# Patient Record
Sex: Female | Born: 1970 | Race: Black or African American | Hispanic: No | Smoking: Never smoker
Health system: Southern US, Community
[De-identification: ages and names within clinical notes are randomized; demographics above are authoritative.]

## PROBLEM LIST (undated history)

## (undated) DIAGNOSIS — F259 Schizoaffective disorder, unspecified: Principal | ICD-10-CM

## (undated) DIAGNOSIS — I10 Essential (primary) hypertension: Secondary | ICD-10-CM

## (undated) HISTORY — DX: Essential (primary) hypertension: I10

## (undated) HISTORY — DX: Schizoaffective disorder, unspecified: F25.9

---
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
## 2001-03-12 DIAGNOSIS — F259 Schizoaffective disorder, unspecified: Secondary | ICD-10-CM

## 2001-03-12 HISTORY — DX: Schizoaffective disorder, unspecified: F25.9

## 2002-10-06 ENCOUNTER — Emergency Department (HOSPITAL_COMMUNITY): Admission: EM | Admit: 2002-10-06 | Discharge: 2002-10-06 | Payer: Self-pay | Admitting: Emergency Medicine

## 2004-02-14 ENCOUNTER — Ambulatory Visit: Payer: Self-pay | Admitting: Family Medicine

## 2004-02-15 ENCOUNTER — Ambulatory Visit: Payer: Self-pay | Admitting: *Deleted

## 2004-03-20 ENCOUNTER — Ambulatory Visit (HOSPITAL_COMMUNITY): Admission: RE | Admit: 2004-03-20 | Discharge: 2004-03-20 | Payer: Self-pay | Admitting: Family Medicine

## 2004-10-31 ENCOUNTER — Inpatient Hospital Stay (HOSPITAL_COMMUNITY): Admission: AD | Admit: 2004-10-31 | Discharge: 2004-11-06 | Payer: Self-pay | Admitting: Psychiatry

## 2004-10-31 ENCOUNTER — Ambulatory Visit: Payer: Self-pay | Admitting: Psychiatry

## 2005-06-04 ENCOUNTER — Ambulatory Visit: Payer: Self-pay | Admitting: Family Medicine

## 2005-07-09 ENCOUNTER — Encounter (INDEPENDENT_AMBULATORY_CARE_PROVIDER_SITE_OTHER): Payer: Self-pay | Admitting: Family Medicine

## 2005-07-09 ENCOUNTER — Ambulatory Visit: Payer: Self-pay | Admitting: Family Medicine

## 2006-07-18 ENCOUNTER — Ambulatory Visit: Payer: Self-pay | Admitting: Family Medicine

## 2006-07-18 ENCOUNTER — Encounter (INDEPENDENT_AMBULATORY_CARE_PROVIDER_SITE_OTHER): Payer: Self-pay | Admitting: Family Medicine

## 2006-12-26 ENCOUNTER — Ambulatory Visit: Payer: Self-pay | Admitting: Internal Medicine

## 2008-04-20 ENCOUNTER — Ambulatory Visit: Payer: Self-pay | Admitting: Family Medicine

## 2008-04-20 LAB — CONVERTED CEMR LAB
Basophils Absolute: 0 10*3/uL (ref 0.0–0.1)
Basophils Relative: 1 % (ref 0–1)
CO2: 21 meq/L (ref 19–32)
Calcium: 9.6 mg/dL (ref 8.4–10.5)
Cholesterol: 244 mg/dL — ABNORMAL HIGH (ref 0–200)
Eosinophils Relative: 1 % (ref 0–5)
HCT: 39.7 % (ref 36.0–46.0)
HDL: 68 mg/dL (ref >39)
Lymphocytes Relative: 46 % (ref 12–46)
MCHC: 32.7 g/dL (ref 30.0–36.0)
Platelets: 352 10*3/uL (ref 150–400)
RDW: 13.5 % (ref 11.5–15.5)
Sodium: 140 meq/L (ref 135–145)
Total CHOL/HDL Ratio: 3.6
Triglycerides: 79 mg/dL (ref <150)

## 2008-07-29 ENCOUNTER — Encounter (INDEPENDENT_AMBULATORY_CARE_PROVIDER_SITE_OTHER): Payer: Self-pay | Admitting: Family Medicine

## 2008-07-29 ENCOUNTER — Ambulatory Visit: Payer: Self-pay | Admitting: Family Medicine

## 2008-08-04 ENCOUNTER — Ambulatory Visit (HOSPITAL_COMMUNITY): Admission: RE | Admit: 2008-08-04 | Discharge: 2008-08-04 | Payer: Self-pay | Admitting: Family Medicine

## 2008-12-08 ENCOUNTER — Ambulatory Visit: Payer: Self-pay | Admitting: Family Medicine

## 2008-12-08 LAB — CONVERTED CEMR LAB
HDL: 63 mg/dL (ref >39)
LDL Cholesterol: 153 mg/dL — ABNORMAL HIGH (ref 0–99)
Total CHOL/HDL Ratio: 3.7
VLDL: 17 mg/dL (ref 0–40)

## 2009-08-09 ENCOUNTER — Ambulatory Visit (HOSPITAL_COMMUNITY): Admission: RE | Admit: 2009-08-09 | Discharge: 2009-08-09 | Payer: Self-pay | Admitting: Family Medicine

## 2009-08-18 ENCOUNTER — Ambulatory Visit: Payer: Self-pay | Admitting: Psychiatry

## 2009-08-18 ENCOUNTER — Inpatient Hospital Stay (HOSPITAL_COMMUNITY): Admission: EM | Admit: 2009-08-18 | Discharge: 2009-08-26 | Payer: Self-pay | Admitting: Psychiatry

## 2009-08-18 ENCOUNTER — Emergency Department (HOSPITAL_COMMUNITY): Admission: EM | Admit: 2009-08-18 | Discharge: 2009-08-18 | Payer: Self-pay | Admitting: Emergency Medicine

## 2010-05-29 LAB — POCT I-STAT, CHEM 8
Creatinine, Ser: 1 mg/dL (ref 0.4–1.2)
Hemoglobin: 14.3 g/dL (ref 12.0–15.0)
Potassium: 4 mEq/L (ref 3.5–5.1)
Sodium: 138 mEq/L (ref 135–145)
TCO2: 18 mmol/L (ref 0–100)

## 2010-05-29 LAB — URINALYSIS, ROUTINE W REFLEX MICROSCOPIC
Ketones, ur: 15 mg/dL — AB
Nitrite: NEGATIVE
Protein, ur: NEGATIVE mg/dL
Urobilinogen, UA: 0.2 mg/dL (ref 0.0–1.0)

## 2010-05-29 LAB — CBC
HCT: 41.5 % (ref 36.0–46.0)
Platelets: 316 10*3/uL (ref 150–400)
RDW: 12.9 % (ref 11.5–15.5)
WBC: 3.9 10*3/uL — ABNORMAL LOW (ref 4.0–10.5)

## 2010-05-29 LAB — DIFFERENTIAL
Basophils Absolute: 0.1 10*3/uL (ref 0.0–0.1)
Eosinophils Absolute: 0 10*3/uL (ref 0.0–0.7)
Eosinophils Relative: 0 % (ref 0–5)
Lymphocytes Relative: 34 % (ref 12–46)
Lymphs Abs: 1.3 10*3/uL (ref 0.7–4.0)
Neutrophils Relative %: 55 % (ref 43–77)

## 2010-05-29 LAB — RAPID URINE DRUG SCREEN, HOSP PERFORMED
Amphetamines: NOT DETECTED
Benzodiazepines: NOT DETECTED
Cocaine: NOT DETECTED

## 2010-05-29 LAB — POCT PREGNANCY, URINE: Preg Test, Ur: NEGATIVE

## 2010-05-29 LAB — ETHANOL: Alcohol, Ethyl (B): <5 mg/dL (ref 0–10)

## 2010-05-29 LAB — URINE MICROSCOPIC-ADD ON

## 2010-07-12 ENCOUNTER — Other Ambulatory Visit (HOSPITAL_COMMUNITY): Payer: Self-pay | Admitting: Family Medicine

## 2010-07-12 DIAGNOSIS — Z1231 Encounter for screening mammogram for malignant neoplasm of breast: Secondary | ICD-10-CM
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
## 2010-08-14 ENCOUNTER — Ambulatory Visit (HOSPITAL_COMMUNITY)
Admission: RE | Admit: 2010-08-14 | Discharge: 2010-08-14 | Disposition: A | Discharge status: Home / Self Care | Payer: Self-pay | Source: Ambulatory Visit | Attending: Family Medicine | Admitting: Family Medicine

## 2010-08-14 DIAGNOSIS — Z1231 Encounter for screening mammogram for malignant neoplasm of breast: Secondary | ICD-10-CM | POA: Insufficient documentation

## 2010-09-26 ENCOUNTER — Other Ambulatory Visit: Payer: Self-pay | Admitting: Family Medicine

## 2012-01-03 ENCOUNTER — Encounter: Payer: Self-pay | Admitting: Family Medicine

## 2012-01-03 ENCOUNTER — Ambulatory Visit (INDEPENDENT_AMBULATORY_CARE_PROVIDER_SITE_OTHER): Payer: Self-pay | Admitting: Family Medicine

## 2012-01-03 VITALS — BP 164/101 | HR 99 | Temp 98.4°F | Ht 65.0 in | Wt 149.0 lb

## 2012-01-03 DIAGNOSIS — R030 Elevated blood-pressure reading, without diagnosis of hypertension: Secondary | ICD-10-CM

## 2012-01-03 DIAGNOSIS — Z83438 Family history of other disorder of lipoprotein metabolism and other lipidemia: Secondary | ICD-10-CM

## 2012-01-03 DIAGNOSIS — R9431 Abnormal electrocardiogram [ECG] [EKG]: Secondary | ICD-10-CM

## 2012-01-03 DIAGNOSIS — Z8349 Family history of other endocrine, nutritional and metabolic diseases: Secondary | ICD-10-CM

## 2012-01-03 DIAGNOSIS — F259 Schizoaffective disorder, unspecified: Secondary | ICD-10-CM

## 2012-01-03 DIAGNOSIS — E785 Hyperlipidemia, unspecified: Secondary | ICD-10-CM | POA: Insufficient documentation

## 2012-01-03 NOTE — Assessment & Plan Note (Signed)
I have asked patient to monitor at home or when she is at the drug store.  We will re-assess next visit, if it remains high will consider starting BP medications.

## 2012-01-03 NOTE — Assessment & Plan Note (Signed)
Per patient report well controlled on Risperdal, management per [facility].

## 2012-01-03 NOTE — Assessment & Plan Note (Signed)
Likely needs lipid panel due to family history, will plan on lab work next visit after she gets the orange card.

## 2012-01-03 NOTE — Progress Notes (Signed)
  Subjective:    Patient ID: Sandra Bell, female    DOB: 1970-11-29, 41 y.o.   MRN: [id number]  HPI  Sandra Bell comes in to establish care.  She used to see [facility] and needs a new doctor.  She is going to apply for the orange card as she does not have insurance.   She takes Risperdal for Schizoaffective disorder and is treated by [facility].  She says she is doing well and has been stable on this medication for a while.    She says she had an ECG done at some point that had some abnormalities, and they told her it looked like she might have had a hear attack in the past.  She denies ever having symptoms of a heart attack.  The doctor at that time started her on a baby aspirin daily.   She has never had problems with her blood pressure running high- but does admit to being nervous today about meeting a new doctor. Both her parents have high blood pressure.  She denies chest pain, dyspnea, palpitations, lower extremity edema.   Both her parents have high cholesterol, she does not remember the last time her cholesterol was checked.   Past Medical History  Diagnosis Date  . Schizoaffective disorder 2003   Family History  Problem Relation Age of Onset  . Hyperlipidemia Mother   . Hypertension Mother   . Hyperlipidemia Father   . Hypertension Father   . Heart disease Father    History  Substance Use Topics  . Smoking status: Never Smoker   . Smokeless tobacco: Never Used  . Alcohol Use: No     Review of Systems  Constitutional: Negative for unexpected weight change.  HENT: Negative for hearing loss.   Eyes: Negative for visual disturbance.  Respiratory: Negative for shortness of breath.   Cardiovascular: Negative for chest pain.  Gastrointestinal: Negative for abdominal pain.  Genitourinary: Negative for dysuria.  Musculoskeletal: Negative for arthralgias.  Skin: Negative for rash.  Neurological: Negative for headaches.  Hematological: Does not bruise/bleed easily.    Psychiatric/Behavioral: Negative for hallucinations.       Objective:   Physical Exam BP 164/101  Pulse 99  Temp 98.4 F (36.9 C) (Oral)  Ht 5\' 5"  (1.651 m)  Wt 149 lb (67.586 kg)  BMI 24.79 kg/m2  LMP 05/06/2011 General appearance: alert, cooperative and no distress Neck: supple, symmetrical, trachea midline and thyroid not enlarged, symmetric, no tenderness/mass/nodules Lungs: clear to auscultation bilaterally Heart: regular rate and rhythm, S1, S2 normal, no murmur, click, rub or gallop Extremities: extremities normal, atraumatic, no cyanosis or edema Pulses: 2+ and symmetric        Assessment & Plan:

## 2012-01-03 NOTE — Patient Instructions (Signed)
It was nice to meet you.  Your blood pressure today was BP: 164/101 mmHg.  Remember your goal blood pressure is about 130/80.  I am not sure if this is because you are nervous today, but we will need to monitor it to make sure you are not developing high blood pressure.    Please make an appointment to see me in about a month after you get the orange card.  If you can, make a morning appointment and come in fasting so we can check your cholesterol and other blood work.

## 2012-01-03 NOTE — Assessment & Plan Note (Signed)
Suspect this is a normal variant considering no medical history or symptoms.  Will consider repeating an ECG next visit.

## 2012-02-05 ENCOUNTER — Ambulatory Visit (INDEPENDENT_AMBULATORY_CARE_PROVIDER_SITE_OTHER): Payer: Self-pay | Admitting: Family Medicine

## 2012-02-05 VITALS — BP 175/123 | HR 109 | Ht 65.0 in | Wt 147.0 lb

## 2012-02-05 DIAGNOSIS — Z83438 Family history of other disorder of lipoprotein metabolism and other lipidemia: Secondary | ICD-10-CM

## 2012-02-05 DIAGNOSIS — Z8349 Family history of other endocrine, nutritional and metabolic diseases: Secondary | ICD-10-CM

## 2012-02-05 DIAGNOSIS — I10 Essential (primary) hypertension: Secondary | ICD-10-CM | POA: Insufficient documentation

## 2012-02-05 MED ORDER — HYDROCHLOROTHIAZIDE 12.5 MG PO CAPS: 12.5000 mg | ORAL_CAPSULE | Freq: Every day | ORAL | Status: DC

## 2012-02-05 NOTE — Progress Notes (Signed)
  Subjective:    Patient ID: Sandra Bell, female    DOB: [birth year], 41 y.o.   MRN: [id number]  HPI  Sandra Bell comes in for follow up.  She says she is doing well, and she is looking forward to the [place], she and her parents will have family in town for Thanksgiving.  She has not been able to get the orange card yet, but is on the waiting list.   HTN- she has never had high blood pressrue that she knows of, but both her parents have it and take medications.  She denies an chest pain, dyspnea, LE swelling, palpitations.  She has continued to do her regular exercise routine.   Past Medical History  Diagnosis Date  . Schizoaffective disorder 2003   Family History  Problem Relation Age of Onset  . Hyperlipidemia Mother   . Hypertension Mother   . Hyperlipidemia Father   . Hypertension Father   . Heart disease Father    History  Substance Use Topics  . Smoking status: Never Smoker   . Smokeless tobacco: Never Used  . Alcohol Use: No   Review of Systems See HPI    Objective:   Physical Exam BP 175/123  Pulse 109  Ht 5\' 5"  (1.651 m)  Wt 147 lb (66.679 kg)  BMI 24.46 kg/m2 General appearance: alert, cooperative and no distress Eyes: conjunctivae/corneas clear. PERRL, EOM's intact. Fundi benign. Neck: no adenopathy, no JVD and supple, symmetrical, trachea midline Lungs: clear to auscultation bilaterally Extremities: extremities normal, atraumatic, no cyanosis or edema       Assessment & Plan:

## 2012-02-05 NOTE — Assessment & Plan Note (Signed)
Plan on checking lipids when she gets the orange card, future orders in.

## 2012-02-05 NOTE — Assessment & Plan Note (Signed)
Repeat BP was 150/90.  Will start HCTZ, and check bmet when she is able to get the orange card.  F/U in 3 months.

## 2012-02-05 NOTE — Patient Instructions (Signed)
It was good to see you.  Your blood pressure is running high, it was high last visit too.  Today it was 150/85, the first reading was 175/123.  I think you need to start a blood pressure medication, I have sent a prescription to [facility] for HCTZ.  Please take it every morning.    For your lab work, once you get the [facility] card, call the office and ask to make a lab appointment.  Please come in fasting (you can drink water, black coffee, and brush your teeth).  I will send you a letter with your lab results, or call you if anything is abnormal.

## 2012-03-06 ENCOUNTER — Other Ambulatory Visit: Payer: No Typology Code available for payment source

## 2012-03-06 DIAGNOSIS — I10 Essential (primary) hypertension: Secondary | ICD-10-CM

## 2012-03-06 LAB — BASIC METABOLIC PANEL
BUN: 10 mg/dL (ref 6–23)
CO2: 28 mEq/L (ref 19–32)
Chloride: 102 mEq/L (ref 96–112)
Creat: 1.12 mg/dL — ABNORMAL HIGH (ref 0.50–1.10)
Glucose, Bld: 90 mg/dL (ref 70–99)
Potassium: 3.9 mEq/L (ref 3.5–5.3)

## 2012-03-06 LAB — LIPID PANEL
Cholesterol: 289 mg/dL — ABNORMAL HIGH (ref 0–200)
Triglycerides: 115 mg/dL (ref <150)
VLDL: 23 mg/dL (ref 0–40)

## 2012-03-06 NOTE — Progress Notes (Signed)
BMP AND FLP DONE TODAY Berea Majkowski 

## 2012-03-14 ENCOUNTER — Encounter: Payer: Self-pay | Admitting: Family Medicine

## 2012-03-14 ENCOUNTER — Telehealth: Payer: Self-pay | Admitting: Family Medicine

## 2012-03-14 MED ORDER — SIMVASTATIN 40 MG PO TABS: 40.0000 mg | ORAL_TABLET | Freq: Every day | ORAL | Status: DC

## 2012-03-14 NOTE — Telephone Encounter (Signed)
Called patient to notify her- Cholesterol was high, need to start cholesterol pill called simvastatin.  Reviewed importance of diet and exercise as well as medication side effects.  F/u in 2 months for next check up.

## 2012-05-23 ENCOUNTER — Encounter: Payer: Self-pay | Admitting: Family Medicine

## 2012-05-23 ENCOUNTER — Ambulatory Visit (INDEPENDENT_AMBULATORY_CARE_PROVIDER_SITE_OTHER): Payer: No Typology Code available for payment source | Admitting: Family Medicine

## 2012-05-23 VITALS — BP 137/93 | HR 92 | Ht 65.0 in | Wt 148.0 lb

## 2012-05-23 DIAGNOSIS — Z23 Encounter for immunization: Secondary | ICD-10-CM

## 2012-05-23 DIAGNOSIS — I10 Essential (primary) hypertension: Secondary | ICD-10-CM

## 2012-05-23 DIAGNOSIS — F259 Schizoaffective disorder, unspecified: Secondary | ICD-10-CM

## 2012-05-23 DIAGNOSIS — E785 Hyperlipidemia, unspecified: Secondary | ICD-10-CM

## 2012-05-23 LAB — LIPID PANEL
LDL Cholesterol: 98 mg/dL (ref 0–99)
VLDL: 17 mg/dL (ref 0–40)

## 2012-05-23 MED ORDER — TETANUS-DIPHTH-ACELL PERTUSSIS 5-2.5-18.5 LF-MCG/0.5 IM SUSP: 0.5000 mL | Freq: Once | INTRAMUSCULAR | Status: DC

## 2012-05-23 NOTE — Assessment & Plan Note (Signed)
Well controlled on minimal therapy- continue HCTZ.

## 2012-05-23 NOTE — Progress Notes (Signed)
  Subjective:    Patient ID: Sandra Bell, female    DOB: 1971/03/10, 42 y.o.   MRN: [id number]  HPI:  Sandra Bell comes in for follow up.    HLD: Last lipid profile: Lab Results  Component Value Date   CHOL 289* 03/06/2012   HDL 52 03/06/2012   LDLCALC 214* 03/06/2012   TRIG 115 03/06/2012   CHOLHDL 5.6 03/06/2012  Patient says she started taking the simvastatin, has not had any side effects from it.  She does sit ups every day but does not get much CV exercise.   HTN: Taking HCTZ without difficulty.  Denies chest pain, dizziness, palpitations, LE edema.  Patient does not check blood pressures.   Schizoaffective disorder: patient is taking Risperdal per [facility].  She denies any hallucinations, SI/HI, or other issues.  She is living with her mother, reports this is going well.    Past Medical History  Diagnosis Date  . Schizoaffective disorder 2003    History  Substance Use Topics  . Smoking status: Never Smoker   . Smokeless tobacco: Never Used  . Alcohol Use: No    Family History  Problem Relation Age of Onset  . Hyperlipidemia Mother   . Hypertension Mother   . Hyperlipidemia Father   . Hypertension Father   . Heart disease Father      ROS Pertinent items in HPI    Objective:  Physical Exam:  BP 137/93  Pulse 92  Ht 5\' 5"  (1.651 m)  Wt 148 lb (67.132 kg)  BMI 24.63 kg/m2 General appearance: alert, cooperative and no distress Head: Normocephalic, without obvious abnormality, atraumatic Lungs: clear to auscultation bilaterally Heart: regular rate and rhythm, S1, S2 normal, no murmur, click, rub or gallop Pulses: 2+ and symmetric       Assessment & Plan:

## 2012-05-23 NOTE — Assessment & Plan Note (Signed)
Patient well controlled - labs normal last visit.

## 2012-05-23 NOTE — Patient Instructions (Signed)
It was really nice seeing you today.  I will send you a letter with your lab results, or call you if we need to make any medication changes.   Your blood pressure today was BP: 137/93 mmHg.  Remember your goal blood pressure is about 140/90.  Please be sure to take your medication every day.    Please try to increase your activity and exercise, and avoid high cholesterol and fatty foods.

## 2012-05-23 NOTE — Assessment & Plan Note (Signed)
Re check lipids today after initiation of simvastatin.  Encouraged increased exercise and dietary changes.

## 2012-05-26 ENCOUNTER — Encounter: Payer: Self-pay | Admitting: Family Medicine

## 2012-07-07 ENCOUNTER — Other Ambulatory Visit: Payer: Self-pay | Admitting: Family Medicine

## 2012-08-11 ENCOUNTER — Other Ambulatory Visit: Payer: Self-pay | Admitting: Family Medicine

## 2012-11-20 ENCOUNTER — Encounter: Payer: Self-pay | Admitting: Family Medicine

## 2012-11-20 ENCOUNTER — Ambulatory Visit (INDEPENDENT_AMBULATORY_CARE_PROVIDER_SITE_OTHER): Payer: No Typology Code available for payment source | Admitting: Family Medicine

## 2012-11-20 VITALS — BP 132/88 | HR 81 | Temp 98.5°F | Wt 148.0 lb

## 2012-11-20 DIAGNOSIS — I10 Essential (primary) hypertension: Secondary | ICD-10-CM

## 2012-11-20 DIAGNOSIS — E785 Hyperlipidemia, unspecified: Secondary | ICD-10-CM

## 2012-11-20 DIAGNOSIS — F259 Schizoaffective disorder, unspecified: Secondary | ICD-10-CM

## 2012-11-20 NOTE — Progress Notes (Signed)
Patient ID: Sandra Bell, female   DOB: Oct 11, 1970, 42 y.o.   MRN: [id number] Subjective:   CC: Follow-up  HPI:   1. HTN - Taking HCTZ every morning with no dizzines, chest pain, SOB, headache, blurry vision. No major changes to urination.  Changed diet, eats lots more veggies (cheerios every morning), T/Th/Fri exercises (75 situps).   2. HLD - Taking simvastatin and aspirin daily, no medication side effects including leg cramping/muscle pains.  3. Schizoaffective disorder - Patient has felt stable for 2 years, when risperidone was increased to 2mg  daily. She denies any side effects to medication. Is seen at [facility] every 3 months with next visit in November. She hears voices giving her negative thoughts occasionally (saying "you are not doing well today") but has learned to adjust to it and has information about the psychiatric hotline, keeping the phone number in her wallet for emergencies. Denies depression/HI/SI.   Review of Systems - Per HPI.   SH: No tobacco, drugs, or etoh use Lives with parents in [place]. Stable place for her. No relationship, does not work, does chores around house  FH: Each parent with HTN   Objective:  Physical Exam BP 132/88  Pulse 81  Temp(Src) 98.5 F (36.9 C) (Oral)  Wt 148 lb (67.132 kg)  BMI 24.63 kg/m2 GEN: NAD, pleasant HEENT: Atraumatic, normocephalic, neck supple, EOMI, sclera clear  CV: RRR, no murmurs, rubs, or gallops PULM: CTAB, normal effort ABD: not distended SKIN: No rash or cyanosis; warm and well-perfused EXTR: No lower extremity edema or calf tenderness PSYCH: Mood and affect euthymic, normal rate and volume of speech NEURO: Awake, alert, no focal deficits grossly, normal speech    Assessment:     Sandra Bell is a 42 y.o. female with h/o schizoaffective disorder, HTN, and HLD here for follow-up.    Plan:     # See problem list for problem-specific plans. - Declined flu shot - Not sexually active but encouraged to  come in and discuss birth control if becomes sexually active - Not due for Pap smear (neg 09/2010) - Discuss mammogram at f/u

## 2012-11-20 NOTE — Patient Instructions (Addendum)
Great to meet you.  Your blood pressure is a little on the high side.  - I am checking labs today and will call you if anything is NOT normal. Otherwise, I will send you a letter. - Continue taking HCTZ.  - See if you have a bp cuff at home and call me with 2-3 resting bp values.  Come back in March for lab visit for lipids, and we can follow up after that with an office visit.  DASH Diet The DASH diet stands for "Dietary Approaches to Stop Hypertension." It is a healthy eating plan that has been shown to reduce high blood pressure (hypertension) in as little as 14 days, while also possibly providing other significant health benefits. These other health benefits include reducing the risk of breast cancer after menopause and reducing the risk of type 2 diabetes, heart disease, colon cancer, and stroke. Health benefits also include weight loss and slowing kidney failure in patients with chronic kidney disease.  DIET GUIDELINES  Limit salt (sodium). Your diet should contain less than 1500 mg of sodium daily.  Limit refined or processed carbohydrates. Your diet should include mostly whole grains. Desserts and added sugars should be used sparingly.  Include small amounts of heart-healthy fats. These types of fats include nuts, oils, and tub margarine. Limit saturated and trans fats. These fats have been shown to be harmful in the body. CHOOSING FOODS  The following food groups are based on a 2000 calorie diet. See your Registered Dietitian for individual calorie needs. Grains and Grain Products (6 to 8 servings daily)  Eat More Often: Whole-wheat bread, brown rice, whole-grain or wheat pasta, quinoa, popcorn without added fat or salt (air popped).  Eat Less Often: White bread, white pasta, white rice, cornbread. Vegetables (4 to 5 servings daily)  Eat More Often: Fresh, frozen, and canned vegetables. Vegetables may be raw, steamed, roasted, or grilled with a minimal amount of fat.  Eat Less  Often/Avoid: Creamed or fried vegetables. Vegetables in a cheese sauce. Fruit (4 to 5 servings daily)  Eat More Often: All fresh, canned (in natural juice), or frozen fruits. Dried fruits without added sugar. One hundred percent fruit juice ( cup [237 mL] daily).  Eat Less Often: Dried fruits with added sugar. Canned fruit in light or heavy syrup. [employer], Fish, and Poultry (2 servings or less daily. One serving is 3 to 4 oz [85-114 g]).  Eat More Often: Ninety percent or leaner ground beef, tenderloin, sirloin. Round cuts of beef, chicken breast, [place] breast. All fish. Grill, bake, or broil your meat. Nothing should be fried.  Eat Less Often/Avoid: Fatty cuts of meat, [place], or chicken leg, thigh, or wing. Fried cuts of meat or fish. Dairy (2 to 3 servings)  Eat More Often: Low-fat or fat-free milk, low-fat plain or light yogurt, reduced-fat or part-skim cheese.  Eat Less Often/Avoid: Milk (whole, 2%).Whole milk yogurt. Full-fat cheeses. Nuts, Seeds, and Legumes (4 to 5 servings per week)  Eat More Often: All without added salt.  Eat Less Often/Avoid: Salted nuts and seeds, canned beans with added salt. Fats and Sweets (limited)  Eat More Often: Vegetable oils, tub margarines without trans fats, sugar-free gelatin. Mayonnaise and salad dressings.  Eat Less Often/Avoid: Coconut oils, palm oils, butter, stick margarine, cream, half and half, cookies, candy, pie. FOR MORE INFORMATION The Dash Diet Eating Plan: www.dashdiet.org Document Released: 02/15/2011 Document Revised: 05/21/2011 Document Reviewed: 02/15/2011 [facility] Patient Information 2014 [place], [place].

## 2012-11-21 DIAGNOSIS — Z0000 Encounter for general adult medical examination without abnormal findings: Secondary | ICD-10-CM | POA: Insufficient documentation

## 2012-11-21 LAB — BASIC METABOLIC PANEL
CO2: 28 mEq/L (ref 19–32)
Chloride: 102 mEq/L (ref 96–112)
Creat: 1.1 mg/dL (ref 0.50–1.10)
Potassium: 3.5 mEq/L (ref 3.5–5.3)
Sodium: 137 mEq/L (ref 135–145)

## 2012-11-21 NOTE — Assessment & Plan Note (Signed)
Stable on risperidone 2mg  x 2 years. Follows up at [facility]. - Continue current therapy - Continue f/u at [facility] - Return precautions reviewed

## 2012-11-21 NOTE — Assessment & Plan Note (Signed)
Doing well on statin, no side effects. - Continue - Return in March for next lipid panel (placing future order)

## 2012-11-21 NOTE — Assessment & Plan Note (Addendum)
Relatively well-controlled on recheck. Taking HCTZ daily. - Continue HCTZ - BMET today WNL with Cr 1.1 and GFR >60 , normal K - Gave info on DASH diet, encouraged exercising for cardiovascular health - Check if BP cuff at home and call with 2-3 resting BP values - F/u in March

## 2012-11-28 ENCOUNTER — Encounter: Payer: Self-pay | Admitting: Family Medicine

## 2013-01-14 ENCOUNTER — Other Ambulatory Visit: Payer: Self-pay | Admitting: Family Medicine

## 2013-02-11 ENCOUNTER — Ambulatory Visit: Payer: No Typology Code available for payment source

## 2013-03-02 ENCOUNTER — Other Ambulatory Visit: Payer: Self-pay | Admitting: Family Medicine

## 2013-03-06 ENCOUNTER — Other Ambulatory Visit: Payer: Self-pay | Admitting: Family Medicine

## 2013-05-15 ENCOUNTER — Ambulatory Visit: Payer: Self-pay | Admitting: Family Medicine

## 2013-05-25 ENCOUNTER — Ambulatory Visit (INDEPENDENT_AMBULATORY_CARE_PROVIDER_SITE_OTHER): Payer: No Typology Code available for payment source | Admitting: Family Medicine

## 2013-05-25 ENCOUNTER — Encounter: Payer: Self-pay | Admitting: Family Medicine

## 2013-05-25 VITALS — BP 133/94 | HR 90 | Ht 65.0 in | Wt 149.0 lb

## 2013-05-25 DIAGNOSIS — I10 Essential (primary) hypertension: Secondary | ICD-10-CM

## 2013-05-25 DIAGNOSIS — E785 Hyperlipidemia, unspecified: Secondary | ICD-10-CM

## 2013-05-25 DIAGNOSIS — F259 Schizoaffective disorder, unspecified: Secondary | ICD-10-CM

## 2013-05-25 NOTE — Progress Notes (Signed)
Patient ID: Sandra Bell, female   DOB: 06/20/1970, 43 y.o.   MRN: [id number] Subjective:   CC: F/u HTN, cholesterol, and schizoaffective disorder  HPI:   HTN:  Last seen 11/20/12. Relatively well-controlled on daily HCTZ. No side effects. BP at home: does not check.  Symptoms: No CP/SOb/vision changes. No leg swelling. No headaches. No significant dizziness or fainting.   HLD:  Lipids elevated in Dec 2013, much improved 05/2012. Taking simvastatin daily with no side effects.   Schizoaffective disorder:  On risperdal 1mg  qhs (self-decreased from 2mg  due to dizziness in mid-feb 2015 and that is still controlling sx well). No SI/HI. No visual hallucinations. Occasional voices, controlled more than used to be, no change since decr dose. Thinks 1mg  is okay. Sees psychiatrist at [facility]. Next appt 3/31.  Before got on risperdal, voice was more frequent. It is an abstract sound. Does not command her to do things. More like bad thoughts.   Review of Systems - Per HPI. Additionally, would like to wait until 450s for mammogram.  FH:  -No breast ca in family -No FH of colon cancer.  PMH: -Med - decr risperdal to 1mg  qhs due to dizziness with 2mg .  Objective:  Physical Exam BP 133/94  Pulse 90  Ht 5\' 5"  (1.651 m)  Wt 149 lb (67.586 kg)  BMI 24.79 kg/m2 GEN: NAD HEENT: Atraumatic, normocephalic, neck supple, EOMI, sclera clear  CV: RRR, no murmurs, rubs, or gallops PULM: CTAB, normal effort SKIN: No rash or cyanosis; warm and well-perfused EXTR: No lower extremity edema or calf tenderness PSYCH: Mood and affect euthymic, normal rate and volume of speech, no SI/HI; no visual hallucinations, mild auditory hallucinations NEURO: Awake, alert, no focal deficits grossly, normal speech    Assessment:     Sandra Bell is a [age] y.o. female here for f/u of schizoaffective disorder, HTN, and HLD.    Plan:     # Health Maintenance:  - Due for pap 09/2013, would like to do in Sept. -  chol in sept and bmet. - Would like to wait until 250s for mammogram  Follow-up: Follow up in 6 mo for f/u of BP and HLD.   Leona SingletonMaria T Hartlyn Reigel, MD [facility]

## 2013-05-25 NOTE — Patient Instructions (Signed)
Good to see you.  Keep up the good work. If you are not exercising, add walking into your regular routine, 2-3 day weekly for 10-15 minutes each time. It is great stress-relief. Come back in Sept for follow up of your cholesterol and BP. If you come in the morning, you can get a fasting cholesterol lab drawn. Write down some BP values at home and call me with these.  Best.  Hilton Sinclair, MD   Exercise to Stay Healthy Exercise helps you become and stay healthy. EXERCISE IDEAS AND TIPS Choose exercises that:  You enjoy.  Fit into your day. You do not need to exercise really hard to be healthy. You can do exercises at a slow or medium level and stay healthy. You can:  Stretch before and after working out.  Try yoga, Pilates, or tai chi.  Lift weights.  Walk fast, swim, jog, run, climb stairs, bicycle, dance, or rollerskate.  Take aerobic classes. Exercises that burn about 150 calories:  Running 1  miles in 15 minutes.  Playing volleyball for 45 to 60 minutes.  Washing and waxing a car for 45 to 60 minutes.  Playing touch football for 45 minutes.  Walking 1  miles in 35 minutes.  Pushing a stroller 1  miles in 30 minutes.  Playing basketball for 30 minutes.  Raking leaves for 30 minutes.  Bicycling 5 miles in 30 minutes.  Walking 2 miles in 30 minutes.  Dancing for 30 minutes.  Shoveling snow for 15 minutes.  Swimming laps for 20 minutes.  Walking up stairs for 15 minutes.  Bicycling 4 miles in 15 minutes.  Gardening for 30 to 45 minutes.  Jumping rope for 15 minutes.  Washing windows or floors for 45 to 60 minutes. Document Released: 03/31/2010 Document Revised: 05/21/2011 Document Reviewed: 03/31/2010 [facility] Patient Information 2014 [place], [place].

## 2013-06-02 NOTE — Assessment & Plan Note (Addendum)
Controlled today. - will check if have cuff at home. Call with home BP values. - Continue HCTZ. Doesn't need refill. - Add walking into routine (a few days weekly). - Recheck BP and BMET in 6 mo.

## 2013-06-02 NOTE — Assessment & Plan Note (Addendum)
Previously uncontrolled with chol and LDL in 200s in Dec 2013, much improved 05/2012. Due for check. - Fasting lab visit for lipid panel - ordered as future. - Continue simvastatin.

## 2013-06-02 NOTE — Assessment & Plan Note (Signed)
Stable. - Continue f/u with psych planned end of this month. - Continue risperdal 1mg  qhs.

## 2013-08-11 ENCOUNTER — Other Ambulatory Visit: Payer: Self-pay | Admitting: Family Medicine

## 2013-09-28 ENCOUNTER — Ambulatory Visit: Payer: No Typology Code available for payment source

## 2013-11-10 ENCOUNTER — Ambulatory Visit: Payer: No Typology Code available for payment source | Admitting: Family Medicine

## 2013-11-20 ENCOUNTER — Ambulatory Visit (HOSPITAL_COMMUNITY)
Admission: RE | Admit: 2013-11-20 | Discharge: 2013-11-20 | Disposition: A | Discharge status: Home / Self Care | Payer: No Typology Code available for payment source | Source: Ambulatory Visit | Attending: Family Medicine | Admitting: Family Medicine

## 2013-11-20 ENCOUNTER — Ambulatory Visit (INDEPENDENT_AMBULATORY_CARE_PROVIDER_SITE_OTHER): Payer: No Typology Code available for payment source | Admitting: Family Medicine

## 2013-11-20 ENCOUNTER — Encounter: Payer: Self-pay | Admitting: Family Medicine

## 2013-11-20 ENCOUNTER — Other Ambulatory Visit (HOSPITAL_COMMUNITY)
Admission: RE | Admit: 2013-11-20 | Discharge: 2013-11-20 | Disposition: A | Discharge status: Home / Self Care | Payer: No Typology Code available for payment source | Source: Ambulatory Visit | Attending: Family Medicine | Admitting: Family Medicine

## 2013-11-20 VITALS — BP 137/90 | HR 130 | Temp 98.7°F | Ht 65.0 in | Wt 134.0 lb

## 2013-11-20 DIAGNOSIS — E785 Hyperlipidemia, unspecified: Secondary | ICD-10-CM | POA: Insufficient documentation

## 2013-11-20 DIAGNOSIS — Z124 Encounter for screening for malignant neoplasm of cervix: Secondary | ICD-10-CM | POA: Insufficient documentation

## 2013-11-20 DIAGNOSIS — F259 Schizoaffective disorder, unspecified: Secondary | ICD-10-CM | POA: Insufficient documentation

## 2013-11-20 DIAGNOSIS — I10 Essential (primary) hypertension: Secondary | ICD-10-CM | POA: Insufficient documentation

## 2013-11-20 DIAGNOSIS — Z1151 Encounter for screening for human papillomavirus (HPV): Secondary | ICD-10-CM | POA: Insufficient documentation

## 2013-11-20 DIAGNOSIS — R000 Tachycardia, unspecified: Secondary | ICD-10-CM

## 2013-11-20 DIAGNOSIS — Z01419 Encounter for gynecological examination (general) (routine) without abnormal findings: Secondary | ICD-10-CM | POA: Insufficient documentation

## 2013-11-20 NOTE — Progress Notes (Signed)
Patient ID: Mirha Brucato, female   DOB: [birth year], 43 y.o.   MRN: [id number] Subjective:   CC: Pap smear, F/u HTN and HLD  HPI:   Pap smear Patient would like this done today as we disussed it at last visit and she was due. She declines STD testing. She does not report weight loss or other concerns.  F/u HTN Takes HCTZ 12.5mg  daily. Checks BP at home at 110/70-90s. Denies chest pain or dyspnea. HOwever, reports episode of palpitations 1 month ago that caused her to stop her risperdal and simvastatin.  HLD Takes simvastatin  daily. Stopped 1 month ago due to palpitations and concern for medication cause.  Schizoaffective disorder Was taking risperdal due to hearing voices. After medication, voices decreased but did not disappear. One month ago, she stopped risperdal and simvastatin due to concern that it was causing her palpitations with no chest pain or dizziness. She states palpitations stopped, and voices have not increased. They are negative but do not command her to do things. She would like to stay off risperdal. She has appt with [facility] end of this month on the 30th.  Review of Systems - Per HPI.    SH: Has increased fruit and veg in diet Nonsmoker    Objective:  Physical Exam BP 137/90  Pulse 130  Temp(Src) 98.7 F (37.1 C) (Oral)  Ht  (1.651 m)  Wt 134 lb (60.782 kg)  BMI 22.30 kg/m2  LMP 11/17/2013 GEN: NAD CV: Tachycardic to 110s, regular rhythm, no m/r/g PULM: CTAB, normal effort Abd: S/NT/ND EXTR: No LE edema or calf tenderness GU: Normal appearing external vagina and vaginal vault, minimal to no discharge, cervix visualized and appears normal. Bimanual exam with small nabothian cyst at 1 o'clock position, normal-size and mobility of uterus and adnexae with no cervical motion tenderness. PSYCH: Mood and affect mildly anxious-appearing, mild negative AH that are not commanding, does not endorse SI/HI.     Assessment:     Mychal Durio is a 43  y.o. female with h/o schizoaffective disorder here for pap smear and with tachycardia and self-discontinuation of risperdal.    Plan:     HLD Pt stopped simvastatin 1 month ago. Lipids had appeared to improve while she was on it. Also has been on risperdal which increases risk for HLD, though off this for 1 month. - Lipid panel today.  F/u HTN Well-controlled at home, mildly increased in clinic. - Continue current regimen. - BMET today.  Tachycardia No chest pain, dyspnea, dizziness or syncope. Had some palpitation symptoms 1 month ago with no CP or SOB. Possible arrhythmia vs metabolic derangement vs dehydration vs anxiety. Anxiety most likely as HR improves in clinic to 108 from 130 with no other symptoms and pt stating she is nervous. No LE edema with WELLS score for PE 1.5 (only due to HR). Prior "abnormal" EKG though unable to find this. Note states possible evidence of previous ischemia. EKG in clinic with T wave inversions in V4 and V5 and many leads with nonspecific T wave abnormalities that are likely chronic with no current symptoms.  No obvious medication cause. Ventricular tachy or brady can occur with risperdal, though very rare. Withdrawal symptoms are rare - Continue aspirin - BMET - F/u in 1 week. EKG repeat and consider beta blocker if still tachycardic. - Return precautions reviewed. - Precepted with Dr Randolm Idol.  Schizoaffective disorder Pt stopped risperdal 1 month ago with no worsening of symptoms.  - Urged her to restart risperdal  to avoid worsening of symptoms of AH. She declines at this time. - Prolactin per [facility] request. Will fax result to them at [phone]. - F/u with [facility] 9/30 as schedule.d  # Health Maintenance:  - Pap smear today  Follow-up: Follow up in 1 week for f/u of HR.   Leona Singleton, MD [facility]

## 2013-11-20 NOTE — Patient Instructions (Addendum)
Good to see you.  For your BP, continue taking HCTZ daily and checking BP intermittently.  For your heart rate, we are checking an EKG. It shows mild abnormalities that I suspect were present on a prior EKG. Follow up about this in 1 week. If you have chest pain or trouble breathing in the meantime, seek immediate care.  For your cholesterol, we are checking your lab values.  For your schizoaffective disorder, I would urge you to discuss discontinuation of medication with your provider at [facility] and let me know what you decide. If the voices start worsening again, I would strongly urge restarting your medication at  at bedtime.   We are checking labs today and I will call you if any are NOT normal.  Leona Singleton, MD

## 2013-11-21 LAB — BASIC METABOLIC PANEL
BUN: 7 mg/dL (ref 6–23)
CALCIUM: 9.8 mg/dL (ref 8.4–10.5)
CO2: 24 mEq/L (ref 19–32)
Chloride: 101 mEq/L (ref 96–112)
Creat: 0.96 mg/dL (ref 0.50–1.10)
GLUCOSE: 81 mg/dL (ref 70–99)
Potassium: 4.1 mEq/L (ref 3.5–5.3)
SODIUM: 138 meq/L (ref 135–145)

## 2013-11-21 LAB — LIPID PANEL
CHOL/HDL RATIO: 6 ratio
CHOLESTEROL: 241 mg/dL — AB (ref 0–200)
HDL: 40 mg/dL (ref >39)
LDL Cholesterol: 177 mg/dL — ABNORMAL HIGH (ref 0–99)
TRIGLYCERIDES: 120 mg/dL (ref <150)
VLDL: 24 mg/dL (ref 0–40)

## 2013-11-21 LAB — PROLACTIN: Prolactin: 7.4 ng/mL

## 2013-11-22 DIAGNOSIS — R000 Tachycardia, unspecified: Secondary | ICD-10-CM | POA: Insufficient documentation

## 2013-11-22 NOTE — Assessment & Plan Note (Signed)
Well-controlled at home, mildly increased in clinic. - Continue current regimen. - BMET today.

## 2013-11-22 NOTE — Assessment & Plan Note (Signed)
Pt stopped risperdal 1 month ago with no worsening of symptoms.  - Urged her to restart risperdal to avoid worsening of symptoms of AH. She declines at this time. - Prolactin per [facility] request. Will fax result to them at [phone]. - F/u with [facility] 9/30 as schedule.d

## 2013-11-22 NOTE — Assessment & Plan Note (Signed)
Pt stopped simvastatin 1 month ago. Lipids had appeared to improve while she was on it. Also has been on risperdal which increases risk for HLD, though off this for 1 month. - Lipid panel today.

## 2013-11-22 NOTE — Assessment & Plan Note (Signed)
No chest pain, dyspnea, dizziness or syncope. Had some palpitation symptoms 1 month ago with no CP or SOB. Possible arrhythmia vs metabolic derangement vs dehydration vs anxiety. Anxiety most likely as HR improves in clinic to 108 from 130 with no other symptoms and pt stating she is nervous. No LE edema with WELLS score for PE 1.5 (only due to HR). Prior "abnormal" EKG though unable to find this. Note states possible evidence of previous ischemia. EKG in clinic with T wave inversions in V4 and V5 and many leads with nonspecific T wave abnormalities that are likely chronic with no current symptoms.  No obvious medication cause. Ventricular tachy or brady can occur with risperdal, though very rare. Withdrawal symptoms are rare - Continue aspirin - BMET - F/u in 1 week. EKG repeat and consider beta blocker if still tachycardic. - Return precautions reviewed. - Precepted with Dr Randolm Idol.

## 2013-11-24 ENCOUNTER — Telehealth: Payer: Self-pay | Admitting: Family Medicine

## 2013-11-24 LAB — CYTOLOGY - PAP

## 2013-11-24 NOTE — Telephone Encounter (Signed)
BLUE TEAM: Sandra Bell had requested we fax her recent prolactin result to [facility] at [phone]. Can you do this, and before doing it does she need to sign a release?  Thanks!  Leona Singleton, MD

## 2013-11-24 NOTE — Telephone Encounter (Signed)
LM for patient to call back.  She will need to come in and sign a release for [place] to fax results to [facility].  Please inform her of this when she comes in and we will fax results when it is signed. Rebekha Diveley,CMA

## 2013-11-26 ENCOUNTER — Telehealth: Payer: Self-pay | Admitting: Family Medicine

## 2013-11-26 NOTE — Telephone Encounter (Signed)
Calling patient re: her labs.   Prolactin, BMET, and pap smear are normal.  Lipid panel with 6.8% 10 year ASCVD risk; she should be on moderate-intensity statin. I would even consider high-intensity statin, especially if she goes back on risperdal which she self-discontinued recently. She prefers to stay off of the statin (and the risperdal) and recheck cholesterol in the future. She has been working hard on diet and exercise and we discussed specifics of limiting sweet foods and beverages and eating plenty of fresh fruits and vegetables. We also discussed importance of exercising at least 20 minutes 5 days weekly. She voices understanding and agreement with this plan. - Work on diet and exercise. - Recheck cholesterol in 6 months. - She would like to refrain from restarting statin at this time.  Schizoaffective d/o: Off risperdal. I discouraged her self-discontinuing it and encouraged restarting. She still prefers to remain off. - She also plans to refrain from restarting risperdal since she feels better off it, and will discuss with [facility] at her upcoming appt. - Monitor for worsening symptoms like increasing negative auditory hallucinations.  Leona Singleton, MD

## 2013-12-04 ENCOUNTER — Ambulatory Visit (HOSPITAL_COMMUNITY)
Admission: RE | Admit: 2013-12-04 | Discharge: 2013-12-04 | Disposition: A | Discharge status: Home / Self Care | Payer: No Typology Code available for payment source | Source: Ambulatory Visit | Attending: Family Medicine | Admitting: Family Medicine

## 2013-12-04 ENCOUNTER — Ambulatory Visit (INDEPENDENT_AMBULATORY_CARE_PROVIDER_SITE_OTHER): Payer: No Typology Code available for payment source | Admitting: Family Medicine

## 2013-12-04 ENCOUNTER — Encounter: Payer: Self-pay | Admitting: Family Medicine

## 2013-12-04 VITALS — BP 138/97 | HR 127 | Temp 98.6°F | Ht 65.0 in | Wt 131.0 lb

## 2013-12-04 DIAGNOSIS — R000 Tachycardia, unspecified: Secondary | ICD-10-CM

## 2013-12-04 DIAGNOSIS — F259 Schizoaffective disorder, unspecified: Secondary | ICD-10-CM

## 2013-12-04 NOTE — Assessment & Plan Note (Signed)
Stable. HR improved again today from 127 to 103 after sitting in exam room. EKG today with flattened T wave in III, normal in V5 and V6 which were previously flipped, likely normal variants and stable for pt. Most likely related to anxiety. Asymtomatic. - Wishes to not be on medication. Discussed we could consider SSRI if symptoms of anxiety severely worsen. - Continue with therapist at [facility] to help treat anxiety. - Continue exercise. - Seek immediate care with CP, dyspnea, or other concerns.

## 2013-12-04 NOTE — Patient Instructions (Signed)
Your heart rate again improved as the visit went on. This is likely related to anxiety. Your EKG in clinic was stable from previous. Seek immediate care if you start having palpitations, chest pain, dizziness, fainting, shortness of breath, or other concerns. Also seek care if you are having worsened symptoms of your schizoaffective disorder as I would like [place] to keep a close eye on this.  Follow up in 1-2 months to keep an eye on your schizoaffective and anxiety symptoms.  Best,  Leona Singleton, MD

## 2013-12-04 NOTE — Assessment & Plan Note (Signed)
Remains stable off risperdal. - Monitor closely. - Asked pt to discuss at [facility] appt in 1 week. - F/u 1-2 mo. - ROI signed by pt to send prolactin level to [employer].

## 2013-12-04 NOTE — Progress Notes (Signed)
Patient ID: Sandra Bell, female   DOB: December 02, 1970, 43 y.o.   MRN: [id number] Subjective:   CC: Follow up tachycardia  HPI:   43 y.o. female with tachycardia noted at last visit with EKG showing nonspecific T wave changes and inversion in V4 and V5, with only symptom being palpitations 1 month prior. Thought most likely anxiety and planned EKG today. Since that visit, she has not had new chest symptoms including chest pain, dyspnea, dizziness, palpitations, or other concerns. She is trying to live healthy lifestyle (increased exercise, lots of water, incorporating more fruit, eating fish). Wants to stay off any medication as stopping her risperdal has made her feel a higher energy level and just overall "better."   Review of Systems - Per HPI.   SH:  Denies tobacco, drugs, or alcohol use PMH: Stopped risperdal prior to last visit    Objective:  Physical Exam BP 138/97  Pulse 127  Temp(Src) 98.6 F (37 C) (Oral)  Ht  (1.651 m)  Wt 131 lb (59.421 kg)  BMI 21.80 kg/m2  LMP 11/17/2013 GEN: NAD CV: Mild tachycardia 100s, regular rhythm, no m/r/g, 2+ bilateral radial pulses PULM: CTAB, normal effort EXTR: No LE edema or calf tenderness PSYCH: Mood and affect mildly anxious appearing    Assessment:     Sandra Bell is a 43 y.o. female with h/o schizoaffective disorder here for f/u of tachycardia noted at last visit.    Plan:     F/u tachycardia Stable. HR improved again today from 127 to 103 after sitting in exam room. EKG today with flattened T wave in III, normal in V5 and V6 which were previously flipped, likely normal variants and stable for pt. Most likely related to anxiety. Asymtomatic. - Wishes to not be on medication. Discussed we could consider SSRI if symptoms of anxiety severely worsen. - Continue with therapist at [facility] to help treat anxiety. - Continue exercise. - Seek immediate care with CP, dyspnea, or other concerns.  F/u schizoaffective d/o Remains  stable off risperdal. - Monitor closely. - Asked pt to discuss at [facility] appt in 1 week. - F/u 1-2 mo. - ROI signed by pt to send prolactin level to [employer].  Precepted with Dr McDiarmid.  Follow-up: Follow up in 1-2 mo for f/u schizoaffective d/o.   Leona Singleton, MD [facility]

## 2014-01-18 ENCOUNTER — Encounter: Payer: Self-pay | Admitting: Family Medicine

## 2014-01-18 ENCOUNTER — Ambulatory Visit (INDEPENDENT_AMBULATORY_CARE_PROVIDER_SITE_OTHER): Payer: No Typology Code available for payment source | Admitting: Family Medicine

## 2014-01-18 VITALS — BP 126/70 | HR 139 | Temp 98.5°F | Wt 118.0 lb

## 2014-01-18 DIAGNOSIS — F259 Schizoaffective disorder, unspecified: Secondary | ICD-10-CM

## 2014-01-18 DIAGNOSIS — R000 Tachycardia, unspecified: Secondary | ICD-10-CM

## 2014-01-18 DIAGNOSIS — I10 Essential (primary) hypertension: Secondary | ICD-10-CM

## 2014-01-18 NOTE — Patient Instructions (Signed)
Good to see you!  Your schizoaffective disorder seems stable. If the voices are not becoming directive or bothering you and you would like to stay off risperdal, we can keep you off that. I do want you to follow up very regularly with me and your psychiatric provider at [facility]. Use the crisis line number if needed. Follow up with me in 2-3 months.  For your heart rate, it was better on recheck. Check this 3-4 times for me at home. Seek immediate care if you have chest pain or trouble breathing or dizziness.  Pamala Hurry can give you paperwork to apply for [employer]  up front.  Best,  Hilton Sinclair, MD  Stress and Stress Management Stress is a normal reaction to life events. It is what you feel when life demands more than you are used to or more than you can handle. Some stress can be useful. For example, the stress reaction can help you catch the last bus of the day, study for a test, or meet a deadline at work. But stress that occurs too often or for too long can cause problems. It can affect your emotional health and interfere with relationships and normal daily activities. Too much stress can weaken your immune system and increase your risk for physical illness. If you already have a medical problem, stress can make it worse. CAUSES  All sorts of life events may cause stress. An event that causes stress for one person may not be stressful for another person. Major life events commonly cause stress. These may be positive or negative. Examples include losing your job, moving into a new home, getting married, having a baby, or losing a loved one. Less obvious life events may also cause stress, especially if they occur day after day or in combination. Examples include working long hours, driving in traffic, caring for children, being in debt, or being in a difficult relationship. SIGNS AND SYMPTOMS Stress may cause emotional symptoms including, the following:  Anxiety. This  is feeling worried, afraid, on edge, overwhelmed, or out of control.  Anger. This is feeling irritated or impatient.  Depression. This is feeling sad, down, helpless, or guilty.  Difficulty focusing, remembering, or making decisions. Stress may cause physical symptoms, including the following:   Aches and pains. These may affect your head, neck, back, stomach, or other areas of your body.  Tight muscles or clenched jaw.  Low energy or trouble sleeping. Stress may cause unhealthy behaviors, including the following:   Eating to feel better (overeating) or skipping meals.  Sleeping too little, too much, or both.  Working too much or putting off tasks (procrastination).  Smoking, drinking alcohol, or using drugs to feel better. DIAGNOSIS  Stress is diagnosed through an assessment by your health care provider. Your health care provider will ask questions about your symptoms and any stressful life events.Your health care provider will also ask about your medical history and may order blood tests or other tests. Certain medical conditions and medicine can cause physical symptoms similar to stress. Mental illness can cause emotional symptoms and unhealthy behaviors similar to stress. Your health care provider may refer you to a mental health professional for further evaluation.  TREATMENT  Stress management is the recommended treatment for stress.The goals of stress management are reducing stressful life events and coping with stress in healthy ways.  Techniques for reducing stressful life events include the following:  Stress identification. Self-monitor for stress and identify what causes stress for you. These  skills may help you to avoid some stressful events.  Time management. Set your priorities, keep a calendar of events, and learn to say "no." These tools can help you avoid making too many commitments. Techniques for coping with stress include the following:  Rethinking the  problem. Try to think realistically about stressful events rather than ignoring them or overreacting. Try to find the positives in a stressful situation rather than focusing on the negatives.  Exercise. Physical exercise can release both physical and emotional tension. The key is to find a form of exercise you enjoy and do it regularly.  Relaxation techniques. These relax the body and mind. Examples include yoga, meditation, tai chi, biofeedback, deep breathing, progressive muscle relaxation, listening to music, being out in nature, journaling, and other hobbies. Again, the key is to find one or more that you enjoy and can do regularly.  Healthy lifestyle. Eat a balanced diet, get plenty of sleep, and do not smoke. Avoid using alcohol or drugs to relax.  Strong support network. Spend time with family, friends, or other people you enjoy being around.Express your feelings and talk things over with someone you trust. Counseling or talktherapy with a mental health professional may be helpful if you are having difficulty managing stress on your own. Medicine is typically not recommended for the treatment of stress.Talk to your health care provider if you think you need medicine for symptoms of stress. HOME CARE INSTRUCTIONS  Keep all follow-up visits as directed by your health care provider.  Take all medicines as directed by your health care provider. SEEK MEDICAL CARE IF:  Your symptoms get worse or you start having new symptoms.  You feel overwhelmed by your problems and can no longer manage them on your own. SEEK IMMEDIATE MEDICAL CARE IF:  You feel like hurting yourself or someone else. Document Released: 08/22/2000 Document Revised: 07/13/2013 Document Reviewed: 10/21/2012 [facility] Patient Information 2015 [place], [place]. This information is not intended to replace advice given to you by your health care provider. Make sure you discuss any questions you have with your health care  provider.

## 2014-01-18 NOTE — Assessment & Plan Note (Addendum)
Still asymptomatic and likely elevated due to anxiety of coming into clinic given recheck decreased from 139 to 98 bpm. Prior EKG x2, 2nd stable compared to 1st. - Declines beta blocker. - check at home and call with 3 values. - Again discussed managing stress. - Return precautions reviewed

## 2014-01-18 NOTE — Assessment & Plan Note (Signed)
Stable, remains off risperdal and does not want to restart, stating she feels relaxed off of it with better energy. Sandra Bell has seen her and per patient they are also okay with this with very close f/u. - f/u with [facility] regularly. - has crisis line numbers. Reviewed reasons to call. - F/u with me q2-3 months to check in.

## 2014-01-18 NOTE — Progress Notes (Signed)
Patient ID: Lin Hackmann, female   DOB: [birth year], 43 y.o.   MRN: [id number] Subjective:   CC: Follow up schizoaffective disorder  HPI:   Tachycardia No palpitations. No dyspnea, chest pain, dizziness or fainting. EKG last visit with flattened T waves in III, likely normal variant stable for pt.   Schizoaffective disorder Last visit, was off risperdal. Feels like she has been doing very well off risperdal. Feels better rested and energy level is back to how had normally felt off of it.  AH and VH have not worsened from how had been on risperdal - hears voices twice a week, randomly throughout the day those days. Mostly negative but occasionally positive thoughts, more than once voice, though she does not recognize the voice. No longer upsets her, because she keeps herself busy and ignores. Never becomes directive. No feelings of depression. Had met with [facility] 1 week later and we planned to f/u today. Provider there aware and wants to f/u with her - has appt tomorrow. Gave her emergency number if in crisis and nurse's contact number. They are okay with her being off risperdal.  HTN Taking HCTZ regularly. Usually values slightly elevated in clinic, then improve.  Review of Systems - Per HPI.   Meds: reviewed; only taking aspirin and HCTZ Smoking status: Never smoker    Objective:  Physical Exam BP 142/96 mmHg  Pulse 139  Temp(Src) 98.5 F (36.9 C) (Oral)  Wt 118 lb (53.524 kg)  Recheck 126/70 GEN: NAD CV: RRR, no m/r/g, 1+ B radial pulses, recheck 98 bpm PULM: CTAB, normal effort ABD: S/NT/ND EXTR: No LE edema or calf tenderness PSYCH: Mood and affect euthymic to mildly anxious, no SI/HI, occasional AH 2x/week     Assessment:     Amylynn Fano is a 43 y.o. female with h/o schizoaffective disorder here for follow up of this and tachycardia.    Plan:     # See problem list and after visit summary for problem-specific plans.   # Health Maintenance: Not discussed  today - Gave information on applying for [facility].  Follow-up: Follow up in 2 months for f/u of schizoaffective d/o.   Hilton Sinclair, MD [place]

## 2014-01-18 NOTE — Assessment & Plan Note (Addendum)
Better on recheck to 126/70 from 142/96. Likely related to anxiety of coming in to office. Normal kidney function in Sept. - Continue HCTZ. - Resistant to starting other medication. - Follow.

## 2014-01-22 ENCOUNTER — Telehealth: Payer: Self-pay | Admitting: Family Medicine

## 2014-01-22 NOTE — Telephone Encounter (Signed)
Pt called because Dr. Karie Schwalbe was wanting 4 days of her Pulse readings. 11/9 = 89, 11/10 = 86, 11/11 = 86, 11/12 = 82. Caren MacadamJacqueline L Warner

## 2014-01-23 NOTE — Telephone Encounter (Signed)
These look good. Further evidence that tachycardia may be related to anxiety of coming into clinic. Will send pt letter stating this.  Thx,  Leona SingletonMaria T Imogine Carvell, MD PGY-3, [facility]

## 2014-02-09 ENCOUNTER — Other Ambulatory Visit: Payer: Self-pay | Admitting: Family Medicine

## 2014-03-24 ENCOUNTER — Ambulatory Visit: Payer: No Typology Code available for payment source

## 2014-03-25 ENCOUNTER — Ambulatory Visit: Payer: No Typology Code available for payment source

## 2014-03-29 ENCOUNTER — Encounter: Payer: Self-pay | Admitting: Family Medicine

## 2014-03-29 ENCOUNTER — Ambulatory Visit (INDEPENDENT_AMBULATORY_CARE_PROVIDER_SITE_OTHER): Payer: No Typology Code available for payment source | Admitting: Family Medicine

## 2014-03-29 VITALS — BP 138/90 | HR 108 | Temp 98.3°F | Ht 65.0 in | Wt 121.0 lb

## 2014-03-29 DIAGNOSIS — I10 Essential (primary) hypertension: Secondary | ICD-10-CM

## 2014-03-29 DIAGNOSIS — F258 Other schizoaffective disorders: Secondary | ICD-10-CM

## 2014-03-29 NOTE — Assessment & Plan Note (Signed)
Well-controlled on HCTZ. Not overweight. Kidney function normal in Sept 2015. - Continue with healthy diet and keeping salt to low. Printed DASH diet recommendations. - Add on 1-2 days of cardio workout. - F/u 3 mo.

## 2014-03-29 NOTE — Patient Instructions (Signed)
Good to see you today.  I am glad things are going so well. Continue exercising regularly/staying active and eating healthy. I will print general healthy eating tips below. Follow up with me in April, or sooner if needed for any reason. Best! Leona SingletonMaria T Almina Schul, MD    DASH Eating Plan DASH stands for "Dietary Approaches to Stop Hypertension." The DASH eating plan is a healthy eating plan that has been shown to reduce high blood pressure (hypertension). Additional health benefits may include reducing the risk of type 2 diabetes mellitus, heart disease, and stroke. The DASH eating plan may also help with weight loss. WHAT DO I NEED TO KNOW ABOUT THE DASH EATING PLAN? For the DASH eating plan, you will follow these general guidelines:  Choose foods with a percent daily value for sodium of less than 5% (as listed on the food label).  Use salt-free seasonings or herbs instead of table salt or sea salt.  Check with your health care provider or pharmacist before using salt substitutes.  Eat lower-sodium products, often labeled as "lower sodium" or "no salt added."  Eat fresh foods.  Eat more vegetables, fruits, and low-fat dairy products.  Choose whole grains. Look for the word "whole" as the first word in the ingredient list.  Choose fish and skinless chicken or Mal[place]key more often than red meat. Limit fish, poultry, and meat to 6 oz (170 g) each day.  Limit sweets, desserts, sugars, and sugary drinks.  Choose heart-healthy fats.  Limit cheese to 1 oz (28 g) per day.  Eat more home-cooked food and less restaurant, buffet, and fast food.  Limit fried foods.  Cook foods using methods other than frying.  Limit canned vegetables. If you do use them, rinse them well to decrease the sodium.  When eating at a restaurant, ask that your food be prepared with less salt, or no salt if possible. WHAT FOODS CAN I EAT? Seek help from a dietitian for individual calorie  needs. Grains Whole grain or whole wheat bread. Brown rice. Whole grain or whole wheat pasta. Quinoa, bulgur, and whole grain cereals. Low-sodium cereals. Corn or whole wheat flour tortillas. Whole grain cornbread. Whole grain crackers. Low-sodium crackers. Vegetables Fresh or frozen vegetables (raw, steamed, roasted, or grilled). Low-sodium or reduced-sodium tomato and vegetable juices. Low-sodium or reduced-sodium tomato sauce and paste. Low-sodium or reduced-sodium canned vegetables.  Fruits All fresh, canned (in natural juice), or frozen fruits. Meat and Other Protein Products Ground beef (85% or leaner), grass-fed beef, or beef trimmed of fat. Skinless chicken or [place]urkey. Ground chicken or [place]urkey. Pork trimmed of fat. All fish and seafood. Eggs. Dried beans, peas, or lentils. Unsalted nuts and seeds. Unsalted canned beans. Dairy Low-fat dairy products, such as skim or 1% milk, 2% or reduced-fat cheeses, low-fat ricotta or cottage cheese, or plain low-fat yogurt. Low-sodium or reduced-sodium cheeses. Fats and Oils Tub margarines without trans fats. Light or reduced-fat mayonnaise and salad dressings (reduced sodium). Avocado. Safflower, olive, or canola oils. Natural peanut or almond butter. Other Unsalted popcorn and pretzels. The items listed above may not be a complete list of recommended foods or beverages. Contact your dietitian for more options. WHAT FOODS ARE NOT RECOMMENDED? Grains White bread. White pasta. White rice. Refined cornbread. Bagels and croissants. Crackers that contain trans fat. Vegetables Creamed or fried vegetables. Vegetables in a cheese sauce. Regular canned vegetables. Regular canned tomato sauce and paste. Regular tomato and vegetable juices. Fruits Dried fruits. Canned fruit in light or heavy syrup.  Fruit juice. Meat and Other Protein Products Fatty cuts of meat. Ribs, chicken wings, bacon, sausage, bologna, salami, chitterlings, fatback, hot dogs, bratwurst,  and packaged luncheon meats. Salted nuts and seeds. Canned beans with salt. Dairy Whole or 2% milk, cream, half-and-half, and cream cheese. Whole-fat or sweetened yogurt. Full-fat cheeses or blue cheese. Nondairy creamers and whipped toppings. Processed cheese, cheese spreads, or cheese curds. Condiments Onion and garlic salt, seasoned salt, table salt, and sea salt. Canned and packaged gravies. Worcestershire sauce. Tartar sauce. Barbecue sauce. Teriyaki sauce. Soy sauce, including reduced sodium. Steak sauce. Fish sauce. Oyster sauce. Cocktail sauce. Horseradish. Ketchup and mustard. Meat flavorings and tenderizers. Bouillon cubes. Hot sauce. Tabasco sauce. Marinades. Taco seasonings. Relishes. Fats and Oils Butter, stick margarine, lard, shortening, ghee, and bacon fat. Coconut, palm kernel, or palm oils. Regular salad dressings. Other Pickles and olives. Salted popcorn and pretzels. The items listed above may not be a complete list of foods and beverages to avoid. Contact your dietitian for more information. WHERE CAN I FIND MORE INFORMATION? National Heart, Lung, and Blood Institute: travelstabloid.com Document Released: 02/15/2011 Document Revised: 07/13/2013 Document Reviewed: 12/31/2012 [facility] Patient Information 2015 [place], [place]. This information is not intended to replace advice given to you by your health care provider. Make sure you discuss any questions you have with your health care provider.

## 2014-03-29 NOTE — Assessment & Plan Note (Signed)
Stable, well-adjusted. Prefers to remain off medication.  - [facility] - F/u April or sooner PRN; no longer seeing [facility] so will need regular follow ups here q3-4 mo and pt amenable.

## 2014-03-29 NOTE — Progress Notes (Signed)
Patient ID: Sandra Bell, female   DOB: 07/28/1970, 43 y.o.   MRN: [id number] Subjective:   CC: F/u schizoaffective d/o and HTN  HPI:   F/u schizoaffective disorder Has been off risperdal for months by patient choice. Doing well.  Seen by [facility] for the last visit in November and nurse gave her emergency line and her contact numbers and told her she does not need to be seen regularly anymore. She was seeing 'medicine management' there.  Denies paranoia. Has same voices she has chronically had. They are tolerable - mixture of positive and negative voices.   Hypertension Taking HCTZ every monring. No chest pain, dyspnea, headaches, dizziness, fainting, or leg swelling. Changed to brown rice and has increased vegetables. Has certain errands she runs and attends bible study and church Sunday - stays active. Exercises in the house. Calisthenics for about 20 minutes per week.  Insurance coverage Met with Sandra Bell, renewed medical insurance, good for 6 mo.   Review of Systems - Per HPI.   PMH - tachycardia, schizoaffective disorder, HLD, HTN    Objective:  Physical Exam BP 138/90 mmHg  Pulse 108  Temp(Src) 98.3 F (36.8 C) (Oral)  Ht 5' 5" (1.651 m)  Wt 121 lb (54.885 kg)  BMI 20.14 kg/m2  LMP 02/27/2014 GEN: NAD CV: RRR, no m/r/g, 2+ Bilateral radial pulses PULM: CTAB, normal effort ABD: S/NT/ND EXTR: No LE edema or calf tenderness PSYCH: Mood and affect euthymic, stable auditory hallucinations that say positive and negative things about her, no paranoia; thought linear and goal-directed    Assessment:     Sandra Bell is a 43 y.o. female here for f/u of schizoaffective disorder and HTN.    Plan:     # See problem list and after visit summary for problem-specific plans. - Has insurance coverage now through [facility] [employer]. Discuss with Sandra Bell before insurance expires.  # Health Maintenance: Not due.  Follow-up: Follow up in April for f/u  schizoaffective, BP, and cholesterol.     T , MD [facility]   

## 2014-05-24 ENCOUNTER — Telehealth: Payer: Self-pay | Admitting: Family Medicine

## 2014-05-24 NOTE — Telephone Encounter (Signed)
Spoke with pt and she stated that was fine to do. Rayonna Heldman, CMA.

## 2014-05-24 NOTE — Telephone Encounter (Signed)
I can definitely work on this and it will likely be ready tomorrow late morning. Does that work for her?  Sandra SingletonMaria T Denyce Harr, MD

## 2014-05-24 NOTE — Telephone Encounter (Signed)
Ms. Katrinka BlazingSmith need a letter excusing her from jury duty.  Please call when ready for pickup.

## 2014-05-25 ENCOUNTER — Encounter: Payer: Self-pay | Admitting: Family Medicine

## 2014-05-25 NOTE — Telephone Encounter (Signed)
Please call to let her know I have left letter up front.  Leona SingletonMaria T Beaux Wedemeyer, MD

## 2014-05-25 NOTE — Telephone Encounter (Signed)
Pt is aware.  Frans Valente,CMA  

## 2014-05-25 NOTE — Progress Notes (Unsigned)
Patient came in to pick up letter to excuse her from jury duty, however, the letter didn't provide an explanation. The letter must provide the explanation as to why she must be excused. Pt explained to me that she used to take medication for Schitzo-effective disorder and she came off the medication about 6 months ago in September. She is concerned about jury duty because she doesn't want to feel overwhelmed during the weaning process from the medication. Please contact @ [phone] when the correct letter is ready for pu, she needs this before the end of month / thanks sr

## 2014-05-26 ENCOUNTER — Encounter: Payer: Self-pay | Admitting: Family Medicine

## 2014-05-26 NOTE — Progress Notes (Signed)
I have rewritten it to state this and called patient to review with her. I am printing and leaving up front and she is aware she can come by and pick it up when convenient.  Leona SingletonMaria T Rana Adorno, MD

## 2014-07-05 ENCOUNTER — Ambulatory Visit (INDEPENDENT_AMBULATORY_CARE_PROVIDER_SITE_OTHER): Payer: Self-pay | Admitting: Family Medicine

## 2014-07-05 ENCOUNTER — Encounter: Payer: Self-pay | Admitting: Family Medicine

## 2014-07-05 VITALS — BP 132/89 | HR 104 | Temp 98.6°F | Ht 65.0 in | Wt 112.2 lb

## 2014-07-05 DIAGNOSIS — Z0000 Encounter for general adult medical examination without abnormal findings: Secondary | ICD-10-CM

## 2014-07-05 DIAGNOSIS — E785 Hyperlipidemia, unspecified: Secondary | ICD-10-CM

## 2014-07-05 DIAGNOSIS — R000 Tachycardia, unspecified: Secondary | ICD-10-CM

## 2014-07-05 DIAGNOSIS — F258 Other schizoaffective disorders: Secondary | ICD-10-CM

## 2014-07-05 NOTE — Assessment & Plan Note (Signed)
Pt prefers to remain off medication but is working on diet/exercise. - Provided information about carbohydrate intake. - Encouraged increased exercise.

## 2014-07-05 NOTE — Assessment & Plan Note (Signed)
BP checked at home always 110-120s systolic. Likely white coat HTN. - Continue to monitor.

## 2014-07-05 NOTE — Assessment & Plan Note (Signed)
Pulse checked at home always 80-90s. Likely anxiety in clinic. - Continue to monitor.

## 2014-07-05 NOTE — Assessment & Plan Note (Signed)
Feels good off risperdal. Functioning well and side effects that had bothered her have resolved. Discussed benefit of antipsychotic to help prevent progression even in well-functioning individual. Still prefers no medication. - Discussed option of omega-3s that have some evidence of benefit in schizoaffective disorder/schizophrenia. - F/u q2-3 months to check in.

## 2014-07-05 NOTE — Patient Instructions (Signed)
Great to see you.  You will be due for colonoscopy and mammogram at 48. If for any reason you would l ike to do the mammogram sooner that is fine. You are actually only due for recheck of your cholesterol in September. Insurance may not cover it sooner than that. At that time, we can also recheck basic labs and HIV/syphillis screening. If possible, increase cardio exercise during the week to keep your heart healthy. You are NOT diabetic, but below is the diabetic diet that discusses carbohydrates. Go to the dentist and get your eyes checked annually. Try intake of fish or omega 3 fatty acid pills as there is some evidence this may help progression to worsened schizoaffective symptoms. Follow up here every 3 months for a check-in but otherwise as needed.  Best,  Sandra SingletonMaria T Jrue Yambao, MD   Diet Recommendations for Diabetes   Starchy (carb) foods include: Bread, rice, pasta, potatoes, corn, crackers, bagels, muffins, all baked goods.  (Fruits, milk, and yogurt also have carbohydrate, but most of these foods will not spike your blood sugar as the starchy foods will.)  A few fruits do cause high blood sugars; use small portions of bananas (limit to 1/2 at a time), grapes, and most tropical fruits.    Protein foods include: Meat, fish, poultry, eggs, dairy foods, and beans such as pinto and kidney beans (beans also provide carbohydrate).   1. Eat at least 3 meals and 1-2 snacks per day. Never go more than 4-5 hours while awake without eating.  2. Limit starchy foods to TWO per meal and ONE per snack. ONE portion of a starchy  food is equal to the following:   - ONE slice of bread (or its equivalent, such as half of a hamburger bun).   - 1/2 cup of a "scoopable" starchy food such as potatoes or rice.   - 15 grams of carbohydrate as shown on food label.  3. Both lunch and dinner should include a protein food, a carb food, and vegetables.   - Obtain twice as many veg's as protein or carbohydrate  foods for both lunch and dinner.   - Fresh or frozen veg's are best.   - Try to keep frozen veg's on hand for a quick vegetable serving.    4. Breakfast should always include protein.

## 2014-07-05 NOTE — Assessment & Plan Note (Addendum)
Doing well, no concerns today. - Colonoscopy and mammogram recommended at 48 - HIV/RPR in Sept with lipid panel. Declines GC/Chlamydia. - Next pap smear no later than 11/2016. - Dental visit annually recommended. - Increase walking / cardio exercise. - Ev Oct for flu shot.

## 2014-07-05 NOTE — Progress Notes (Signed)
Patient ID: Sandra LawsStephanie Bell, female   DOB: 1970/09/05, 44 y.o.   MRN: [id number] Subjective:   CC: Health maintenance  HPI:   As of 07/05/2014  Concerns today: None Colonoscopy: No FH colon cancer. Mammogram: mammograms have been normal, most recently ~39. No FH of breast or endometrial cancer. Sexually active: Not dating at this time and not sexually active. LMP: Ended 4/23, typically normal; had stopped with risperdal; returned once she was off risperdal Contraception: None STD check: Declines - does not feel at risk. Pap smears: Last was normal 11/2013 Pelvic symptoms: No pelvic pain or abnormal vaginal discharge Lipids: Was taking cholesterol medicine and risperdal, stopped due to pt preference. Diet - has been focusing on low or no cholesterol. Depression screen: None Falls risk assessment: None TDAP, Zostavax, pneumococcal, influenza: Had TDAP here ~2-3 years ago. Declined flu shot this year Regular eye checkups: No Dental: Not having regular visits. Review of weight: Stable Exercise: Calisthenics 2x/week; ADLs. Diet: Trying to control cholesterol intake. BP: and pulse: 70s-90s, 100s-120s/80 at home Review of PMH: tachycardia, HTN, HLD, schizoaffective d/o   Review of Systems - Per HPI.     Smoking status: H/o tobacco use in 30s x 3 years ~1 cigar/week Alcohol: None Drugs: None  Objective:  Physical Exam BP 132/89 mmHg  Pulse 104  Temp(Src) 98.6 F (37 C) (Oral)  Ht 5\' 5"  (1.651 m)  Wt 112 lb 3 oz (50.888 kg)  BMI 18.67 kg/m2  LMP 06/29/2014 GEN: NAD CV: RRR, no m/r/g, 2+ B radial pulses PULM: CTAB, normal effort ABD: S/NT/ND, NABS EXTR: No LE edema or calf tenderness HEENT: AT/[place], sclera clear, EOMI, no thyromegaly, o/p clear with MMM    Assessment:     Sandra Bell is a 44 y.o. female here for well woman visit.    Plan:     # See problem list and after visit summary for problem-specific plans.   # Health Maintenance:   Follow-up: Follow up every  2-3 months for regular f/u of schizoaffective disorder.   Leona SingletonMaria T Lajoy Vanamburg, MD [facility]

## 2014-07-07 ENCOUNTER — Telehealth: Payer: Self-pay | Admitting: Clinical

## 2014-07-07 NOTE — Telephone Encounter (Signed)
CSW received a call from pts mother and father who expressed concern with pt being off of her risperdal. Mother reports pt has been laughing to her self, increasingly irritated however denied her showing suicidal or homicidal ideation. CSW informed mother that because pt is not showing SI/HI there is not anything that can be done to force treatment/medication. CSW encouraged pts parents to encourage the pt to return to see her psychiatrist. Parents informed to call 911 in the event that pt expresses SI/HI, and family given the number for mobile crisis if concerns arise. Parents receptive and appreciative of support.  Sandra Bell, MSW, LCSW [phone]

## 2014-07-08 NOTE — Telephone Encounter (Signed)
Thank you Sandra BushNorma. Her seeing the psychiatrist sounds appropriate. We have been discussing this the past few visits and she is resistant to go back on risperdal. Leona SingletonMaria T Cypress Fanfan, MD

## 2014-09-04 ENCOUNTER — Other Ambulatory Visit: Payer: Self-pay | Admitting: Family Medicine

## 2014-10-07 ENCOUNTER — Ambulatory Visit: Payer: Self-pay

## 2014-10-18 ENCOUNTER — Ambulatory Visit: Payer: Self-pay

## 2014-10-26 ENCOUNTER — Encounter: Payer: Self-pay | Admitting: Family Medicine

## 2014-10-26 ENCOUNTER — Ambulatory Visit (INDEPENDENT_AMBULATORY_CARE_PROVIDER_SITE_OTHER): Payer: Self-pay | Admitting: Family Medicine

## 2014-10-26 VITALS — BP 136/78 | HR 98 | Temp 98.4°F | Ht 65.0 in | Wt 111.0 lb

## 2014-10-26 DIAGNOSIS — F258 Other schizoaffective disorders: Secondary | ICD-10-CM

## 2014-10-26 DIAGNOSIS — E785 Hyperlipidemia, unspecified: Secondary | ICD-10-CM

## 2014-10-26 DIAGNOSIS — I10 Essential (primary) hypertension: Secondary | ICD-10-CM

## 2014-10-26 LAB — LIPID PANEL
CHOL/HDL RATIO: 3.4 ratio (ref <=5.0)
Cholesterol: 230 mg/dL — ABNORMAL HIGH (ref 125–200)
HDL: 67 mg/dL (ref >=46)
LDL CALC: 146 mg/dL — AB (ref <130)
Triglycerides: 86 mg/dL (ref <150)
VLDL: 17 mg/dL (ref <30)

## 2014-10-26 LAB — COMPREHENSIVE METABOLIC PANEL
ALT: 15 U/L (ref 6–29)
AST: 40 U/L — AB (ref 10–35)
Albumin: 4.5 g/dL (ref 3.6–5.1)
Alkaline Phosphatase: 21 U/L — ABNORMAL LOW (ref 33–115)
BILIRUBIN TOTAL: 1 mg/dL (ref 0.2–1.2)
BUN: 9 mg/dL (ref 7–25)
CHLORIDE: 101 mmol/L (ref 98–110)
CO2: 26 mmol/L (ref 20–31)
Calcium: 9.7 mg/dL (ref 8.6–10.2)
Creat: 0.86 mg/dL (ref 0.50–1.10)
GLUCOSE: 78 mg/dL (ref 65–99)
Potassium: 5.1 mmol/L (ref 3.5–5.3)
SODIUM: 134 mmol/L — AB (ref 135–146)
Total Protein: 7.7 g/dL (ref 6.1–8.1)

## 2014-10-26 LAB — TSH: TSH: 1.312 u[IU]/mL (ref 0.350–4.500)

## 2014-10-26 NOTE — Progress Notes (Signed)
  Patient name: Sandra Bell MRN [id number]  Date of birth: 27-Nov-1970  CC & HPI:  Sandra Bell is a 44 y.o. female presenting today for HTN, HLD, and schizoaffective disorder.   Schizoaffective disorder - She reports her symptoms remained well-controlled off Risperdal for the past year.  She continues to hear voices on a almost daily basis, but reports this does not cause her any distress or interfere with her daily functioning.  However, she admits to living with her parents, and has no intention of living on her own again.  She reports she would stay with her siblings and her parents were unable to care for her any longer.  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  Feels like her cholesterol is better now that she is off Risperdal  Women's health - LMP 10/25/14; regular cycle for the past year since off Risperdal - Not currently sexually active or taking birth control - Previous mammogram negative.  However, she does not plan to get another mammogram until age 58 - No family history of breast cancer  ROS: See HPI   Medical & Surgical Hx:  Reviewed  Medications & Allergies: Reviewed  Social History:  Reviewed:   Objective Findings:  Vitals: BP 136/78 mmHg  Pulse 98  Temp(Src) 98.4 F (36.9 C) (Oral)  Ht  (1.651 m)  Wt 111 lb (50.349 kg)  BMI 18.47 kg/m2  LMP 10/25/2014  Gen: NAD CV: RRR w/o m/r/g, pulses +2 b/l Resp: CTAB w/ normal respiratory effort Psych: Normal thought content with decreased rate and intermittent pauses; denies active hallucinations or delusions; flat affect; mood Normal  Assessment & Plan:   Please See Problem Focused Assessment & Plan

## 2014-10-26 NOTE — Assessment & Plan Note (Signed)
Continues to hear voices. Off Risperdal now for one year.  Lives with parents and does not work.  Denied by Medicaid 3.  - Discussed referring to her [employer] for second opinion of schizoaffective disorder and is current on her functioning, as she is unable to complete IADLS independently  - She declines at this time but will consider - Follow-up every 6 months or sooner for new or concerning symptoms

## 2014-10-26 NOTE — Assessment & Plan Note (Signed)
BP well-controlled. Borderline low - No current symptoms of orthostatic hypotension; advised patient she could consider discontinuing hydrochlorothiazide now that she is off Risperdal.  She will consider this and call if she decides to discontinue this medication - Check CBC, CMP

## 2014-10-26 NOTE — Patient Instructions (Signed)
It was great seeing you today.   1. Your blood pressure is much better now that you are off Risperdal.  Let me know if you would like to stop this medication and see if your blood pressure stays controlled without it.  2. If you decided to stop your hydrochlorothiazide call and let me know.  Let me know if he would like to see a psychologist for a second opinion about your schizoaffective disorder.  This may help with receiving additional services or being approved for Medicaid.  I have order some labs today to check thyroid, cholesterol and basic blood cells. I will send you a letter with the results, or call you if we need to make any changes to your current therapies.    Please bring all your medications to every doctors visit  Sign up for My Chart to have easy access to your labs results, and communication with your Primary care physician.  Next Appointment  Please call to make an appointment with Dr Gayla Doss in 6 months   I look forward to talking with you again at our next visit. If you have any questions or concerns before then, please call the clinic at [phone].  Take Care,   Dr Wenda Low

## 2014-10-26 NOTE — Assessment & Plan Note (Signed)
Recheck lipid panel.  Off risperdal for one year

## 2014-10-26 NOTE — Progress Notes (Signed)
Dr. Gayla Doss requested a Behavioral Health Consult  Presenting Issue: Follow-up to check on patient's current functioning. Patient began weaning off of risperdal in September, 2015.    Report of symptoms:   1. Patient reported that she still experiences auditory hallucinations (i.e., voices) but denied that they are currently problematic or concerning. When asked to provide further detail about the voices, patient explained that she typically "hears" conversations between two individuals, and that the content of these conversations typically involves "discouraging" comments about the patient (e.g., "you're not doing so well today").  2. Patient decided to cease taking risperdal due to undesirable side effects including drowsiness, eye twitching, and weight gain.  3. Patient denied current mood disturbance, delusions, disorganized behavior, or negative schizophrenia-spectrum symptoms.   Impact on function: Patient denied negative impact on current functioning. She is not currently working but reported that this due to a desire to stay at home and help her parents (whom she currently lives with and is financially supported by), rather than an inability to do so. 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   Assessment / Plan / Recommendations:    - Patient does not currently meet criteria for schizoaffective disorder and does not seem to be significantly impaired at present. Following conversation with patient, [facility] consulted with Dr. Gayla Doss, who wanted a better diagnostic impression of patient's symptomatology. [facility] recommended that patient begin seeing a therapist, as well as reconsider psychopharmacotherapy if symptoms worsen or become impairing.

## 2014-10-27 LAB — CBC WITH DIFFERENTIAL/PLATELET
BASOS ABS: 0 10*3/uL (ref 0.0–0.1)
Basophils Relative: 1 % (ref 0–1)
EOS ABS: 0 10*3/uL (ref 0.0–0.7)
Eosinophils Relative: 1 % (ref 0–5)
HEMATOCRIT: 36.9 % (ref 36.0–46.0)
HEMOGLOBIN: 12.7 g/dL (ref 12.0–15.0)
LYMPHS PCT: 57 % — AB (ref 12–46)
Lymphs Abs: 1.9 10*3/uL (ref 0.7–4.0)
MCH: 32.8 pg (ref 26.0–34.0)
MCHC: 34.4 g/dL (ref 30.0–36.0)
MCV: 95.3 fL (ref 78.0–100.0)
MONOS PCT: 13 % — AB (ref 3–12)
MPV: 9.1 fL (ref 8.6–12.4)
Monocytes Absolute: 0.4 10*3/uL (ref 0.1–1.0)
NEUTROS PCT: 28 % — AB (ref 43–77)
Neutro Abs: 0.9 10*3/uL — ABNORMAL LOW (ref 1.7–7.7)
PLATELETS: 425 10*3/uL — AB (ref 150–400)
RBC: 3.87 MIL/uL (ref 3.87–5.11)
RDW: 13.3 % (ref 11.5–15.5)
WBC: 3.3 10*3/uL — ABNORMAL LOW (ref 4.0–10.5)

## 2014-11-02 ENCOUNTER — Encounter: Payer: Self-pay | Admitting: Family Medicine

## 2014-11-02 ENCOUNTER — Telehealth: Payer: Self-pay | Admitting: Family Medicine

## 2014-11-02 DIAGNOSIS — D72819 Decreased white blood cell count, unspecified: Secondary | ICD-10-CM

## 2014-11-02 NOTE — Telephone Encounter (Signed)
Called patient discussed recent blood work.  Her LDL is slightly high, but coming down since she has been off anti-psychotics.  We'll monitor at next PCP visit.  Also advised that her white blood cells were low and we should recheck this.  Placed future lab visit for 2-3 weeks.  Will call patient once I have those results.

## 2014-11-02 NOTE — Telephone Encounter (Signed)
Ordered future CBC

## 2014-11-22 ENCOUNTER — Other Ambulatory Visit: Payer: Self-pay

## 2014-11-22 DIAGNOSIS — D72819 Decreased white blood cell count, unspecified: Secondary | ICD-10-CM

## 2014-11-22 LAB — CBC WITH DIFFERENTIAL/PLATELET
BASOS ABS: 0 10*3/uL (ref 0.0–0.1)
BASOS PCT: 0 % (ref 0–1)
EOS ABS: 0 10*3/uL (ref 0.0–0.7)
EOS PCT: 0 % (ref 0–5)
HEMATOCRIT: 39.3 % (ref 36.0–46.0)
Hemoglobin: 13.3 g/dL (ref 12.0–15.0)
LYMPHS PCT: 40 % (ref 12–46)
Lymphs Abs: 2.2 10*3/uL (ref 0.7–4.0)
MCH: 32.4 pg (ref 26.0–34.0)
MCHC: 33.8 g/dL (ref 30.0–36.0)
MCV: 95.9 fL (ref 78.0–100.0)
MONO ABS: 0.6 10*3/uL (ref 0.1–1.0)
MPV: 9.6 fL (ref 8.6–12.4)
Monocytes Relative: 11 % (ref 3–12)
Neutro Abs: 2.7 10*3/uL (ref 1.7–7.7)
Neutrophils Relative %: 49 % (ref 43–77)
PLATELETS: 443 10*3/uL — AB (ref 150–400)
RBC: 4.1 MIL/uL (ref 3.87–5.11)
RDW: 12.5 % (ref 11.5–15.5)
WBC: 5.5 10*3/uL (ref 4.0–10.5)

## 2014-11-22 NOTE — Progress Notes (Signed)
CBC WITH DIFF DONE TODAY Sandra Bell 

## 2015-03-18 ENCOUNTER — Other Ambulatory Visit: Payer: Self-pay | Admitting: Family Medicine

## 2015-03-22 ENCOUNTER — Telehealth: Payer: Self-pay | Admitting: Family Medicine

## 2015-03-22 ENCOUNTER — Other Ambulatory Visit: Payer: Self-pay | Admitting: Family Medicine

## 2015-03-22 DIAGNOSIS — I10 Essential (primary) hypertension: Secondary | ICD-10-CM

## 2015-03-22 NOTE — Telephone Encounter (Signed)
Pt called and would like a refill on her Hydrochlorothiazide. jw

## 2015-03-23 MED ORDER — HYDROCHLOROTHIAZIDE 12.5 MG PO CAPS: 12.5000 mg | ORAL_CAPSULE | Freq: Every day | ORAL | Status: DC

## 2015-03-23 NOTE — Telephone Encounter (Signed)
Scheduled patient for 04/14/15. Sandra Bell,CMA

## 2015-04-14 ENCOUNTER — Encounter: Payer: Self-pay | Admitting: Family Medicine

## 2015-04-14 ENCOUNTER — Ambulatory Visit (INDEPENDENT_AMBULATORY_CARE_PROVIDER_SITE_OTHER): Payer: Self-pay | Admitting: Family Medicine

## 2015-04-14 VITALS — BP 131/83 | HR 97 | Temp 98.0°F | Wt 113.3 lb

## 2015-04-14 DIAGNOSIS — Z0000 Encounter for general adult medical examination without abnormal findings: Secondary | ICD-10-CM

## 2015-04-14 DIAGNOSIS — E785 Hyperlipidemia, unspecified: Secondary | ICD-10-CM

## 2015-04-14 DIAGNOSIS — I10 Essential (primary) hypertension: Secondary | ICD-10-CM

## 2015-04-14 NOTE — Assessment & Plan Note (Signed)
ASCVD < 5%. Cholesterol continues to improve off antipsychotics  - No statin for now

## 2015-04-14 NOTE — Assessment & Plan Note (Signed)
BP well controlled. ASCVD risk < 5% - Continue HCTZ 12.5 mg qd  - f/u in 6 months

## 2015-04-14 NOTE — Progress Notes (Signed)
  Patient name: Sandra Bell MRN [id number]  Date of birth: October 09, 1970  CC & HPI:  Lourine Alberico is a 45 y.o. female presenting today for HTN.   CHRONIC HYPERTENSION  BP Readings from Last 3 Encounters:  04/14/15 131/83  10/26/14 136/78  07/05/14 132/89    Control: good Disease Monitoring  Blood pressure range outside clinc: not checking   Chest pain: no   Dyspnea: no   Claudication: no  Medication compliance: yes  Medication Side Effects: NO Dizziness/lightheadedness;   Thiazide: (Joint pain, sexual dysfunction, hyperglycemia)    Preventitive Healthcare:   History  Smoking status  . Never Smoker   Smokeless tobacco  . Never Used    Exercise: 3 x week   ROS: See HPI   Medical & Surgical Hx:  Reviewed  Medications & Allergies: Reviewed  Social History: Reviewed:   Objective Findings:  Vitals: BP 131/83 mmHg  Pulse 97  Temp(Src) 98 F (36.7 C) (Oral)  Wt 113 lb 4.8 oz (51.393 kg)  LMP 04/12/2015 (Exact Date)  Gen: NAD CV: RRR w/o m/r/g, pulses +2 b/l Resp: CTAB w/ normal respiratory effort  Assessment & Plan:   HTN (hypertension) BP well controlled. ASCVD risk < 5% - Continue HCTZ 12.5 mg qd  - f/u in 6 months  HLD (hyperlipidemia) ASCVD < 5%. Cholesterol continues to improve off antipsychotics  - No statin for now   Health care maintenance She declines flu today.  - She will wait to age 39 before resuming Mammograms. No FHx of breast CA

## 2015-04-14 NOTE — Assessment & Plan Note (Signed)
She declines flu today.  - She will wait to age 45 before resuming Mammograms. No FHx of breast CA

## 2015-05-02 ENCOUNTER — Ambulatory Visit: Payer: Self-pay

## 2015-09-20 ENCOUNTER — Other Ambulatory Visit: Payer: Self-pay | Admitting: *Deleted

## 2015-09-20 DIAGNOSIS — I10 Essential (primary) hypertension: Secondary | ICD-10-CM

## 2015-09-20 MED ORDER — HYDROCHLOROTHIAZIDE 12.5 MG PO CAPS: 12.5000 mg | ORAL_CAPSULE | Freq: Every day | ORAL | Status: DC

## 2015-10-17 ENCOUNTER — Encounter: Payer: Self-pay | Admitting: Family Medicine

## 2015-10-17 ENCOUNTER — Ambulatory Visit (INDEPENDENT_AMBULATORY_CARE_PROVIDER_SITE_OTHER): Payer: Self-pay | Admitting: Family Medicine

## 2015-10-17 VITALS — BP 129/85 | HR 79 | Temp 98.3°F | Wt 119.0 lb

## 2015-10-17 DIAGNOSIS — E785 Hyperlipidemia, unspecified: Secondary | ICD-10-CM

## 2015-10-17 DIAGNOSIS — I10 Essential (primary) hypertension: Secondary | ICD-10-CM

## 2015-10-17 LAB — LIPID PANEL
CHOL/HDL RATIO: 2.7 ratio (ref <=5.0)
CHOLESTEROL: 194 mg/dL (ref 125–200)
HDL: 73 mg/dL (ref >=46)
LDL Cholesterol: 106 mg/dL (ref <130)
TRIGLYCERIDES: 73 mg/dL (ref <150)
VLDL: 15 mg/dL (ref <30)

## 2015-10-17 LAB — BASIC METABOLIC PANEL WITH GFR
BUN: 8 mg/dL (ref 7–25)
CALCIUM: 9.8 mg/dL (ref 8.6–10.2)
CO2: 28 mmol/L (ref 20–31)
Chloride: 100 mmol/L (ref 98–110)
Creat: 1.05 mg/dL (ref 0.50–1.10)
GFR, EST AFRICAN AMERICAN: 73 mL/min (ref >=60)
GFR, EST NON AFRICAN AMERICAN: 63 mL/min (ref >=60)
GLUCOSE: 75 mg/dL (ref 65–99)
POTASSIUM: 3.8 mmol/L (ref 3.5–5.3)
SODIUM: 137 mmol/L (ref 135–146)

## 2015-10-17 NOTE — Progress Notes (Signed)
.  AD  Subjective:    Patient ID: Sandra Bell, female    DOB: 1970/07/19, 45 y.o.   MRN: [id number]   CC: Follow up for Hypertension and hyperlipidemia  HPI: Patient is a 45 yo female with a past medical history significant for hypertension, hyperlipidemia and schizoaffective affective disorder who presented here for follow or hypertension and hyperlipidemia. Patient reports that se has been doing well and has been following the dash diet. Patient stop her risperidone two years and reports that her mood has been appropriate with no complaints. Patient has been exercising 3 times a week for abut 20 min. Patient currently lives with parents. Patient denies any chest pain, shortness of breath, abdominal pain, headaches, vision troubles, nausea and vomiting.  Smoking status reviewed   ROS: all other systems were reviewed and are negative other than in the HPI   Past medical history, surgical, family, and social history reviewed and updated in the EMR as appropriate.  Objective:  BP 129/85   Pulse 79   Temp 98.3 F (36.8 C) (Oral)   Wt 119 lb (54 kg)   LMP 09/28/2015   SpO2 100%   BMI 19.80 kg/m  Vitals and nursing note reviewed General: NAD, pleasant, able to participate in exam Cardiac: RRR, normal heart sounds, no murmurs. 2+ radial and PT pulses bilaterally Respiratory: CTAB, normal effort, No wheezes, rales or rhonchi Abdomen: soft, nontender, nondistended, no hepatic or splenomegaly, +BS Extremities: no edema or cyanosis. WWP. Skin: warm and dry, no rashes noted Neuro: alert and oriented x4, no focal deficits Psych: Normal affect and mood   Assessment & Plan:   Patient is a 45 yo female with a past medical history significant for hypertension, hyperlipidemia and schizoaffective affective disorder who presented here for follow or hypertension and hyperlipidemia.  HTN (hypertension) Hypertension is well controlled on current medications. BP 129/85 --Continue HCTZ 12.5mg   once daily --Will order BMP --Continue DASH Diet, and exercise regimen --Will follow up in 6 months  HLD (hyperlipidemia) Patient ASCVD was 5% (04/2015). Patient was not started statin. Last cholesterol 230, HLD 67, LDL 146. At this time no need to start on cholesterol medication. --Will order Lipid panel, and recalculate ASCVD --Follow up in 6 months   Lovena NeighboursAbdoulaye Akhil Piscopo, MD Family Medicine Resident PGY-1

## 2015-10-17 NOTE — Assessment & Plan Note (Addendum)
Patient ASCVD was 5% (04/2015). Patient was not started statin. Last cholesterol 230, HLD 67, LDL 146. At this time no need to start on cholesterol medication. --Will order Lipid panel, and recalculate ASCVD --Follow up in 6 months

## 2015-10-17 NOTE — Assessment & Plan Note (Signed)
Hypertension is well controlled on current medications. BP 129/85 --Continue HCTZ 12.5mg  once daily --Will order BMP --Continue DASH Diet, and exercise regimen --Will follow up in 6 months

## 2015-10-17 NOTE — Patient Instructions (Signed)
It was great seeing you today! We have addressed the following issues today  1. Hypertension-Your blood pressure is well controlled, continue current regimen HCTZ 12.5mg . Continue with Dash diet as we discussed. 2. Hyperlipidemia-We will check your cholesterol, At the moment there is no need to start on any cholesterol medication.     If we did any lab work today, and the results require attention, either me or my nurse will get in touch with you. If everything is normal, you will get a letter in mail. If you don't hear from us in two weeks, please give us a call. Otherwise, I look forward to talking with you again at our next visit. If you have any questions or concerns before then, please call the clinic at [phone].  Please bring all your medications to every doctors visit   Sign up for My Chart to have easy access to your labs results, and communication with your Primary care physician.    Please check-out at the front desk before leaving the clinic.   Take Care,

## 2015-11-03 ENCOUNTER — Ambulatory Visit: Payer: Self-pay

## 2016-02-24 ENCOUNTER — Other Ambulatory Visit: Payer: Self-pay | Admitting: Family Medicine

## 2016-02-24 DIAGNOSIS — I10 Essential (primary) hypertension: Secondary | ICD-10-CM

## 2016-04-23 ENCOUNTER — Ambulatory Visit (INDEPENDENT_AMBULATORY_CARE_PROVIDER_SITE_OTHER): Payer: Self-pay | Admitting: Family Medicine

## 2016-04-23 ENCOUNTER — Encounter: Payer: Self-pay | Admitting: Family Medicine

## 2016-04-23 DIAGNOSIS — I10 Essential (primary) hypertension: Secondary | ICD-10-CM

## 2016-04-23 MED ORDER — HYDROCHLOROTHIAZIDE 12.5 MG PO CAPS: 25.0000 mg | ORAL_CAPSULE | Freq: Every day | ORAL | 3 refills | Status: DC

## 2016-04-23 NOTE — Progress Notes (Signed)
   Subjective:    Patient ID: Sandra LawsStephanie Bell, female    DOB: 1971/01/17, 46 y.o.   MRN: [id number]   CC: BP follow up   HPI: Patient is a 46 yo female with a past medical history significant for hypertension, hyperlipidemia and schizoaffective affective disorder who presented here today for follow up on her hypertension.Patient reports that she has been doing well, and is still taking her medications and has been following the DASH diet. Patient reports that she was started on Invega (paliperidone) a few months ago by her ACT team to help regulate her sleep cycle. Otherwise patient has been in her normal state of health with no complaints.  Smoking status reviewed   ROS: all other systems were reviewed and are negative other than in the HPI   Past medical history, surgical, family, and social history reviewed and updated in the EMR as appropriate.  Objective:  BP 136/80   Pulse 74   Temp 98.3 F (36.8 C) (Oral)   Ht 5\' 5"  (1.651 m)   Wt 58.9 kg (129 lb 12.8 oz)   LMP 04/21/2016 (Exact Date)   SpO2 99%   BMI 21.60 kg/m   Vitals and nursing note reviewed  General: NAD, pleasant, able to participate in exam Cardiac: RRR, normal heart sounds, no murmurs. 2+ radial and PT pulses bilaterally Respiratory: CTAB, normal effort, No wheezes, rales or rhonchi Abdomen: soft, nontender, nondistended, no hepatic or splenomegaly, +BS Extremities: no edema or cyanosis. WWP. Skin: warm and dry, no rashes noted Neuro: alert and oriented x4, no focal deficits Psych: Normal affect and mood   Assessment & Plan:   #BP follow up Patient with elevated BP on initial measurement at 154/100. Repeat was 136/80. Patient BP slightly more elevated than 6 months ago, with the onl change being the addition of monthly invega injection to her regimen. invega was found to cause hypertension/ increase BP in 2% of patient population taking it. Given increase BP though mild in the setting of new medications will  adjust current regimen. Patient will  transition from a monthly Invega to every three months, hoping this change will help in keeping BP within normal range with minimal dosage change to current medications. --Will increase HCTZ from 12.5 mg daily to 25 mg daily --Will see patient in a month for BP recheck.    Lovena NeighboursAbdoulaye Kypton Eltringham, MD Family Medicine Resident PGY-1

## 2016-04-23 NOTE — Patient Instructions (Signed)
It was great seeing you today! We have addressed the following issues today  1. I will increase your BP med to 25 mg daily. I will see you in clinic in about a month and we will see if we need to readjust your dosage or not. 2. In the meantime, continue your dash diet.  If we did any lab work today, and the results require attention, either me or my nurse will get in touch with you. If everything is normal, you will get a letter in mail and a message via . If you don't hear from us in two weeks, please give us a call. Otherwise, we look forward to seeing you again at your next visit. If you have any questions or concerns before then, please call the clinic at [phone].  Please bring all your medications to every doctors visit  Sign up for My Chart to have easy access to your labs results, and communication with your Primary care physician.    Please check-out at the front desk before leaving the clinic.    Take Care,   Dr. Sydnee Cabaliallo

## 2016-05-03 ENCOUNTER — Telehealth: Payer: Self-pay | Admitting: Family Medicine

## 2016-05-03 NOTE — Telephone Encounter (Signed)
Is taking HCTZ 12.5 mg and she feels it is too strong for her.  Is there something that is not as strong?  Please advise

## 2016-05-03 NOTE — Telephone Encounter (Signed)
Pt informed of PCP recommendation to go back down to 12.5mg . Pt voiced understanding.

## 2016-06-28 ENCOUNTER — Ambulatory Visit: Payer: Self-pay

## 2016-07-13 ENCOUNTER — Ambulatory Visit (INDEPENDENT_AMBULATORY_CARE_PROVIDER_SITE_OTHER): Payer: Self-pay | Admitting: Family Medicine

## 2016-07-13 ENCOUNTER — Encounter: Payer: Self-pay | Admitting: Family Medicine

## 2016-07-13 VITALS — BP 118/80 | HR 90 | Temp 98.2°F | Ht 65.0 in | Wt 133.4 lb

## 2016-07-13 DIAGNOSIS — I10 Essential (primary) hypertension: Secondary | ICD-10-CM

## 2016-07-13 NOTE — Patient Instructions (Signed)
It was great seeing you today! We have addressed the following issues today  1. Your Blood pressure is well controlled, continue on your current regimen. Dash diet is also excellent for your blood pressure and will recommend continuing with it. Continue with exercise regimen as well.  2. I will see you in about six months.  If we did any lab work today, and the results require attention, either me or my nurse will get in touch with you. If everything is normal, you will get a letter in mail and a message via . If you don't hear from us in two weeks, please give us a call. Otherwise, we look forward to seeing you again at your next visit. If you have any questions or concerns before then, please call the clinic at [phone].  Please bring all your medications to every doctors visit  Sign up for My Chart to have easy access to your labs results, and communication with your Primary care physician.    Please check-out at the front desk before leaving the clinic.    Take Care,   Dr. Sydnee Cabaliallo

## 2016-07-13 NOTE — Progress Notes (Signed)
   Subjective:    Patient ID: Sandra Bell, female    DOB: 1970/12/10, 46 y.o.   MRN: [id number]   CC: Follow up hypertension  HPI: Patient present today to follow up on her Blood pressure. Patient has ben taking his HCTZ 12.5 daily as prescribed, she has been recording her BP at home and has been averaging around 120 for systolic and 80 for diastolic. Patient is continuing with the DASH diet and seem to be doing fine on that. Patient is also exercising regularly. She is still follow by her ACT team and continue to receive Invega shot on a monthly basis and has 3 more dose before changing her schedule to quarterly invega shot.  Smoking status reviewed   ROS: all other systems were reviewed and are negative other than in the HPI   Past Medical History:  Diagnosis Date  . Schizoaffective disorder (HCC) 2003   Past Surgical History:  Procedure Laterality Date  . FINGER SURGERY  1983    Objective:  BP 118/80 (BP Location: Right Arm, Patient Position: Sitting, Cuff Size: Normal)   Pulse 90   Temp 98.2 F (36.8 C) (Oral)   Ht 5\' 5"  (1.651 m)   Wt 133 lb 6.4 oz (60.5 kg)   LMP  (LMP Unknown)   SpO2 99%   BMI 22.20 kg/m   Vitals and nursing note reviewed  General: NAD, pleasant, able to participate in exam Cardiac: RRR, normal heart sounds, no murmurs. 2+ radial and PT pulses bilaterally Respiratory: CTAB, normal effort, No wheezes, rales or rhonchi Abdomen: soft, nontender, nondistended, no hepatic or splenomegaly, +BS Extremities: no edema or cyanosis. WWP. Skin: warm and dry, no rashes noted Neuro: alert and oriented x4, no focal deficits Psych: Normal affect and mood   Assessment & Plan:   #Follow up on BP  Patient initial BP was 130/90, it was remeasured and was found to be 118/80 which is essential ideal BP for her. Patient does not multiple comorbidities and is at target for BP. Will not make any changes to current treatment plan. --Contine HCTZ 12.5 daily    --Continue Dash diet  --Continue to exercise regimen --I will see you in 6 months or sooner if needed   Lovena NeighboursAbdoulaye Desean Heemstra, MD Family Medicine Resident PGY-1

## 2016-09-14 ENCOUNTER — Other Ambulatory Visit: Payer: Self-pay | Admitting: Family Medicine

## 2016-09-14 DIAGNOSIS — I10 Essential (primary) hypertension: Secondary | ICD-10-CM

## 2017-01-21 ENCOUNTER — Other Ambulatory Visit: Payer: Self-pay | Admitting: Family Medicine

## 2017-01-21 DIAGNOSIS — I10 Essential (primary) hypertension: Secondary | ICD-10-CM

## 2017-02-06 ENCOUNTER — Ambulatory Visit: Payer: Self-pay

## 2017-03-15 ENCOUNTER — Other Ambulatory Visit: Payer: Self-pay

## 2017-03-15 ENCOUNTER — Ambulatory Visit (INDEPENDENT_AMBULATORY_CARE_PROVIDER_SITE_OTHER): Payer: Self-pay | Admitting: Family Medicine

## 2017-03-15 ENCOUNTER — Encounter: Payer: Self-pay | Admitting: Family Medicine

## 2017-03-15 VITALS — BP 118/78 | HR 91 | Temp 98.4°F | Ht 65.0 in | Wt 147.0 lb

## 2017-03-15 DIAGNOSIS — Z0000 Encounter for general adult medical examination without abnormal findings: Secondary | ICD-10-CM

## 2017-03-15 DIAGNOSIS — I10 Essential (primary) hypertension: Secondary | ICD-10-CM

## 2017-03-15 NOTE — Patient Instructions (Addendum)
It was great seeing you today! We have addressed the following issues today  1. Your blood pressure is good, continue with your HCTZ 12.5 mg.  2. I will check your cholesterol and will let you know about the results. 3. We will do a pap smear at next visit.  If we did any lab work today, and the results require attention, either me or my nurse will get in touch with you. If everything is normal, you will get a letter in mail and a message via . If you don't hear from us in two weeks, please give us a call. Otherwise, we look forward to seeing you again at your next visit. If you have any questions or concerns before then, please call the clinic at [phone].  Please bring all your medications to every doctors visit  Sign up for My Chart to have easy access to your labs results, and communication with your Primary care physician. Please ask Front Desk for some assistance.   Please check-out at the front desk before leaving the clinic.    Take Care,   Dr. Sydnee Cabaliallo \

## 2017-03-15 NOTE — Progress Notes (Signed)
   Subjective:    Patient ID: Sandra LawsStephanie Bell, female    DOB: Nov 26, 1970, 47 y.o.   MRN: [id number]   CC: Follow up for blood pressure  HPI: Patient is a 47 yo female with a past medical history significant for HTN, HLD and schizophrenia who presents today to follow up on HTN. Patient continue to take her HCTZ 12.5 mg daily and denies any episodes of hypotension or dizziness. Patient reports doing well overall without any acute complaints. She denies chest pain, SOB, abdominal pain, headaches, tinnitus or vision changes.  Smoking status reviewed   ROS: all other systems were reviewed and are negative other than in the HPI   Past Medical History:  Diagnosis Date  . Schizoaffective disorder (HCC) 2003    Past Surgical History:  Procedure Laterality Date  . FINGER SURGERY  1983    Past medical history, surgical, family, and social history reviewed and updated in the EMR as appropriate.  Objective:  BP 118/78   Pulse 91   Temp 98.4 F (36.9 C) (Oral)   Ht 5\' 5"  (1.651 m)   Wt 147 lb (66.7 kg)   SpO2 99%   BMI 24.46 kg/m   Vitals and nursing note reviewed  General: NAD, pleasant, able to participate in exam Cardiac: RRR, normal heart sounds, no murmurs. 2+ radial and PT pulses bilaterally Respiratory: CTAB, normal effort, No wheezes, rales or rhonchi Abdomen: soft, nontender, nondistended, no hepatic or splenomegaly, +BS Extremities: no edema or cyanosis. WWP. Skin: warm and dry, no rashes noted Neuro: alert and oriented x4, no focal deficits Psych: Normal affect and mood   Assessment & Plan:   #Follow up HTN, chronic, well controlled BP 118/78. Well controlled will continue current regimen. --Continue HCTZ 12.5 mg daily.  Healthcare maintenance HIV testing   Lovena NeighboursAbdoulaye Tylyn Derwin, MD [facility] PGY-2

## 2017-03-16 LAB — LIPID PANEL
CHOLESTEROL TOTAL: 251 mg/dL — AB (ref 100–199)
Chol/HDL Ratio: 4.6 ratio — ABNORMAL HIGH (ref 0.0–4.4)
HDL: 55 mg/dL (ref >39)
LDL CALC: 172 mg/dL — AB (ref 0–99)
Triglycerides: 122 mg/dL (ref 0–149)
VLDL Cholesterol Cal: 24 mg/dL (ref 5–40)

## 2017-03-16 LAB — HIV ANTIBODY (ROUTINE TESTING W REFLEX): HIV Screen 4th Generation wRfx: NONREACTIVE

## 2017-05-24 ENCOUNTER — Other Ambulatory Visit: Payer: Self-pay

## 2017-05-24 DIAGNOSIS — I10 Essential (primary) hypertension: Secondary | ICD-10-CM

## 2017-05-27 ENCOUNTER — Other Ambulatory Visit: Payer: Self-pay

## 2017-05-27 ENCOUNTER — Ambulatory Visit (INDEPENDENT_AMBULATORY_CARE_PROVIDER_SITE_OTHER): Payer: Self-pay | Admitting: Family Medicine

## 2017-05-27 ENCOUNTER — Encounter: Payer: Self-pay | Admitting: Family Medicine

## 2017-05-27 DIAGNOSIS — I10 Essential (primary) hypertension: Secondary | ICD-10-CM

## 2017-05-27 MED ORDER — HYDROCHLOROTHIAZIDE 12.5 MG PO CAPS: 12.5000 mg | ORAL_CAPSULE | Freq: Every day | ORAL | 3 refills | Status: DC

## 2017-05-27 NOTE — Progress Notes (Signed)
   Subjective:    Patient ID: Sandra Bell, female    DOB: [birth year], 47 y.o.   MRN: [id number]   CC: Medication assistance form to be filled  HPI: Patient is a 47 yo female with a past medical history significant for  HTN, HLD, and schizoaffective disorder who is here today to have a [employer] medication assistance program form filledby PCP. [facility] three months. She has no acute complaints today.  Smoking status reviewed   ROS: all other systems were reviewed and are negative other than in the HPI   Past Medical History:  Diagnosis Date  . Schizoaffective disorder (HCC) 2003    Past Surgical History:  Procedure Laterality Date  . FINGER SURGERY  1983    Past medical history, surgical, family, and social history reviewed and updated in the EMR as appropriate.  Objective:  BP 122/82   Pulse 78   Temp 98.3 F (36.8 C) (Oral)   Wt 147 lb (66.7 kg)   SpO2 99%   BMI 24.46 kg/m   Vitals and nursing note reviewed  General: NAD, pleasant, able to participate in exam Cardiac: RRR, normal heart sounds, no murmurs. 2+ radial and PT pulses bilaterally Respiratory: CTAB, normal effort, No wheezes, rales or rhonchi Abdomen: soft, nontender, nondistended, no hepatic or splenomegaly, +BS Extremities: no edema or cyanosis. WWP. Skin: warm and dry, no rashes noted Neuro: alert and oriented x4, no focal deficits Psych: Normal affect and mood   Assessment & Plan:   #Form filling for meds assistance Form for medication assistance program filled today for patient she will be able to have a one year free Invega injectable for her mood disorder.    Lovena NeighboursAbdoulaye Blessen Kimbrough, MD [facility] PGY-2

## 2017-08-07 ENCOUNTER — Ambulatory Visit: Payer: Self-pay

## 2017-09-19 ENCOUNTER — Ambulatory Visit (INDEPENDENT_AMBULATORY_CARE_PROVIDER_SITE_OTHER): Payer: Self-pay | Admitting: Family Medicine

## 2017-09-19 ENCOUNTER — Other Ambulatory Visit: Payer: Self-pay

## 2017-09-19 ENCOUNTER — Encounter: Payer: Self-pay | Admitting: Family Medicine

## 2017-09-19 DIAGNOSIS — I10 Essential (primary) hypertension: Secondary | ICD-10-CM

## 2017-09-19 MED ORDER — HYDROCHLOROTHIAZIDE 12.5 MG PO CAPS: 12.5000 mg | ORAL_CAPSULE | Freq: Every day | ORAL | 3 refills | Status: DC

## 2017-09-19 NOTE — Patient Instructions (Signed)
It was great seeing you today! We have addressed the following issues today  1. Your blood pressure is good , continue with the medications. 2. Will check your cholesterol at the next visit. 3. We will look into assistance to help you pay for the pap smear. Your application is still being process.  If we did any lab work today, and the results require attention, either me or my nurse will get in touch with you. If everything is normal, you will get a letter in mail and a message via . If you don't hear from us in two weeks, please give us a call. Otherwise, we look forward to seeing you again at your next visit. If you have any questions or concerns before then, please call the clinic at [phone].  Please bring all your medications to every doctors visit  Sign up for My Chart to have easy access to your labs results, and communication with your Primary care physician. Please ask Front Desk for some assistance.   Please check-out at the front desk before leaving the clinic.    Take Care,   Dr. Sydnee Cabaliallo

## 2017-09-19 NOTE — Progress Notes (Signed)
   Subjective:    Patient ID: Sandra Bell, female    DOB: 1970-12-04, 47 y.o.   MRN: [id number]   CC: Blood pressure follow up   HPI: Patient is a 47 yo female with a past medical history significant for HTN and and schizoaffective disorder who presents today to follow up on HTN. Patient has been taking her medication as prescribed. No Headaches, dizziness, vision change, tinnitus.  Patient continued to receive her [place] invega-tinza shot for her schizophrenia every 3 months.  [facility].  Smoking status reviewed   ROS: all other systems were reviewed and are negative other than in the HPI   Past Medical History:  Diagnosis Date  . Schizoaffective disorder (HCC) 2003    Past Surgical History:  Procedure Laterality Date  . FINGER SURGERY  1983    Past medical history, surgical, family, and social history reviewed and updated in the EMR as appropriate.  Objective:  BP 118/60   Pulse 73   Temp 98.3 F (36.8 C) (Oral)   Ht 5\' 5"  (1.651 m)   Wt 143 lb (64.9 kg)   SpO2 91%   BMI 23.80 kg/m   Vitals and nursing note reviewed  General: NAD, pleasant, able to participate in exam Cardiac: RRR, normal heart sounds, no murmurs. 2+ radial and PT pulses bilaterally Respiratory: CTAB, normal effort, No wheezes, rales or rhonchi Abdomen: soft, nontender, nondistended, no hepatic or splenomegaly, +BS Extremities: no edema or cyanosis. WWP. Skin: warm and dry, no rashes noted Neuro: alert and oriented x4, no focal deficits Psych: Normal affect and mood   Assessment & Plan:   HTN (hypertension) BP today 118/60 at goal. Patient continue to be adherent to medication. Will continue current plan.  --Continue HCTZ 12.5 mg once daily  Awaiting financial assistance for Pap smear. Currently patient unable to afford since she has the [employer] which does not cover the procedure.   [name], MD [facility] PGY-3

## 2017-09-19 NOTE — Assessment & Plan Note (Signed)
BP today 118/60 at goal. Patient continue to be adherent to medication. Will continue current plan.  --Continue HCTZ 12.5 mg once daily

## 2017-09-29 ENCOUNTER — Other Ambulatory Visit: Payer: Self-pay | Admitting: Family Medicine

## 2017-09-29 DIAGNOSIS — I10 Essential (primary) hypertension: Secondary | ICD-10-CM

## 2018-04-01 ENCOUNTER — Encounter: Payer: Self-pay | Admitting: Family Medicine

## 2018-04-01 ENCOUNTER — Ambulatory Visit (INDEPENDENT_AMBULATORY_CARE_PROVIDER_SITE_OTHER): Payer: Self-pay | Admitting: Family Medicine

## 2018-04-01 ENCOUNTER — Other Ambulatory Visit: Payer: Self-pay

## 2018-04-01 VITALS — BP 124/70 | HR 78 | Temp 98.1°F | Ht 65.0 in | Wt 141.0 lb

## 2018-04-01 DIAGNOSIS — I10 Essential (primary) hypertension: Secondary | ICD-10-CM

## 2018-04-01 DIAGNOSIS — E782 Mixed hyperlipidemia: Secondary | ICD-10-CM

## 2018-04-01 LAB — POCT GLYCOSYLATED HEMOGLOBIN (HGB A1C): Hemoglobin A1C: 4.5 % (ref 4.0–5.6)

## 2018-04-01 NOTE — Patient Instructions (Signed)
It was great seeing you today! We have addressed the following issues today  1. We will do blood work today and I will follow up on the results. 2. Continue with your current Blood Pressure medication.  If we did any lab work today, and the results require attention, either me or my nurse will get in touch with you. If everything is normal, you will get a letter in mail and a message via . If you don't hear from [place] in two weeks, please give [place] a call. Otherwise, we look forward to seeing you again at your next visit. If you have any questions or concerns before then, please call the clinic at [phone].  Please bring all your medications to every doctors visit  Sign up for My Chart to have easy access to your labs results, and communication with your Primary care physician. Please ask Front Desk for some assistance.   Please check-out at the front desk before leaving the clinic.    Take Care,   Dr. Sydnee Cabal

## 2018-04-01 NOTE — Assessment & Plan Note (Signed)
Will recheck lipid panel today. Expect cholesterol and HDL to be elevated. Given co-morbidities and age, will start patient on low dose statin. Discuss with patient, she is in agreement.  --Order lipid panel, A1c. Will follow up on the results. --Will start on atorvastatin 20 mg

## 2018-04-01 NOTE — Assessment & Plan Note (Signed)
124/70. At goal, will continue current regimen. --Order CMP, CBC, TSH

## 2018-04-01 NOTE — Progress Notes (Signed)
   Subjective:    Patient ID: Sandra Bell, female    DOB: 1970-07-08, 48 y.o.   MRN: [id number]   CC: Follow up for blood pressure   HPI: Patient is a 48 yo female with a past medical history significant for HTN and schizoaffective disorder who presents today for BP check. Patient reports she has been doing well, she continue to take her medications as prescribed. 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 She continue to go to [facility] for therapy and is getting her Invega injection next week. She is otherwise feeling well denies any chest pain, abdominal pain, SOB, headaches dizziness, headaches or vision changes.  Smoking status reviewed   ROS: all other systems were reviewed and are negative other than in the HPI   Past Medical History:  Diagnosis Date  . Schizoaffective disorder (HCC) 2003    Past Surgical History:  Procedure Laterality Date  . FINGER SURGERY  1983    Past medical history, surgical, family, and social history reviewed and updated in the EMR as appropriate.  Objective:  BP 124/70 (BP Location: Left Arm, Patient Position: Sitting, Cuff Size: Normal)   Pulse 78   Temp 98.1 F (36.7 C) (Oral)   Ht 5\' 5"  (1.651 m)   Wt 141 lb (64 kg)   SpO2 98%   BMI 23.46 kg/m   Vitals and nursing note reviewed  General: NAD, pleasant, able to participate in exam Cardiac: RRR, normal heart sounds, no murmurs. 2+ radial and PT pulses bilaterally Respiratory: CTAB, normal effort, No wheezes, rales or rhonchi Abdomen: soft, nontender, nondistended, no hepatic or splenomegaly, +BS Extremities: no edema or cyanosis. WWP. Skin: warm and dry, no rashes noted Neuro: alert and oriented x4, no focal deficits Psych: Normal affect and mood   Assessment & Plan:   HTN (hypertension) 124/70. At goal, will continue current regimen. --Order CMP, CBC, TSH  HLD (hyperlipidemia) Will recheck lipid panel today. Expect  cholesterol and HDL to be elevated. Given co-morbidities and age, will start patient on low dose statin. Discuss with patient, she is in agreement.  --Order lipid panel, A1c. Will follow up on the results. --Will start on atorvastatin 20 mg    Lovena Neighbours, MD [facility] PGY-3

## 2018-04-02 LAB — CBC WITH DIFFERENTIAL/PLATELET
BASOS ABS: 0 10*3/uL (ref 0.0–0.2)
BASOS: 1 %
EOS (ABSOLUTE): 0.1 10*3/uL (ref 0.0–0.4)
Eos: 2 %
Hematocrit: 38.4 % (ref 34.0–46.6)
Hemoglobin: 13.1 g/dL (ref 11.1–15.9)
Immature Grans (Abs): 0 10*3/uL (ref 0.0–0.1)
Immature Granulocytes: 0 %
LYMPHS ABS: 2.1 10*3/uL (ref 0.7–3.1)
Lymphs: 54 %
MCH: 31.2 pg (ref 26.6–33.0)
MCHC: 34.1 g/dL (ref 31.5–35.7)
MCV: 91 fL (ref 79–97)
MONOS ABS: 0.4 10*3/uL (ref 0.1–0.9)
Monocytes: 10 %
NEUTROS ABS: 1.3 10*3/uL — AB (ref 1.4–7.0)
Neutrophils: 33 %
PLATELETS: 396 10*3/uL (ref 150–450)
RBC: 4.2 x10E6/uL (ref 3.77–5.28)
RDW: 12.2 % (ref 11.7–15.4)
WBC: 3.9 10*3/uL (ref 3.4–10.8)

## 2018-04-02 LAB — COMPREHENSIVE METABOLIC PANEL
ALBUMIN: 4.8 g/dL (ref 3.8–4.8)
ALK PHOS: 52 IU/L (ref 39–117)
ALT: 10 IU/L (ref 0–32)
AST: 16 IU/L (ref 0–40)
Albumin/Globulin Ratio: 1.7 (ref 1.2–2.2)
BUN/Creatinine Ratio: 6 — ABNORMAL LOW (ref 9–23)
BUN: 8 mg/dL (ref 6–24)
Bilirubin Total: 0.7 mg/dL (ref 0.0–1.2)
CHLORIDE: 99 mmol/L (ref 96–106)
CO2: 25 mmol/L (ref 20–29)
Calcium: 10.5 mg/dL — ABNORMAL HIGH (ref 8.7–10.2)
Creatinine, Ser: 1.25 mg/dL — ABNORMAL HIGH (ref 0.57–1.00)
GFR, EST AFRICAN AMERICAN: 58 mL/min/{1.73_m2} — AB (ref >59)
GFR, EST NON AFRICAN AMERICAN: 51 mL/min/{1.73_m2} — AB (ref >59)
GLUCOSE: 88 mg/dL (ref 65–99)
Globulin, Total: 2.8 g/dL (ref 1.5–4.5)
Potassium: 3.8 mmol/L (ref 3.5–5.2)
SODIUM: 140 mmol/L (ref 134–144)
Total Protein: 7.6 g/dL (ref 6.0–8.5)

## 2018-04-02 LAB — LIPID PANEL
Chol/HDL Ratio: 4.9 ratio — ABNORMAL HIGH (ref 0.0–4.4)
Cholesterol, Total: 268 mg/dL — ABNORMAL HIGH (ref 100–199)
HDL: 55 mg/dL (ref >39)
LDL Calculated: 194 mg/dL — ABNORMAL HIGH (ref 0–99)
Triglycerides: 95 mg/dL (ref 0–149)
VLDL CHOLESTEROL CAL: 19 mg/dL (ref 5–40)

## 2018-04-02 LAB — TSH: TSH: 1.75 u[IU]/mL (ref 0.450–4.500)

## 2018-04-07 ENCOUNTER — Other Ambulatory Visit: Payer: Self-pay | Admitting: Family Medicine

## 2018-04-07 DIAGNOSIS — E785 Hyperlipidemia, unspecified: Secondary | ICD-10-CM

## 2018-04-07 MED ORDER — ROSUVASTATIN CALCIUM 20 MG PO TABS: 20.0000 mg | ORAL_TABLET | Freq: Every day | ORAL | 3 refills | Status: DC

## 2018-04-09 ENCOUNTER — Other Ambulatory Visit: Payer: Self-pay | Admitting: Family Medicine

## 2018-04-09 DIAGNOSIS — I10 Essential (primary) hypertension: Secondary | ICD-10-CM

## 2018-05-29 ENCOUNTER — Telehealth: Payer: Self-pay | Admitting: Family Medicine

## 2018-05-29 NOTE — Telephone Encounter (Signed)
Pt is calling an would like to know if Dr. 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   Pt said the note needs to state she should be excused due to her schizophrenia.   Please call pt with any further questions. The number in her chart is the best call back number.

## 2018-05-30 ENCOUNTER — Encounter: Payer: Self-pay | Admitting: Family Medicine

## 2018-05-30 NOTE — Progress Notes (Signed)
Spoke with pt informed her that her letter is at the front for pick up. Pt understood. Sandra Bell, CMA

## 2018-07-04 ENCOUNTER — Ambulatory Visit (HOSPITAL_COMMUNITY)
Admission: EM | Admit: 2018-07-04 | Discharge: 2018-07-04 | Disposition: A | Discharge status: Home / Self Care | Payer: Self-pay | Attending: Emergency Medicine | Admitting: Emergency Medicine

## 2018-07-04 ENCOUNTER — Encounter (HOSPITAL_COMMUNITY): Payer: Self-pay | Admitting: Emergency Medicine

## 2018-07-04 ENCOUNTER — Other Ambulatory Visit: Payer: Self-pay

## 2018-07-04 DIAGNOSIS — M7918 Myalgia, other site: Secondary | ICD-10-CM

## 2018-07-04 MED ORDER — NAPROXEN 375 MG PO TABS: 375.0000 mg | ORAL_TABLET | Freq: Two times a day (BID) | ORAL | 0 refills | Status: DC

## 2018-07-04 NOTE — Discharge Instructions (Signed)
Light and regular activity as tolerated. See exercises provided.  Heat application may be helpful.  May take naproxen twice a day as needed for pain, take with food.  If develop increased pain, redness, swelling, numbness tingling cough, shortness of breath , or otherwise worsening please go to the ER.  If pain persists please follow up with your primary care provider.

## 2018-07-04 NOTE — ED Triage Notes (Signed)
Per pt she said she felt as if she slept on her right shoulder wrong bc when she woke up Wednesday it was hurting., no injury or fall

## 2018-07-04 NOTE — ED Notes (Signed)
Patient able to ambulate independently  

## 2018-07-04 NOTE — ED Provider Notes (Signed)
[facility]    CSN: [id number] Arrival date & time: 07/04/18  1513     History   Chief Complaint Chief Complaint  Patient presents with  . Shoulder Pain    HPI Sandra Bell is a 48 y.o. female.   Sandra Bell presents with complaints of right upper arm pain which started 3 days ago after getting an injection of INVEGA TRINZA. She states that she tends to have soreness to injection site with this monthly injection but this feels different and more severe. Pain with lifting the arm or any movement. No numbness tingling or weakness. No other injury. She is right handed. No redness or swelling. No fevers. No pain to elbow or wrist. No hand or finger pain. Hasn't taken any medications for her symptoms. Hx of htn, schizoaffective.     ROS per HPI, negative if not otherwise mentioned.      Past Medical History:  Diagnosis Date  . Schizoaffective disorder [facility]) 2003    Patient Active Problem List   Diagnosis Date Noted  . Health care maintenance 11/21/2012  . HTN (hypertension) 02/05/2012  . Schizoaffective disorder (HCC) 01/03/2012  . HLD (hyperlipidemia) 01/03/2012    Past Surgical History:  Procedure Laterality Date  . FINGER SURGERY  1983    OB History   No obstetric history on file.      Home Medications    Prior to Admission medications   Medication Sig Start Date End Date Taking? Authorizing Provider  aspirin 81 MG tablet Take 81 mg by mouth daily.    [provider]  hydrochlorothiazide (MICROZIDE) 12.5 MG capsule Take 1 capsule (12.5 mg total) by mouth daily. 09/19/17   Diallo, Lilia Argue, MD  hydrochlorothiazide (MICROZIDE) 12.5 MG capsule TAKE 1 CAPSULE(12.5 MG) BY MOUTH DAILY 09/30/17 06/27/18  Diallo, Lilia Argue, MD  naproxen (NAPROSYN) 375 MG tablet Take 1 tablet (375 mg total) by mouth 2 (two) times daily. 07/04/18   Georgetta Haber, NP  rosuvastatin (CRESTOR) 20 MG tablet Take 1 tablet (20 mg total) by mouth daily. 04/07/18    Lovena Neighbours, MD    Family History Family History  Problem Relation Age of Onset  . Hyperlipidemia Mother   . Hypertension Mother   . Hyperlipidemia Father   . Hypertension Father   . Heart disease Father     Social History Social History   Tobacco Use  . Smoking status: Never Smoker  . Smokeless tobacco: Never Used  Substance Use Topics  . Alcohol use: No  . Drug use: No     Allergies   Patient has no known allergies.   Review of Systems Review of Systems   Physical Exam Triage Vital Signs ED Triage Vitals  Enc Vitals Group     BP 07/04/18 1525 (!) 141/92     Pulse Rate 07/04/18 1525 (!) 117     Resp 07/04/18 1525 16     Temp 07/04/18 1525 98.6 F (37 C)     Temp Source 07/04/18 1525 Oral     SpO2 07/04/18 1525 100 %     Weight --      Height --      Head Circumference --      Peak Flow --      Pain Score 07/04/18 1527 10     Pain Loc --      Pain Edu? --      Excl. in GC? --    No data found.  Updated Vital Signs BP Marland Kitchen)  141/92 (BP Location: Right Arm)   Pulse (!) 117   Temp 98.6 F (37 C) (Oral)   Resp 16   SpO2 100%   Visual Acuity Right Eye Distance:   Left Eye Distance:   Bilateral Distance:    Right Eye Near:   Left Eye Near:    Bilateral Near:     Physical Exam Constitutional:      General: She is not in acute distress.    Appearance: She is well-developed.  Cardiovascular:     Rate and Rhythm: Normal rate and regular rhythm.     Heart sounds: Normal heart sounds.  Pulmonary:     Effort: Pulmonary effort is normal.     Breath sounds: Normal breath sounds.  Musculoskeletal:     Right shoulder: She exhibits decreased range of motion, tenderness and pain. She exhibits no bony tenderness, no swelling, no effusion, no crepitus, no deformity, no laceration, no spasm, normal pulse and normal strength.     Comments: Right deltoid with tenderness on palpation; pain with any engagement of deltoid with abduction or rotation; elbow  and wrist WNL; cap refill < 2 seconds ; gross sensation intact ; no redness, swelling, firmness; no bony tenderness   Skin:    General: Skin is warm and dry.  Neurological:     Mental Status: She is alert and oriented to person, place, and time.      UC Treatments / Results  Labs (all labs ordered are listed, but only abnormal results are displayed) Labs Reviewed - No data to display  EKG None  Radiology No results found.  Procedures Procedures (including critical care time)  Medications Ordered in UC Medications - No data to display  Initial Impression / Assessment and Plan / UC Course  I have reviewed the triage vital signs and the nursing notes.  Pertinent labs & imaging results that were available during my care of the patient were reviewed by me and considered in my medical decision making (see chart for details).     Pain is isolated to deltoid, suspect local injection reaction/soreness. Warm compresses, activity, naproxen prn recommended. Follow up with PCP as needed. Patient verbalized understanding and agreeable to plan.   Final Clinical Impressions(s) / UC Diagnoses   Final diagnoses:  Pain of right deltoid     Discharge Instructions     Light and regular activity as tolerated. See exercises provided.  Heat application may be helpful.  May take naproxen twice a day as needed for pain, take with food.  If develop increased pain, redness, swelling, numbness tingling cough, shortness of breath , or otherwise worsening please go to the ER.  If pain persists please follow up with your primary care provider.    ED Prescriptions    Medication Sig Dispense Auth. Provider   naproxen (NAPROSYN) 375 MG tablet Take 1 tablet (375 mg total) by mouth 2 (two) times daily. 20 tablet Georgetta HaberBurky, Jacole Capley B, NP     Controlled Substance Prescriptions [place] Controlled Substance Registry consulted? Not Applicable   Georgetta HaberBurky, Guadalupe Kerekes B, NP 07/04/18 [phone]

## 2018-07-06 NOTE — Progress Notes (Deleted)
  Subjective:   Patient ID: Sandra Bell    DOB: 1970/04/08, 48 y.o. female   MRN: [id number]  Sandra Bell is a 48 y.o. female with a history of HTN, schizoaffective d/o, HLD here for   Shoulder pain - seen on 4/24 at [facility] for R deltoid pain after getting IM injection of invega trinza with [facility], more severe than usual for monthly injection. Normal exam at that time except for TTP. Recommended warm compresses, exercises, naproxen prn. - R handed.  EXTREMITY PAIN  Pain is: *** Location: *** Pain started: *** Pain prevents: *** Medications tried: *** Recent trauma: *** Similar pain previously: ***  Symptoms Redness:*** Swelling:*** Fever: *** Weakness: *** Weight loss: *** Rash: ***  Review of Systems:  Per HPI.  PMFSH, medications and smoking status reviewed.  Objective:   There were no vitals taken for this visit. Vitals and nursing note reviewed.  General: well nourished, well developed, in no acute distress with non-toxic appearance HEENT: normocephalic, atraumatic, moist mucous membranes Neck: supple, non-tender without lymphadenopathy CV: regular rate and rhythm without murmurs, rubs, or gallops, no lower extremity edema Lungs: clear to auscultation bilaterally with normal work of breathing Abdomen: soft, non-tender, non-distended, no masses or organomegaly palpable, normoactive bowel sounds Skin: warm, dry, no rashes or lesions Extremities: warm and well perfused, normal tone MSK: ROM grossly intact, strength intact, gait normal Neuro: Alert and oriented, speech normal  Assessment & Plan:   No problem-specific Assessment & Plan notes found for this encounter.  No orders of the defined types were placed in this encounter.  No orders of the defined types were placed in this encounter.   Ellwood Dense, DO PGY-2, [facility] 07/06/2018 4:59 PM

## 2018-07-07 ENCOUNTER — Ambulatory Visit: Payer: Self-pay

## 2018-08-07 ENCOUNTER — Other Ambulatory Visit: Payer: Self-pay

## 2018-08-07 ENCOUNTER — Encounter: Payer: Self-pay | Admitting: Family Medicine

## 2018-08-07 ENCOUNTER — Ambulatory Visit (INDEPENDENT_AMBULATORY_CARE_PROVIDER_SITE_OTHER): Payer: Self-pay | Admitting: Family Medicine

## 2018-08-07 VITALS — BP 110/80 | HR 93 | Ht 65.0 in | Wt 142.2 lb

## 2018-08-07 DIAGNOSIS — I10 Essential (primary) hypertension: Secondary | ICD-10-CM

## 2018-08-07 DIAGNOSIS — E782 Mixed hyperlipidemia: Secondary | ICD-10-CM

## 2018-08-07 MED ORDER — SIMVASTATIN 40 MG PO TABS: 40.0000 mg | ORAL_TABLET | Freq: Every day | ORAL | 0 refills | Status: DC

## 2018-08-07 NOTE — Addendum Note (Signed)
Addended by: Lovena Neighbours F on: 08/07/2018 11:35 AM   Modules accepted: Orders

## 2018-08-07 NOTE — Assessment & Plan Note (Signed)
Cholesterol panel from January 2020 with a total cholesterol 268, HDL 55, LDL 194.  Patient was unable to afford Crestor will start patient on Zocor 40mg  daily since it is available on the [facility] list for $10.  Patient will need transfer assistance going forward as she is able to afford it.

## 2018-08-07 NOTE — Assessment & Plan Note (Signed)
Blood pressure today 110/80.  Patient is at goal we will continue HCTZ 12.5 mg daily.  Patient will need repeat BMP at next office visit.  Try to minimize lab work given patient, patient is being out-of-pocket.

## 2018-08-07 NOTE — Progress Notes (Signed)
   Subjective:    Patient ID: Sandra Bell, female    DOB: Jul 02, 1970, 48 y.o.   MRN: [id number]   CC: Follow-up for hypertension hyperlipidemia  HPI: Patient is a 48 year old female with past medical history significant for hypertension, schizophrenia, hyperlipidemia who presents today to follow-up on hypertension and hyperlipidemia management.  Patient reports that she has been taking her HCTZ 12.5 mg daily with no side effects.  She denies any dizziness, headache, vision change, chest pain or shortness of breath.  Patient has been unable to afford her cholesterol medication as she does not have insurance and lives with her mom who is on a fixed income.  She continue to receive her Invega depot every 3 months.  Smoking status reviewed   ROS: all other systems were reviewed and are negative other than in the HPI   Past Medical History:  Diagnosis Date  . Schizoaffective disorder (HCC) 2003    Past Surgical History:  Procedure Laterality Date  . FINGER SURGERY  1983    Past medical history, surgical, family, and social history reviewed and updated in the EMR as appropriate.  Objective:  BP 110/80   Pulse 93   Ht 5\' 5"  (1.651 m)   Wt 142 lb 4 oz (64.5 kg)   SpO2 99%   BMI 23.67 kg/m   Vitals and nursing note reviewed  General: NAD, pleasant, able to participate in exam Cardiac: RRR, normal heart sounds, no murmurs. 2+ radial and PT pulses bilaterally Respiratory: CTAB, normal effort, No wheezes, rales or rhonchi Abdomen: soft, nontender, nondistended, no hepatic or splenomegaly, +BS Extremities: no edema or cyanosis. WWP. Skin: warm and dry, no rashes noted Neuro: alert and oriented x4, no focal deficits Psych: Normal affect and mood   Assessment & Plan:    HTN (hypertension) Blood pressure today 110/80.  Patient is at goal we will continue HCTZ 12.5 mg daily.  Patient will need repeat BMP at next office visit.  Try to minimize lab work given patient, patient is  being out-of-pocket.  HLD (hyperlipidemia) Cholesterol panel from January 2020 with a total cholesterol 268, HDL 55, LDL 194.  Patient was unable to afford Crestor will start patient on Zocor 40mg  daily since it is available on the [facility] list for $10.  Patient will need transfer assistance going forward as she is able to afford it.    Lovena Neighbours, MD [facility] PGY-3

## 2018-08-07 NOTE — Patient Instructions (Signed)
It was great seeing you today! We have addressed the following issues today  1. Your blood pressure today is good as expected continue with your current medication. 2. I am starting you on Simvastatin 40 mg for your cholesterol, you will take it once a day in the morning. 3. Follow up in 3-6 months when we will repeat some of your blood work.  If we did any lab work today, and the results require attention, either me or my nurse will get in touch with you. If everything is normal, you will get a letter in mail and a message via . If you don't hear from [place] in two weeks, please give [place] a call. Otherwise, we look forward to seeing you again at your next visit. If you have any questions or concerns before then, please call the clinic at [phone].  Please bring all your medications to every doctors visit  Sign up for My Chart to have easy access to your labs results, and communication with your Primary care physician. Please ask Front Desk for some assistance.   Please check-out at the front desk before leaving the clinic.    Take Care,   Dr. Sydnee Cabal

## 2018-10-17 ENCOUNTER — Other Ambulatory Visit: Payer: Self-pay

## 2018-10-17 DIAGNOSIS — I10 Essential (primary) hypertension: Secondary | ICD-10-CM

## 2018-10-18 MED ORDER — HYDROCHLOROTHIAZIDE 12.5 MG PO CAPS: 12.5000 mg | ORAL_CAPSULE | Freq: Every day | ORAL | 3 refills | Status: DC

## 2019-03-04 ENCOUNTER — Ambulatory Visit (INDEPENDENT_AMBULATORY_CARE_PROVIDER_SITE_OTHER): Payer: Self-pay | Admitting: Family Medicine

## 2019-03-04 ENCOUNTER — Encounter: Payer: Self-pay | Admitting: Family Medicine

## 2019-03-04 ENCOUNTER — Other Ambulatory Visit: Payer: Self-pay

## 2019-03-04 VITALS — BP 125/82 | HR 83 | Temp 98.5°F | Ht 65.0 in | Wt 137.0 lb

## 2019-03-04 DIAGNOSIS — I10 Essential (primary) hypertension: Secondary | ICD-10-CM

## 2019-03-04 DIAGNOSIS — E785 Hyperlipidemia, unspecified: Secondary | ICD-10-CM

## 2019-03-04 DIAGNOSIS — F258 Other schizoaffective disorders: Secondary | ICD-10-CM

## 2019-03-04 DIAGNOSIS — Z124 Encounter for screening for malignant neoplasm of cervix: Secondary | ICD-10-CM | POA: Insufficient documentation

## 2019-03-04 DIAGNOSIS — Z1211 Encounter for screening for malignant neoplasm of colon: Secondary | ICD-10-CM | POA: Insufficient documentation

## 2019-03-04 DIAGNOSIS — Z1159 Encounter for screening for other viral diseases: Secondary | ICD-10-CM

## 2019-03-04 NOTE — Assessment & Plan Note (Addendum)
Potentially related to her current antipsychotic regimen.  Last lipid panel showed elevated LDL.  Not currently taking medication.  She was informed that she can switch to another medication without side effects but she reported that she would not be able to afford it. -Follow-up with Neoma Laming regarding Medicaid -Recommend atorvastatin for an expensive statin once she is in a better financial situation

## 2019-03-04 NOTE — Assessment & Plan Note (Signed)
Stable. -Continue hydrochlorothiazide at 12.5 mg daily -Follow-up CMP

## 2019-03-04 NOTE — Assessment & Plan Note (Signed)
Continue to follow with [facility]. -Follow-up lipid panel

## 2019-03-04 NOTE — Progress Notes (Signed)
    Subjective:  Sandra Bell is a 48 y.o. female who presents to the [facility] today for her annual physical.   HPI:  Hypertension Blood pressure is well controlled today.  She denies any complaints of lightheadedness or hypertension.  Hyperlipidemia Her last lipid panel showed elevations of her LDL.  She has previously been prescribed simvastatin.  She reports that she no longer takes simvastatin because she thought that it gave her tachycardia.  She has been mindful of her diet recently but is aware that her antipsychotic medication can increase her cholesterol.  Schizoaffective disorder She is currently monitored by [facility] and is treated with paliperidone injections every 3 months.  She notes that this can increase her cholesterol.  Routine cancer screening Due for Pap smear and colon cancer screening.  Chief Complaint noted Review of Symptoms - see HPI PMH -hypertension, schizophrenia, non-smoker, not currently sexually active  Objective:  Physical Exam: BP 125/82   Pulse 83   Temp 98.5 F (36.9 C) (Oral)   Ht 5\' 5"  (1.651 m)   Wt 137 lb (62.1 kg)   SpO2 99%   BMI 22.80 kg/m    Gen: NAD, resting comfortably CV: RRR with no murmurs appreciated Pulm: NWOB, CTAB with no crackles, wheezes, or rhonchi GI: Normal bowel sounds present. Soft, Nontender, Nondistended. MSK: no edema, cyanosis, or clubbing noted  Skin: warm, dry Neuro: grossly normal, moves all extremities  No results found for this or any previous visit (from the past 72 hour(s)).   Assessment/Plan:  HTN (hypertension) Stable. -Continue hydrochlorothiazide at 12.5 mg daily -Follow-up CMP  Schizoaffective disorder ([place]) Continue to follow with [facility]. -Follow-up lipid panel  HLD (hyperlipidemia) Potentially related to her current antipsychotic regimen.  Last lipid panel showed elevated LDL.  Not currently taking medication.  She was informed that she can switch to another medication without side  effects but she reported that she would not be able to afford it. -Follow-up with Neoma Laming regarding Medicaid -Recommend atorvastatin for an expensive statin once she is in a better financial situation  Screening for cervical cancer 5 years since last Pap smear with HPV cotesting.  Not interested in doing a Pap smear today for financial reasons. -She was encouraged to follow-up for Pap smear once her financial situation improved  Screening for colon cancer She noted that she would not like to do any screening for cancer today because of her current financial situation. -We will refer for colonoscopy when her financial situation improves

## 2019-03-04 NOTE — Patient Instructions (Addendum)
Everything looks good today. We are going to get some blood work today and I will let you know about the results.  I will also reach out to our social worker about helping you apply for Medicaid.  I would like to able to get some of those other screenings for you that would be helpful.

## 2019-03-04 NOTE — Assessment & Plan Note (Signed)
5 years since last Pap smear with HPV cotesting.  Not interested in doing a Pap smear today for financial reasons. -She was encouraged to follow-up for Pap smear once her financial situation improved

## 2019-03-04 NOTE — Assessment & Plan Note (Signed)
She noted that she would not like to do any screening for cancer today because of her current financial situation. -We will refer for colonoscopy when her financial situation improves

## 2019-03-05 LAB — COMPREHENSIVE METABOLIC PANEL
ALT: 10 IU/L (ref 0–32)
AST: 16 IU/L (ref 0–40)
Albumin/Globulin Ratio: 1.5 (ref 1.2–2.2)
Albumin: 4.5 g/dL (ref 3.8–4.8)
Alkaline Phosphatase: 52 IU/L (ref 39–117)
BUN/Creatinine Ratio: 9 (ref 9–23)
BUN: 8 mg/dL (ref 6–24)
Bilirubin Total: 0.8 mg/dL (ref 0.0–1.2)
CO2: 24 mmol/L (ref 20–29)
Calcium: 10.3 mg/dL — ABNORMAL HIGH (ref 8.7–10.2)
Chloride: 102 mmol/L (ref 96–106)
Creatinine, Ser: 0.91 mg/dL (ref 0.57–1.00)
GFR calc Af Amer: 85 mL/min/{1.73_m2} (ref >59)
GFR calc non Af Amer: 74 mL/min/{1.73_m2} (ref >59)
Globulin, Total: 3.1 g/dL (ref 1.5–4.5)
Glucose: 79 mg/dL (ref 65–99)
Potassium: 3.8 mmol/L (ref 3.5–5.2)
Sodium: 143 mmol/L (ref 134–144)
Total Protein: 7.6 g/dL (ref 6.0–8.5)

## 2019-03-05 LAB — LIPID PANEL
Chol/HDL Ratio: 4.2 ratio (ref 0.0–4.4)
Cholesterol, Total: 269 mg/dL — ABNORMAL HIGH (ref 100–199)
HDL: 64 mg/dL (ref >39)
LDL Chol Calc (NIH): 186 mg/dL — ABNORMAL HIGH (ref 0–99)
Triglycerides: 107 mg/dL (ref 0–149)
VLDL Cholesterol Cal: 19 mg/dL (ref 5–40)

## 2019-03-05 LAB — HEPATITIS C ANTIBODY: Hep C Virus Ab: <0.1 s/co ratio (ref 0.0–0.9)

## 2019-03-10 ENCOUNTER — Ambulatory Visit: Payer: Self-pay | Admitting: Licensed Clinical Social Worker

## 2019-03-10 NOTE — Chronic Care Management (AMB) (Signed)
   Social Work  Care Management Consultation  03/10/2019 Name: Sandra Bell MRN: [id number] DOB: Sep 03, 1970   [name] is a 48 y.o. year old female who sees Matilde Haymaker, MD for primary care. LCSW was consulted by Dr. Pilar Plate for information /resources to assistance patient with  Medication Assistance and community resources. Patient was not seen during this encounter however LCSW reviewed chart, notes, insurance and collaboration with PCP .    Intervention: Relevant information and resources discussed and shared with provider. Provider will give information to patient. Medical coverage options  Medicaid  - local Department of social Services on online applications  DEPARTMENT OF SOCIAL SERVICES: [place], [place] [address] [phone]  [place] [email or web address]   Call [phone]  [place] [employer]   www.[email or web address].de   [phone]   [employer]- Pick up application at the front desk  Who Does the [employer]? The [facility] serves any patient with an income between 0%-200% of the federal poverty level.  [facility] patients must be exempt from the [place] in order to enroll into the [facility].  Medication Assistance  The [facility] Department of Public Health's Medication Assistance Program (MAP) may be able to help. Who is eligible for MAP services?  Marland Kitchen Adult [facility] residents who have incomes at or below 200% of the [employer] Poverty Scale ($21,660 if single, $29,140 if married). MAP offices are located at the [place]:  . [address], [place], [place], [place], [address], [place], [place].  [phone]   Plan: The care management team is available to follow up with the patient after provider's conversation with the patient regarding recommendation for care management engagement and subsequent referral to the care management team.   No further follow up required by LCSW at this time  Casimer Lanius, [place] [facility] [place]   [phone] 8:59 AM

## 2019-03-18 ENCOUNTER — Telehealth: Payer: Self-pay | Admitting: Family Medicine

## 2019-03-18 ENCOUNTER — Other Ambulatory Visit: Payer: Self-pay | Admitting: Family Medicine

## 2019-03-18 DIAGNOSIS — Z1211 Encounter for screening for malignant neoplasm of colon: Secondary | ICD-10-CM

## 2019-03-18 DIAGNOSIS — Z124 Encounter for screening for malignant neoplasm of cervix: Secondary | ICD-10-CM

## 2019-03-18 NOTE — Telephone Encounter (Signed)
Pt called. Pt open to conversation with social work about community resources.

## 2019-03-18 NOTE — Telephone Encounter (Signed)
Pt is returning Dr. Cardell Peach call and would like for him to call her back. The best call back number is [phone].

## 2019-03-18 NOTE — Telephone Encounter (Signed)
Called and left message with Novelle Addair regarding a consult to chronic care management.  If Ms. Burnstein calls back, please ask if she would be willing to have a social work call her to discuss possible community resources that would help her get more access to care for things like a Pap smear, colonoscopy and medication.  If she is open to this conversation, I will put in a chronic care management-social work referral.

## 2019-03-24 ENCOUNTER — Ambulatory Visit: Payer: Self-pay | Admitting: Licensed Clinical Social Worker

## 2019-03-24 NOTE — Chronic Care Management (AMB) (Signed)
  Social Work Care Management  Outreach Note  03/24/2019 Name: Sandra Bell MRN: [id number] DOB: 1971/02/18  Referred by: Mirian Mo, MD Reason for referral : Care Coordination (assess for resources)  An unsuccessful telephone outreach was attempted today. The patient was referred to the case management team for assessing needs for insurance options as well as Cancer screening community resources.A HIPPA compliant phone message was left for the patient providing contact information and requesting a return call.   Follow Up Plan: if no return call is received, LCSW will call patient again in 3 to 7 days.   If patient calls in please place her on CCM Social Work schedule for a 15 min phone call.   Sammuel Hines, LCSW Clinical Social Worker [facility] / [facility]   [phone] 3:17 PM

## 2019-03-25 ENCOUNTER — Other Ambulatory Visit: Payer: Self-pay

## 2019-03-25 ENCOUNTER — Ambulatory Visit: Payer: Self-pay | Admitting: Licensed Clinical Social Worker

## 2019-03-25 DIAGNOSIS — F258 Other schizoaffective disorders: Secondary | ICD-10-CM

## 2019-03-25 DIAGNOSIS — Z7189 Other specified counseling: Secondary | ICD-10-CM

## 2019-03-25 DIAGNOSIS — I10 Essential (primary) hypertension: Secondary | ICD-10-CM

## 2019-03-25 NOTE — Chronic Care Management (AMB) (Signed)
  Social Work Care Management  Initial Visit Note  03/25/2019 Name: Sandra Bell MRN: [id number] DOB: [birth year]  Assessment: Sandra Bell is a 49 y.o. year old female who sees Matilde Haymaker, MD for primary care. LCSW was consulted for assistance with care coordination needs related to connecting patient with health maintenance resources for cancer screening.  Phone encounter with patient today to assess needs and provide interventions.  Review of patient status, including review of consultants reports, relevant laboratory and other test results, and collaboration with appropriate care team members and the patient's provider was performed as part of comprehensive patient evaluation and provision of care management services.    SDOH (Social Determinants of Health) screening performed today: . See Care Plan for related entries.  Outpatient Encounter Medications as of 03/25/2019  Medication Sig  . aspirin 81 MG tablet Take 81 mg by mouth daily.  . hydrochlorothiazide (MICROZIDE) 12.5 MG capsule TAKE 1 CAPSULE(12.5 MG) BY MOUTH DAILY  . hydrochlorothiazide (MICROZIDE) 12.5 MG capsule Take 1 capsule (12.5 mg total) by mouth daily.  . naproxen (NAPROSYN) 375 MG tablet Take 1 tablet (375 mg total) by mouth 2 (two) times daily.  . simvastatin (ZOCOR) 40 MG tablet Take 1 tablet (40 mg total) by mouth at bedtime.   No facility-administered encounter medications on file as of 03/25/2019.   Goals Addressed            This Visit's Progress   . Needs Health Care Maintence       Current Barriers:  . Unmet health care screening for patient 49 years old with HTN and schizoaffective disorder . Patient needs support and education,in order to connect to available community resources due to not having medical insurance. Clinical Goal(s)   Over the next 30 days, patient will contact Breast & Cervical Cancer Control Program to complete application for free cancer screenings. [phone] Interventions :   . Assessed patient's care coordination needs.  All mental health needs are met by [facility].   Provided patient with information about Breast & Cervical Cancer Control Program,  Be Smart Family Planning [phone], Medication Assistance Program [phone], discussed the [employer], Applying for Medicaid,   Advised patient to call Breast & Cervical Cancer Control Program  Patient Self Care Activities & Deficits:  . Patient is able to contact resources discussed today . Acknowledges deficits related to resources and is motivated to get screenings  Initial goal documentation     plan:  1. Patient will contact LCSW if additional information is needed 2. No Additional F/U needed by LCSW at this time  Casimer Lanius, [place] / [place]   [phone] 9:10 AM

## 2019-03-25 NOTE — Patient Instructions (Signed)
Licensed Clinical Social Worker Visit Information Ms. Salmons  it was nice speaking with you. Please call me directly if you have questions [phone] Goals we discussed today:  Goals Addressed            This Visit's Progress   . Needs Health Care Maintence       Current Barriers:  . Unmet health care screening for patient 49 years old with HTN and schizoaffective disorder . Patient needs support and education,in order to connect to available community resources Clinical Goal(s)  . Over the next 30 days, patient will contact Breast & Cervical Cancer Control Program to complete application for free cancer screenings. [phone] Interventions :  . Assessed patient's care coordination needs.  All mental health needs are met by [facility].  . Provided patient with information about Breast & Cervical Cancer Control Program,  Be Smart Family Planning [phone], Medication Assistance Program [phone], discussed the [facility], Applying for Medicaid,  . Advised patient to call Breast & Cervical Cancer Control Program  Patient Self Care Activities & Deficits:  . Patient is able to contact resources discussed today . Acknowledges deficits related to resources and is motivated to get screenings  Initial goal documentation      Materials provided:  Ms. Speelman was given information about Care Management services today including:  1. Care Management services include personalized support from designated clinical staff supervised by her physician, including individualized plan of care and coordination with other care providers 2. 24/7 contact [phone] for assistance for urgent and routine care needs. 3. Care Management services at any time by phone call to the office staff.   Patient agreed to services provided today.    The patient verbalized understanding of instructions provided today and declined a print copy of patient instruction materials.  Follow up plan: no F/U required   Maurine Cane, LCSW

## 2019-03-27 ENCOUNTER — Other Ambulatory Visit (HOSPITAL_COMMUNITY): Payer: Self-pay | Admitting: *Deleted

## 2019-03-27 ENCOUNTER — Telehealth: Payer: Self-pay

## 2019-03-27 DIAGNOSIS — Z1231 Encounter for screening mammogram for malignant neoplasm of breast: Secondary | ICD-10-CM

## 2019-04-28 ENCOUNTER — Other Ambulatory Visit: Payer: Self-pay

## 2019-04-28 DIAGNOSIS — Z1231 Encounter for screening mammogram for malignant neoplasm of breast: Secondary | ICD-10-CM

## 2019-04-30 ENCOUNTER — Ambulatory Visit: Payer: Self-pay

## 2019-05-21 ENCOUNTER — Other Ambulatory Visit: Payer: Self-pay | Admitting: Obstetrics and Gynecology

## 2019-05-21 ENCOUNTER — Encounter (INDEPENDENT_AMBULATORY_CARE_PROVIDER_SITE_OTHER): Payer: Self-pay

## 2019-05-21 ENCOUNTER — Ambulatory Visit
Admission: RE | Admit: 2019-05-21 | Discharge: 2019-05-21 | Disposition: A | Discharge status: Home / Self Care | Payer: No Typology Code available for payment source | Source: Ambulatory Visit | Attending: Obstetrics and Gynecology | Admitting: Obstetrics and Gynecology

## 2019-05-21 ENCOUNTER — Ambulatory Visit: Payer: Self-pay | Admitting: Student

## 2019-05-21 ENCOUNTER — Other Ambulatory Visit: Payer: Self-pay

## 2019-05-21 VITALS — BP 120/84 | Temp 97.1°F | Wt 140.0 lb

## 2019-05-21 DIAGNOSIS — Z124 Encounter for screening for malignant neoplasm of cervix: Secondary | ICD-10-CM

## 2019-05-21 DIAGNOSIS — Z1231 Encounter for screening mammogram for malignant neoplasm of breast: Secondary | ICD-10-CM

## 2019-05-21 DIAGNOSIS — N63 Unspecified lump in unspecified breast: Secondary | ICD-10-CM

## 2019-05-21 DIAGNOSIS — N644 Mastodynia: Secondary | ICD-10-CM

## 2019-05-21 NOTE — Progress Notes (Signed)
[name] is a 49 y.o. No obstetric history on file. female who presents to [facility] clinic today with complaint of left breast tightening. Only hurts when you touch. No discharge. Not sure if lump is felt. Her sister had breast cancer and is in remission.    Pap Smear: Pap smear completed today. Last Pap smear was 2015  At [facility] family practice clinic and was normal. Per patient has no history of an abnormal Pap smear. Last Pap smear result is available in Epic.   Physical exam: Breasts Breasts symmetrical. No skin abnormalities bilateral breasts. No nipple retraction bilateral breasts. No nipple discharge bilateral breasts. No lymphadenopathy. No lumps palpated bilateral breasts.       Pelvic/Bimanual Ext Genitalia No lesions, no swelling and no discharge observed on external genitalia.        Vagina Vagina pink and normal texture. No lesions or discharge observed in vagina.        Cervix Cervix is present. Cervix pink and of normal texture. No discharge observed.    Uterus Uterus is present and palpable. Uterus in normal position and normal size.        Adnexae Bilateral ovaries present and palpable. No tenderness on palpation.         Rectovaginal No rectal exam completed today since patient had no rectal complaints. No skin abnormalities observed on exam.     Smoking History: Patient has never smoked   Patient Navigation: Patient education provided. Access to services provided for patient through BCCCP program.   Colorectal Cancer Screening: Per patient has never had colonoscopy completed No complaints today.    Breast and Cervical Cancer Risk Assessment: Patient has family history of breast cancer, known genetic mutations, or radiation treatment to the chest before age 63. Patient does not have history of cervical dysplasia, immunocompromised, or DES exposure in-utero.  Risk Assessment    Risk Scores      05/21/2019   Last edited by: Narda Rutherford, LPN    5-year risk: 1.4 %   Lifetime risk: 10.5 %          A: BCCCP exam with pap smear No lumps palpated.   P: Ref[facility] for a screening mammogram. Appointment scheduled 3/11.  Marylene Land, CNM 05/21/2019 12:08 PM

## 2019-05-25 ENCOUNTER — Encounter (HOSPITAL_COMMUNITY): Payer: Self-pay | Admitting: Family Medicine

## 2019-05-25 LAB — CYTOLOGY - PAP
Comment: NEGATIVE
Diagnosis: NEGATIVE
High risk HPV: NEGATIVE

## 2019-06-01 ENCOUNTER — Telehealth: Payer: Self-pay

## 2019-06-01 NOTE — Telephone Encounter (Signed)
Normal pap letter mailed

## 2019-06-18 ENCOUNTER — Ambulatory Visit
Admission: RE | Admit: 2019-06-18 | Discharge: 2019-06-18 | Disposition: A | Discharge status: Home / Self Care | Payer: No Typology Code available for payment source | Source: Ambulatory Visit | Attending: Obstetrics and Gynecology | Admitting: Obstetrics and Gynecology

## 2019-06-18 ENCOUNTER — Other Ambulatory Visit: Payer: Self-pay

## 2019-06-18 ENCOUNTER — Other Ambulatory Visit: Payer: Self-pay | Admitting: Obstetrics and Gynecology

## 2019-06-18 DIAGNOSIS — N63 Unspecified lump in unspecified breast: Secondary | ICD-10-CM

## 2019-06-18 DIAGNOSIS — R599 Enlarged lymph nodes, unspecified: Secondary | ICD-10-CM

## 2019-06-18 DIAGNOSIS — N644 Mastodynia: Secondary | ICD-10-CM

## 2019-07-01 ENCOUNTER — Other Ambulatory Visit: Payer: No Typology Code available for payment source

## 2019-08-25 ENCOUNTER — Other Ambulatory Visit: Payer: Self-pay

## 2019-08-25 ENCOUNTER — Ambulatory Visit (INDEPENDENT_AMBULATORY_CARE_PROVIDER_SITE_OTHER): Payer: No Payment, Other | Admitting: Licensed Clinical Social Worker

## 2019-08-25 DIAGNOSIS — F251 Schizoaffective disorder, depressive type: Secondary | ICD-10-CM | POA: Diagnosis not present

## 2019-08-25 NOTE — Progress Notes (Signed)
Comprehensive Clinical Assessment (CCA) Note  08/25/2019 Sandra Bell [id number]  Visit Diagnosis:      ICD-10-CM   1. Schizoaffective disorder, depressive type [facility])  F25.1     Virtual Visit via Telephone Note  I connected with Cher Nakai on 08/25/19 at  2:00 PM EDT by telephone and verified that I am speaking with the correct person using two identifiers.  Location: Patient: Sandra Bell  Provider: [facility]    I discussed the limitations, risks, security and privacy concerns of performing an evaluation and management service by telephone and the availability of in person appointments. I also discussed with the patient that there may be a patient responsible charge related to this service. The patient expressed understanding and agreed to proceed.  I discussed the assessment and treatment plan with the patient. The patient was provided an opportunity to ask questions and all were answered. The patient agreed with the plan and demonstrated an understanding of the instructions.   The patient was advised to call back or seek an in-person evaluation if the symptoms worsen or if the condition fails to improve as anticipated.  I provided 60  minutes of non-face-to-face time during this encounter.   Dory Horn, LCSW   CCA Screening, Triage and Referral (STR)  Patient Reported Information How did you hear about [place]? Other (Comment)  Referral name: [facility]   Have You Recently Been in Any Inpatient Treatment (Hospital/Detox/Crisis Center/28-Day Program)? No   Have You Ever Received Services From [employer] Before? No   Have You Recently Had Any Thoughts About Hurting Yourself? No  Are You Planning to Commit Suicide/Harm Yourself At This time? No   Have you Recently Had Thoughts About [place]? No   Have You Used Any Alcohol or Drugs in the Past 24 Hours? No   Do You Currently Have a Therapist/Psychiatrist? No  Have You Been Recently  Discharged From Any Office Practice or Programs? No     CCA Screening Triage Referral Assessment Type of Contact: Phone Call   Patient Reported Information Reviewed? Yes   Collateral Involvement: none   Is CPS involved or ever been involved? Never  Is APS involved or ever been involved? Never   Patient Determined To Be At Risk for Harm To Self or Others Based on Review of Patient Reported Information or Presenting Complaint? No   Location of Assessment: [facility] Assessment Services   Does Patient Present under Involuntary Commitment? No  IVC Papers Initial File Date: No data recorded  [place] of Residence: Sandra Bell    CCA Biopsychosocial  Intake/Chief Complaint:  CCA Intake With Chief Complaint Chief Complaint/Presenting Problem: schzioeffective Patient's Currently Reported Symptoms/Problems: halluciatntion: negative thoughts but not harmful ones, flat eaffect, irritability, low motivation Individual's Strengths: determined Individual's Abilities: sewing, piano Type of Services Patient Feels Are Needed: therapy and medication mgnt  Mental Health Symptoms Depression:  Depression: Change in energy/activity, Irritability, Fatigue  Mania:     Anxiety:      Psychosis:  Psychosis: Hallucinations, Other negative symptoms  Trauma:     Obsessions:     Compulsions:     Inattention:     Hyperactivity/Impulsivity:     Oppositional/Defiant Behaviors:     Emotional Irregularity:     Other Mood/Personality Symptoms:      Mental Status Exam Appearance and self-care  Stature:  Stature: Average  Weight:  Weight: Average weight  Clothing:     Grooming:  Grooming:  (phone assessment)  Cosmetic use:  Cosmetic  Use: None  Posture/gait:  Posture/Gait: Normal  Motor activity:  Motor Activity: Slowed  Sensorium  Attention:  Attention: Persistent  Concentration:  Concentration: Normal  Orientation:  Orientation: X5  Recall/memory:     Affect and Mood  Affect:  Affect: Anxious   Mood:  Mood: Anxious  Relating  Eye contact:  Eye Contact: None (phone interview)  Facial expression:     Attitude toward examiner:     Thought and Language  Speech flow: Speech Flow: Slow  Thought content:  Thought Content: Appropriate to Mood and Circumstances  Preoccupation:     Hallucinations:     Organization:     Transport planner of Knowledge:  Fund of Knowledge: Fair  Intelligence:  Intelligence: Average  Abstraction:     Judgement:  Judgement: Fair  Art therapist:     Insight:  Insight: Fair  Decision Making:     Social Functioning  Social Maturity:     Social Judgement:     Stress  Stressors:  Stressors: Other (Comment), Grief/losses (keeps to self)  Coping Ability:     Skill Deficits:     Supports:        Religion: Religion/Spirituality Are You A Religious Person?: Yes What is Your Religious Affiliation?: Catholic How Might This Affect Treatment?: none  Leisure/Recreation: Leisure / Recreation Do You Have Hobbies?: Yes Leisure and Hobbies: dewing  Exercise/Diet: Exercise/Diet Do You Exercise?: No Have You Gained or Lost A Significant Amount of Weight in the Past Six Months?: No Do You Follow a Special Diet?: No Do You Have Any Trouble Sleeping?: Yes Explanation of Sleeping Difficulties: medication has managed insomnia   CCA Employment/Education  Employment/Work Situation: Employment / Work Situation Employment situation: Unemployed Patient's job has been impacted by current illness: Yes Describe how patient's job has been impacted: Pt unable to to work with low motivation What is the longest time patient has a held a job?: 10 years 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 Has patient ever been in the [employer]?: No  Education: Education Is Patient Currently Attending School?: No Last Grade Completed: 12 Did Teacher, adult education From [employer]?: Yes Did Physicist, medical?: Yes What Type of College Degree Do you  Have?: 2 years but did not finish Did You Attend Graduate School?: No Did You Have An Individualized Education Program (IIEP): No Did You Have Any Difficulty At School?: No Patient's Education Has Been Impacted by Current Illness: No   CCA Family/Childhood History  Family and Relationship History: Family history Marital status: Single Are you sexually active?: No  Childhood History:  Childhood History By whom was/is the patient raised?: Both parents Description of patient's relationship with caregiver when they were a child: good Does patient have siblings?: Yes Number of Siblings: 3 Description of patient's current relationship with siblings: good Did patient suffer any verbal/emotional/physical/sexual abuse as a child?: No Did patient suffer from severe childhood neglect?: No Has patient ever been sexually abused/assaulted/raped as an adolescent or adult?: No Was the patient ever a victim of a crime or a disaster?: No Witnessed domestic violence?: No Has patient been affected by domestic violence as an adult?: No     Client is a 49 year old Female . Client is referred by [facility] for a Schizoaffective disorder .   Client states mental health symptoms as evidenced by hallucinations confusion, depression or irritability. Client denies suicidal and homicidal ideations at this time  Client report hallucination     Assessment Information  that integrates subjective and objective details with a therapist's professional interpretation: LCSW and pt met with pt via telephone for 60 min. Pt reports she was from [facility] and referred to hear. Pt spoke with LCSW about SSDI that she has been talking with lawyer in regards to obtaining. She reports that lawyer are requesting further documenting. LCSW stated that pt would need more details about the documents needed as this was first assessment with pt and there were not many records for pt on file. Pt states she only wants to be seen every 3  months same as for her medication eval.     Client meets criteria for schizoaffective   Client states use of the following substances denied     Treatment recommendations are include plan :   Goals: Elevate mood and show evidence of usual energy, activities, and socialization level.; Reduce irritability and increase normal social interaction with family and friends.; Develop the ability to recognize, accept, and cope with feelings of depression; Alleviate depressed mood and return to previous level of effective functioning. Pt wants to manage symptom such as decrease voice in head, isolation, sadness, and irritability  Objectives Assist in teaching more about depression and accepting some sadness as a normal variation in feeling. Identify cognitive self-talk that is engaged in to support depression.          Clinician assisted client with scheduling the following appointments: 3 months. Clinician details of appointment.    Client agreed with treatment recommendations.    DSM5 Diagnoses: Patient Active Problem List   Diagnosis Date Noted  . Screening for cervical cancer 03/04/2019  . Screening for colon cancer 03/04/2019  . Health care maintenance 11/21/2012  . HTN (hypertension) 02/05/2012  . Schizoaffective disorder ([place]) 01/03/2012  . HLD (hyperlipidemia) 01/03/2012    Patient Centered Plan: Patient is on the following Treatment Plan(s):  Depression    Dory Horn

## 2019-09-01 ENCOUNTER — Ambulatory Visit (HOSPITAL_COMMUNITY): Payer: Self-pay

## 2019-09-01 ENCOUNTER — Encounter (HOSPITAL_COMMUNITY): Payer: Self-pay | Admitting: Psychiatry

## 2019-09-01 ENCOUNTER — Ambulatory Visit (INDEPENDENT_AMBULATORY_CARE_PROVIDER_SITE_OTHER): Payer: No Payment, Other | Admitting: Psychiatry

## 2019-09-01 ENCOUNTER — Ambulatory Visit (HOSPITAL_COMMUNITY): Payer: Self-pay | Admitting: *Deleted

## 2019-09-01 ENCOUNTER — Encounter (HOSPITAL_COMMUNITY): Payer: Self-pay

## 2019-09-01 ENCOUNTER — Other Ambulatory Visit: Payer: Self-pay

## 2019-09-01 VITALS — BP 140/91 | HR 85 | Temp 98.7°F | Ht 64.5 in | Wt 140.5 lb

## 2019-09-01 DIAGNOSIS — F251 Schizoaffective disorder, depressive type: Secondary | ICD-10-CM

## 2019-09-01 MED ORDER — PALIPERIDONE PALMITATE ER 819 MG/2.625ML IM SUSY
819.0000 mg | PREFILLED_SYRINGE | Freq: Once | INTRAMUSCULAR | Status: AC
  Administered 2019-09-01: 819 mg via INTRAMUSCULAR

## 2019-09-01 NOTE — Progress Notes (Signed)
Psychiatric Initial Adult Assessment   Patient Identification: Sandra Bell MRN:  [id number] Date of Evaluation:  09/01/2019   Referral Source: Beverly Sessions  Chief Complaint:   Chief Complaint    Injections     " I am doing fine."  Visit Diagnosis:    ICD-10-CM   1. Schizoaffective disorder, depressive type ([place])  F25.1     History of Present Illness: This is a 49 year old female with history of schizoaffective disorder now seen for continuity of care.  Patient reported that she has a long history of auditory and visual hallucinations and paranoid delusions.  She stated that she has been stable on Invega Trinza 819 mg for the past year or so and she believes that she is on the right track with this medicine. She reported that this medicine helps her with anxiety and also helps her sleep well.  She informed that other than this medication she only takes hydrochlorothiazide for hypertension and aspirin 81 mg daily.  She stated that she believes that she is on the right track with this injection and does not see the need for any other psychotropic medications to be added to her regimen at this point. She stated that she has baseline auditory hallucinations however they are not command type in nature.  She denied any visual hallucinations.  She denied any paranoid delusions. She denied any suicidal or homicidal ideations.  Patient remained focused on the clinic helping her find a residential group home or facility.  She informed that she lives with her 33 year old mother and stated that it is getting difficult for her mother to take care of her and also drive her to our appointments.  She stated that she was trying to work on it with the staff at [facility] however after care was transferred to this clinic the process discontinued.  She informed that she was told to get social security however her request for Social Security was denied.  She stated that she made an appeal however the law firm that was  helping her with her case closed her case due to lack of sufficient evidence.  Writer offered an appointment for her to see the therapist in a few weeks from now however she stated that she would like to see all the providers of the same day every 3 monthly when she comes to get her injection dose.    Past Psychiatric History: Schizoaffective d/o  Previous Psychotropic Medications: Yes   Substance Abuse History in the last 12 months:  No.  Consequences of Substance Abuse: NA  Past Medical History:  Past Medical History:  Diagnosis Date  . Hypertension   . Schizoaffective disorder ([place]) 2003    Past Surgical History:  Procedure Laterality Date  . FINGER SURGERY  1983    Family Psychiatric History: denied  Family History:  Family History  Problem Relation Age of Onset  . Hyperlipidemia Mother   . Hypertension Mother   . Hyperlipidemia Father   . Hypertension Father   . Heart disease Father   . Breast cancer Sister     Social History:   Social History   Socioeconomic History  . Marital status: Divorced    Spouse name: Not on file  . Number of children: Not on file  . Years of education: Not on file  . Highest education level: Professional school degree (e.g., MD, DDS, DVM, JD)  Occupational History  . Not on file  Tobacco Use  . Smoking status: Never Smoker  . Smokeless tobacco:  Never Used  Vaping Use  . Vaping Use: Never used  Substance and Sexual Activity  . Alcohol use: No  . Drug use: No  . Sexual activity: Not Currently  Other Topics Concern  . Not on file  Social History Narrative  . Not on file   Social Determinants of Health   Financial Resource Strain:   . Difficulty of Paying Living Expenses:   Food Insecurity:   . Worried About Programme researcher, broadcasting/film/video in the Last Year:   . Barista in the Last Year:   Transportation Needs: No Transportation Needs  . Lack of Transportation (Medical): No  . Lack of Transportation (Non-Medical): No   Physical Activity:   . Days of Exercise per Week:   . Minutes of Exercise per Session:   Stress:   . Feeling of Stress :   Social Connections:   . Frequency of Communication with Friends and Family:   . Frequency of Social Gatherings with Friends and Family:   . Attends Religious Services:   . Active Member of Clubs or Organizations:   . Attends Banker Meetings:   Marland Kitchen Marital Status:     Additional Social History: Lives with her 75 y/o mother  Allergies:  No Known Allergies  Metabolic Disorder Labs: Lab Results  Component Value Date   HGBA1C 4.5 04/01/2018   Lab Results  Component Value Date   PROLACTIN 7.4 11/20/2013   Lab Results  Component Value Date   CHOL 269 (H) 03/04/2019   TRIG 107 03/04/2019   HDL 64 03/04/2019   CHOLHDL 4.2 03/04/2019   VLDL 15 10/17/2015   LDLCALC 186 (H) 03/04/2019   LDLCALC 194 (H) 04/01/2018   Lab Results  Component Value Date   TSH 1.750 04/01/2018    Therapeutic Level Labs: No results found for: LITHIUM No results found for: CBMZ No results found for: VALPROATE  Current Medications: Current Outpatient Medications  Medication Sig Dispense Refill  . aspirin 81 MG tablet Take 81 mg by mouth daily.    . hydrochlorothiazide (MICROZIDE) 12.5 MG capsule TAKE 1 CAPSULE(12.5 MG) BY MOUTH DAILY 90 capsule 3  . hydrochlorothiazide (MICROZIDE) 12.5 MG capsule Take 1 capsule (12.5 mg total) by mouth daily. 90 capsule 3  . naproxen (NAPROSYN) 375 MG tablet Take 1 tablet (375 mg total) by mouth 2 (two) times daily. 20 tablet 0  . simvastatin (ZOCOR) 40 MG tablet Take 1 tablet (40 mg total) by mouth at bedtime. (Patient not taking: Reported on 05/21/2019) 90 tablet 0   No current facility-administered medications for this visit.    Musculoskeletal: Strength & Muscle Tone: within normal limits Gait & Station: normal Patient leans: N/A  Psychiatric Specialty Exam: Review of Systems  Blood pressure (!) 140/91, pulse 85,  temperature 98.7 F (37.1 C), height 5' 4.5" (1.638 m), weight 140 lb 8 oz (63.7 kg), SpO2 100 %.Body mass index is 23.74 kg/m.  General Appearance: Fairly Groomed  Eye Contact:  Good  Speech:  Clear and Coherent and Normal Rate  Volume:  Normal  Mood:  Anxious  Affect:  Congruent  Thought Process:  Goal Directed and Descriptions of Associations: Intact  Orientation:  Full (Time, Place, and Person)  Thought Content:  Logical and Hallucinations: Auditory- baseline auditory hallucinations present  Suicidal Thoughts:  No  Homicidal Thoughts:  No  Memory:  Immediate;   Good Recent;   Good  Judgement:  Fair  Insight:  Fair  Psychomotor Activity:  Normal  Concentration:  Concentration: Good and Attention Span: Good  Recall:  Good  Fund of Knowledge:Good  Language: Good  Akathisia:  Negative  Handed:  Right  AIMS (if indicated): not done  Assets:  Communication Skills Desire for Improvement Financial Resources/Insurance Housing Social Support  ADL's:  Intact  Cognition: WNL  Sleep:  Good   Screenings: PHQ2-9     Office Visit from 03/04/2019 in [facility] Office Visit from 08/07/2018 in [facility] Office Visit from 04/01/2018 in [facility] Office Visit from 09/19/2017 in [facility] Office Visit from 05/27/2017 in [facility]  PHQ-2 Total Score 0 0 0 0 0      Assessment and Plan: 49 year old female with history of schizoaffective disorder on long-acting injectable that is administered 3 monthly.  Patient appears to be stable on her current regimen.  She is focused on working on placement in a residential facility as her elderly mother is unable to help her much with her appointments and daily routine.  She was seen by therapist last week. She was offered monthly appointment with the writer and the therapist however she declined both and stated that she would like to see  everyone on the same day when she comes for her next injection in 3 months.  Blood pressure (!) 140/91, pulse 85, temperature 98.7 F (37.1 C), height 5' 4.5" (1.638 m), weight 140 lb 8 oz (63.7 kg), SpO2 100 %.  1. Schizoaffective disorder, depressive type (HCC)  - IM Paliperidone Palmitate ER (INVEGA TRINZA) injection 819 mg- administered today.  Next dose due in 3 months.  Patient would like to follow-up in 3 months despite the writer's suggestion of following up at least once in between her next injection dose appointment with either the writer or therapist.    Zena Amos, MD 6/22/202110:49 AM

## 2019-09-01 NOTE — Progress Notes (Signed)
Patient ID: Sandra Bell, female   DOB: 06-28-1970, 49 y.o.   MRN: [id number] Seen for the first time since transferring here from [facility]. She saw Dr Evelene Croon prior to receiving her injection. She is prescribed [name] 819 mgand will return for next appt in three months to see the Dr, her therapist and also receive her injection. She denies any problems or sx of her illness. She assisted with completing form for her patient assistance to be continued. She is pleasant and appropriate. No complaints voiced. She tolerated the shot well which she takes in her gluteal muscle.

## 2019-09-08 ENCOUNTER — Encounter: Payer: Self-pay | Admitting: Family Medicine

## 2019-09-08 ENCOUNTER — Ambulatory Visit (INDEPENDENT_AMBULATORY_CARE_PROVIDER_SITE_OTHER): Payer: Self-pay | Admitting: Family Medicine

## 2019-09-08 ENCOUNTER — Other Ambulatory Visit: Payer: Self-pay

## 2019-09-08 DIAGNOSIS — I10 Essential (primary) hypertension: Secondary | ICD-10-CM

## 2019-09-08 DIAGNOSIS — F251 Schizoaffective disorder, depressive type: Secondary | ICD-10-CM

## 2019-09-08 DIAGNOSIS — Z139 Encounter for screening, unspecified: Secondary | ICD-10-CM | POA: Insufficient documentation

## 2019-09-08 NOTE — Progress Notes (Signed)
    SUBJECTIVE:   CHIEF COMPLAINT / HPI:   Schizophrenia She has previously been a patient with Sandra Bell.  She notes that Sandra Bell is closed but she has been able to transition her care to the Sandra Bell system.  She continues to to take paliperidone.  HTN Currently taking hydrochlorothiazide.  Cancer screening Since her last visit, our social worker has connected with resources so that she can have cervical cancer and breast cancer screening.  She is not aware if colon cancer screening would be covered well with her current insurance/financial situation.  PERTINENT  PMH / PSH: Schizophrenia, hypertension  OBJECTIVE:   BP 122/86   Pulse 86   Ht 5\' 5"  (1.651 m)   Wt 142 lb (64.4 kg)   LMP  (LMP Unknown)   SpO2 99%   BMI 23.63 kg/m    General: Alert and cooperative and appears to be in no acute distress HEENT: Neck non-tender without lymphadenopathy, masses or thyromegaly Cardio: Normal S1 and S2, no S3 or S4. Rhythm is regular. No murmurs or rubs.  Pulm: Clear to auscultation bilaterally, no crackles, wheezing, or diminished breath sounds. Normal respiratory effort Abdomen: Bowel sounds normal. Abdomen soft and non-tender.  Extremities: No peripheral edema. Warm/ well perfused.  Strong radial pulse. Neuro: Cranial nerves grossly intact   ASSESSMENT/PLAN:   HTN (hypertension) Stable.  Continue HCTZ.  No new blood work needed today.  Schizoaffective disorder, depressive type [facility]) Sandra Bell is being treated with a second-generation antipsychotic.  BMI is under 25 at this time.  Her last lipid level, 6 months ago, led to an ASCVD risk of 3.3% of an event in the next 10 years.  We will not move forward with a statin medication at this time. -Monitor lipid level annually  Encounter for screening involving social determinants of health (SDoH) The current insurance is Katrinka Blazing, Environmental education officer.  This appears to cover cervical cancer screening and breast cancer screening but  little else.  She may benefit from being set up with the orange card.  I will send a message to Higher education careers adviser about the orange card program.    Sandra Shay, MD [facility]

## 2019-09-08 NOTE — Assessment & Plan Note (Signed)
Stable.  Continue HCTZ.  No new blood work needed today.

## 2019-09-08 NOTE — Assessment & Plan Note (Signed)
The current insurance is Environmental education officer, Higher education careers adviser.  This appears to cover cervical cancer screening and breast cancer screening but little else.  She may benefit from being set up with the orange card.  I will send a message to Ree Shay about the orange card program.

## 2019-09-08 NOTE — Patient Instructions (Signed)
It was nice to see you today Ms. Blanchett.  It looks like your blood pressure is well controlled and you have been able to get some of your cancer screening done.  I am going to reach out to our social worker to see what we can do to help you do some colon cancer screening which is recommended based on your age.  Please follow-up again in clinic in 6 months.

## 2019-09-08 NOTE — Assessment & Plan Note (Signed)
Ms. Strough is being treated with a second-generation antipsychotic.  BMI is under 25 at this time.  Her last lipid level, 6 months ago, led to an ASCVD risk of 3.3% of an event in the next 10 years.  We will not move forward with a statin medication at this time. -Monitor lipid level annually

## 2019-11-25 ENCOUNTER — Ambulatory Visit (HOSPITAL_COMMUNITY): Payer: Self-pay | Admitting: Licensed Clinical Social Worker

## 2019-12-01 ENCOUNTER — Ambulatory Visit (HOSPITAL_COMMUNITY): Payer: No Payment, Other | Admitting: *Deleted

## 2019-12-01 ENCOUNTER — Encounter (HOSPITAL_COMMUNITY): Payer: Self-pay | Admitting: *Deleted

## 2019-12-01 ENCOUNTER — Encounter (HOSPITAL_COMMUNITY): Payer: Self-pay | Admitting: Psychiatry

## 2019-12-01 ENCOUNTER — Other Ambulatory Visit: Payer: Self-pay

## 2019-12-01 ENCOUNTER — Telehealth (HOSPITAL_COMMUNITY): Payer: Self-pay | Admitting: Psychiatry

## 2019-12-01 ENCOUNTER — Ambulatory Visit (INDEPENDENT_AMBULATORY_CARE_PROVIDER_SITE_OTHER): Payer: No Payment, Other | Admitting: Licensed Clinical Social Worker

## 2019-12-01 VITALS — BP 150/104 | HR 89 | Temp 98.9°F | Ht 65.0 in | Wt 138.5 lb

## 2019-12-01 DIAGNOSIS — F251 Schizoaffective disorder, depressive type: Secondary | ICD-10-CM | POA: Diagnosis not present

## 2019-12-01 MED ORDER — PALIPERIDONE PALMITATE ER 819 MG/2.625ML IM SUSY
819.0000 mg | PREFILLED_SYRINGE | INTRAMUSCULAR | Status: DC
  Administered 2019-12-01 – 2021-05-30 (×7): 819 mg via INTRAMUSCULAR

## 2019-12-01 NOTE — Progress Notes (Signed)
Patient given Sandra Bell. Injection given in left gluteal. Patient tolerated injection well. Patient to return in 3 months.Patient denied any symptoms of psychosis. Therapist reported she was doing well. Unable to see Dr.Kaur today due to her absence. PO medications to be refilled.

## 2019-12-01 NOTE — Telephone Encounter (Deleted)
Pt was not seen today in the morning due to the provider being out due to an emergency. Prescriptions sent.

## 2019-12-01 NOTE — Progress Notes (Signed)
   THERAPIST PROGRESS NOTE  Session Time: 39  Participation Level: Active  Behavioral Response: Bizarre, Casual and Fairly GroomedAlertEuthymic  Type of Therapy: Individual Therapy  Treatment Goals addressed: Diagnosis: schizoaffective disorder   Interventions: CBT and Supportive  Summary: Sandra Bell is a 49 y.o. female who presents with schizoaffective disorder depression type .   Suicidal/Homicidal: Nowithout intent/plan  Therapist Response:    Subjective/objective: Pt Was alert and oriented x 5. She was dressed casually but bizarre as evidence by self-made bonnet. She engaged well throughout assessment as evidence by note below.   Pt reports an overall decrease in symptoms. She does hear voices daily, have auditory hallucination, ideas of reference, and some minor depression/anxiety in worry and irritability. Pt reports that hear voices daily usually negative thoughts about when she is not doing well. This usually come in the morning and she reports they increase when the road by her house is busier. Sandra Bell states she has coping skills for journaling, yoga, and meditation. She attempted to stay in a routine daily by getting up, eating breakfast, meditating, then showering. Pt primary stress relief is a hot shower in the morning that really seems to help decrease her symptoms stated above. Sandra Bell is journaling almost daily. She has been compliant with her injection and is here for her dose which is administered every three months.   Assessment/plan: Currently pt does meet criteria for schizoaffective disorder depressive type. Plan for LCSW is to journal daily and add in 5 positive words of affirmation about herself 3 x weekly, pt was also provided video for meditation called daily calm. Pt is agreeable to increase yoga from 1 x weekly to 2 x weekly. Sandra Bell will f/u with LCSW for next available appointment.    Weber Cooks, LCSW 12/01/2019

## 2019-12-01 NOTE — Telephone Encounter (Signed)
Error

## 2019-12-16 ENCOUNTER — Telehealth (HOSPITAL_COMMUNITY): Payer: Self-pay | Admitting: Licensed Clinical Social Worker

## 2019-12-16 NOTE — Telephone Encounter (Signed)
Spoke with pt today after a received VM from pt asking about work programs that may help people with mental health. LCSW did call pt back stating that he was unaware of any but would keep an open ear if anything came up. LCSW did recommended possibly a temp agency to see what she likes/does not like and pt was agreeable to that.

## 2020-03-01 ENCOUNTER — Telehealth (HOSPITAL_COMMUNITY): Payer: Self-pay | Admitting: Family Medicine

## 2020-03-01 ENCOUNTER — Ambulatory Visit (INDEPENDENT_AMBULATORY_CARE_PROVIDER_SITE_OTHER): Payer: No Payment, Other | Admitting: Psychiatry

## 2020-03-01 ENCOUNTER — Ambulatory Visit (INDEPENDENT_AMBULATORY_CARE_PROVIDER_SITE_OTHER): Payer: No Payment, Other | Admitting: Licensed Clinical Social Worker

## 2020-03-01 ENCOUNTER — Other Ambulatory Visit: Payer: Self-pay

## 2020-03-01 ENCOUNTER — Encounter (HOSPITAL_COMMUNITY): Payer: Self-pay | Admitting: Psychiatry

## 2020-03-01 ENCOUNTER — Ambulatory Visit (HOSPITAL_COMMUNITY): Payer: No Payment, Other

## 2020-03-01 VITALS — BP 155/112 | HR 95 | Ht 65.0 in | Wt 140.0 lb

## 2020-03-01 DIAGNOSIS — F251 Schizoaffective disorder, depressive type: Secondary | ICD-10-CM | POA: Diagnosis not present

## 2020-03-01 MED ORDER — INVEGA TRINZA 819 MG/2.625ML IM SUSY: 819.0000 mg | PREFILLED_SYRINGE | INTRAMUSCULAR | 3 refills | Status: DC

## 2020-03-01 NOTE — Progress Notes (Signed)
PATIENT ARRIVED FOR 819 MG INVEGA TRINZA INJECTION. PATIENT'S SISTER IS ACCOMPANYING  & EXPRESSING CONCERN  ABOUT SS/MEDICAID DENIAL NEED FOR PAYOR SOURCE  AS PATIENT IS LOOKING INTO A HIGH FUNCTION GROUP HOME SITUATION. PATIENT IS PLEASANT AS ALWAYS. PATIENT TOLERATED INJECTION WELL IN Right - Gluteal. NO SI/HI OR VH/AH.

## 2020-03-01 NOTE — Telephone Encounter (Signed)
Care Management - Outpatient  Writer met with the patient and her sister.  Patient requests assistance with obtaining Medicare insurance to pay for long term assisted living facility.   Patient reports that she is living with her 51 year old mother that may not be able to continue to care for her.  Therefore, she wants to be placed in an assisted living facility.    Writer provided patient the following resources:      [facility] of social services in [place], [place] Address: [address] [place], [place] [address] Hours:  9AM to [place] Phone: [phone]

## 2020-03-01 NOTE — Progress Notes (Signed)
BH OP Progress Note   Patient Identification: Sandra Bell MRN:  [id number] Date of Evaluation:  03/01/2020    Chief Complaint:  " I got denied for my disability again."   Visit Diagnosis:    ICD-10-CM   1. Schizoaffective disorder, depressive type (HCC)  F25.1     History of Present Illness: Patient seen for follow-up.  She presented with her sister today.  Patient stated that she got denied for disability again.  Her sister start speaking up on her behalf and stated that she does not understand why her sister is getting denied repeatedly.  Writer informed the patient and her sister that Clinical research associate never received any paperwork or forms to be filled on her behalf.  Sister stated that makes sense and appreciated that the writer was being honest to them.  Writer informed the patient and sister that as soon as we receive the paperwork we will be happy to help them in any manner we can. Other than this patient has been doing well.  She still lives with her 65 year old mother.  Her sister stated that her mother is getting older and the family wants the patient to be in a safe environment if something is to happen to the mother.  Sister stated that they wanted to live in residential facility where she can have some independence of her own however they need to get some funding and that is why they really want to be able to get her disability taking care of. Patient stated that she is going to be at home for Christmas and then a few days after Christmas they are expecting their older sister to visit them from [place]. Patient is sister denied any other concerns at this time. Patient denied any auditory or visual hallucinations.  She denied any paranoid delusions.  She denied any suicidal or homicidal ideations.   Past Psychiatric History: Schizoaffective d/o  Previous Psychotropic Medications: Yes   Substance Abuse History in the last 12 months:  No.  Consequences of Substance  Abuse: NA  Past Medical History:  Past Medical History:  Diagnosis Date  . Hypertension   . Schizoaffective disorder (HCC) 2003    Past Surgical History:  Procedure Laterality Date  . FINGER SURGERY  1983    Family Psychiatric History: denied  Family History:  Family History  Problem Relation Age of Onset  . Hyperlipidemia Mother   . Hypertension Mother   . Hyperlipidemia Father   . Hypertension Father   . Heart disease Father   . Breast cancer Sister     Social History:   Social History   Socioeconomic History  . Marital status: Divorced    Spouse name: Not on file  . Number of children: Not on file  . Years of education: Not on file  . Highest education level: Professional school degree (e.g., MD, DDS, DVM, JD)  Occupational History  . Not on file  Tobacco Use  . Smoking status: Never Smoker  . Smokeless tobacco: Never Used  Vaping Use  . Vaping Use: Never used  Substance and Sexual Activity  . Alcohol use: No  . Drug use: No  . Sexual activity: Not Currently  Other Topics Concern  . Not on file  Social History Narrative  . Not on file   Social Determinants of Health   Financial Resource Strain: Not on file  Food Insecurity: Not on file  Transportation Needs: No Transportation Needs  . Lack of Transportation (Medical): No  . Lack of  Transportation (Non-Medical): No  Physical Activity: Not on file  Stress: Not on file  Social Connections: Not on file    Additional Social History: Lives with her 65 y/o mother  Allergies:  No Known Allergies  Metabolic Disorder Labs: Lab Results  Component Value Date   HGBA1C 4.5 04/01/2018   Lab Results  Component Value Date   PROLACTIN 7.4 11/20/2013   Lab Results  Component Value Date   CHOL 269 (H) 03/04/2019   TRIG 107 03/04/2019   HDL 64 03/04/2019   CHOLHDL 4.2 03/04/2019   VLDL 15 10/17/2015   LDLCALC 186 (H) 03/04/2019   LDLCALC 194 (H) 04/01/2018   Lab Results  Component Value Date    TSH 1.750 04/01/2018    Therapeutic Level Labs: No results found for: LITHIUM No results found for: CBMZ No results found for: VALPROATE  Current Medications: Current Outpatient Medications  Medication Sig Dispense Refill  . aspirin 81 MG tablet Take 81 mg by mouth daily.    . hydrochlorothiazide (MICROZIDE) 12.5 MG capsule Take 1 capsule (12.5 mg total) by mouth daily. 90 capsule 3  . naproxen (NAPROSYN) 375 MG tablet Take 1 tablet (375 mg total) by mouth 2 (two) times daily. 20 tablet 0  . simvastatin (ZOCOR) 40 MG tablet Take 1 tablet (40 mg total) by mouth at bedtime. 90 tablet 0  . hydrochlorothiazide (MICROZIDE) 12.5 MG capsule TAKE 1 CAPSULE(12.5 MG) BY MOUTH DAILY 90 capsule 3   Current Facility-Administered Medications  Medication Dose Route Frequency Provider Last Rate Last Admin  . Paliperidone Palmitate ER (INVEGA TRINZA) injection 819 mg  819 mg Intramuscular Q90 days Zena Amos, MD   819 mg at 12/01/19 1007    Musculoskeletal: Strength & Muscle Tone: within normal limits Gait & Station: normal Patient leans: N/A  Psychiatric Specialty Exam: Review of Systems   Blood pressure (!) 155/112, pulse 95, height 5\' 5"  (1.651 m), weight 140 lb (63.5 kg), SpO2 100 %.Body mass index is 23.3 kg/m.  General Appearance: Fairly Groomed  Eye Contact:  Good  Speech:  Clear and Coherent and Normal Rate  Volume:  Normal  Mood:  Euthymic  Affect:  Congruent  Thought Process:  Goal Directed and Descriptions of Associations: Intact  Orientation:  Full (Time, Place, and Person)  Thought Content: Logical, denied any hallucinations or delusions  Suicidal Thoughts:  No  Homicidal Thoughts:  No  Memory:  Immediate;   Good Recent;   Good  Judgement:  Fair  Insight:  Fair  Psychomotor Activity:  Normal  Concentration:  Concentration: Good and Attention Span: Good  Recall:  Good  Fund of Knowledge:Good  Language: Good  Akathisia:  Negative  Handed:  Right  AIMS (if  indicated): not done  Assets:  Communication Skills Desire for Improvement Financial Resources/Insurance Housing Social Support  ADL's:  Intact  Cognition: WNL  Sleep:  Good   Screenings: PHQ2-9   Flowsheet Row Office Visit from 09/08/2019 in [facility] Office Visit from 03/04/2019 in [facility] Office Visit from 08/07/2018 in [facility] Office Visit from 04/01/2018 in [facility] Office Visit from 09/19/2017 in [facility]  PHQ-2 Total Score 1 0 0 0 0      Assessment and Plan: Patient seems to be stable on her 3 monthly injection Borgarnes.  She is trying to get disability so that she can get the funding so that the family can  help her transition to a residential facility. Patient and sister denied any other concerns today.  Blood pressure (!) 155/112, pulse 95, height 5\' 5"  (1.651 m), weight 140 lb (63.5 kg), SpO2 100 %.  1. Schizoaffective disorder, depressive type (HCC)  - IM Paliperidone Palmitate ER (INVEGA TRINZA) injection 819 mg- administered today.  Next dose due in 3 months.   Continue individual therapy with Mr. . Continue same medication regimen. Follow up in 3 months.    Madelaine Bhat, MD 12/21/202110:26 AM

## 2020-03-01 NOTE — Progress Notes (Signed)
   THERAPIST PROGRESS NOTE  Session Time: 24  Therapist Response:     Subjective/Objective:  Pt was alert and oriented x 5. She was dressed casually and engaged well throughout therapy session. Pt presented with anxious mood/affect.   Pt presents today with her sister [name] who pt wanted to come back for today's therapy session. Kimimila reports that her sister "[name]" had questions about the long-term needs for pt care. Currently LCSW has [facility] to [facility] which is attempting to help find pt part time employment. Attempts have been made at different places of employment, but no job has been found. LCSW also offered solution focused therapy for both pt and [name] and directed them to [employer] for people with disabilities. Pt and [name] also state they have had trouble getting social security disability. Stating that they have attempted to get a lawyer, but they did not want to take the case. LCSW suggested to look at multiple law groups because different groups have different criteria for what they will take and what they won't as far as cases. Pt and [name] have been referred to [facility] team for further community resources.    Assessment/Plan: Pt endorse symptoms for isolation, auditory hallucinations, fatigue, hopelessness and worthlessness. Currently she meets criteria for schizoaffective disorder depressive type. She is currently taking all medications provided by [facility]. Plan is to f/u with resources above.    Participation Level: Active  Behavioral Response: CasualAlertAnxious  Type of Therapy: Individual Therapy  Treatment Goals addressed: Anxiety  Interventions: Solution Focused  Summary: Sandra Bell is a 49 y.o. female who presents with schizoaffective depressive .   Suicidal/Homicidal: NAwithout intent/plan   Plan: Return again in 6 weeks.      Weber Cooks, LCSW 03/01/2020

## 2020-03-17 ENCOUNTER — Other Ambulatory Visit: Payer: Self-pay

## 2020-03-17 ENCOUNTER — Ambulatory Visit (INDEPENDENT_AMBULATORY_CARE_PROVIDER_SITE_OTHER): Payer: Self-pay | Admitting: Family Medicine

## 2020-03-17 ENCOUNTER — Encounter: Payer: Self-pay | Admitting: Family Medicine

## 2020-03-17 VITALS — BP 124/90 | HR 95 | Ht 65.0 in | Wt 132.1 lb

## 2020-03-17 DIAGNOSIS — E782 Mixed hyperlipidemia: Secondary | ICD-10-CM

## 2020-03-17 DIAGNOSIS — E785 Hyperlipidemia, unspecified: Secondary | ICD-10-CM

## 2020-03-17 DIAGNOSIS — I10 Essential (primary) hypertension: Secondary | ICD-10-CM

## 2020-03-17 DIAGNOSIS — Z131 Encounter for screening for diabetes mellitus: Secondary | ICD-10-CM

## 2020-03-17 DIAGNOSIS — N63 Unspecified lump in unspecified breast: Secondary | ICD-10-CM | POA: Insufficient documentation

## 2020-03-17 DIAGNOSIS — F251 Schizoaffective disorder, depressive type: Secondary | ICD-10-CM

## 2020-03-17 MED ORDER — HYDROCHLOROTHIAZIDE 12.5 MG PO CAPS: 12.5000 mg | ORAL_CAPSULE | Freq: Every day | ORAL | 3 refills | Status: DC

## 2020-03-17 MED ORDER — SIMVASTATIN 40 MG PO TABS: 40.0000 mg | ORAL_TABLET | Freq: Every day | ORAL | 3 refills | Status: DC

## 2020-03-17 NOTE — Assessment & Plan Note (Signed)
-  Continue medication per psychiatry -Follow-up A1c and lipid panel every 6 months due to her current antipsychotic regimen

## 2020-03-17 NOTE — Addendum Note (Signed)
Addended by: Nada Libman on: 03/17/2020 09:49 AM   Modules accepted: Orders

## 2020-03-17 NOTE — Patient Instructions (Signed)
It was great to see you today.  Here is a quick review of the things we talked about:  High blood pressure: I would certainly recommend you continue taking your blood pressure medicine.  If you do decide to start taking the medicine, it is waiting for you at the pharmacy.  It should only cost about $4 per month.  Cholesterol: I also recommend that you take your cholesterol medicine daily.  This helps prevent issues like heart attack and stroke in the future.  This should also cost about $4 per month.  Breast biopsy: I think this is probably the most important thing for you to consider.  I think that this is important and you should have it done.  Please let me know if you are interested in having it done.  Colonoscopy: I do think that you should have a colonoscopy done for appropriate age-related cancer screening.  Please let me know if you are interested in this.   If all of your labs are normal, I will send you a message over my chart or send you a letter.  If there is anything to discuss, I will give you a phone call.

## 2020-03-17 NOTE — Assessment & Plan Note (Addendum)
She was encouraged to restart her blood pressure medication in addition to her dietary changes for the best impact to her blood pressure.  She is reluctant to restart her blood pressure medicine.  She was informed that I have sent a refill to her pharmacy so that she can restart this medicine if she chooses. -Refill sent to pharmacy -Follow-up BMP

## 2020-03-17 NOTE — Assessment & Plan Note (Addendum)
The findings from her last ultrasound are as follows:  "1. 1.9 cm mass in the 1 o'clock position of the left breast, 1 cm from the nipple with ultrasound features suggesting a fibroadenoma or, possibly, a papilloma. This is symptomatic and malignancy cannot be excluded. 2. 2 abnormal left axillary lymph nodes. The 7 mm mid left axillary lymph node does not have the typical appearance of a reactive node and is suspicious for the possibility of a metastatic node. The more inferior node could represent a reactive node or metastatic node."  She was urged to consider a breast biopsy and I attempted to alleviate any sources of anxiety.  She persisted in her desire to avoid a biopsy at this time.  I will call her in a day or 2 when we have her lab results back and try to talk with her and her mother about having the breast biopsy done.

## 2020-03-17 NOTE — Progress Notes (Addendum)
SUBJECTIVE:   CHIEF COMPLAINT / HPI:   Hypertension Sandra Bell reports that she has not been taking her hydrochlorothiazide for roughly the past 2 months.  She ran out of medicine and ask for refill from her pharmacy but does not remember ever hearing back.  In addition to running out of medicine, she has been making an effort to practice good nutrition habits with a vegetarian diet and low salt to help reduce her blood pressure.  She reports that she feels better when she is not taking this medicine.  Hyperlipidemia She reports that she is not taking simvastatin.  She is making an effort to practice a healthy diet and use appropriate lifestyle interventions to lower her cholesterol.  She is not interested in restarting this medicine at this time.  Breast lump She was seen about 8 months ago for breast lump and had a partial work-up performed.  Ultrasound of her breast demonstrated a nodule for which biopsy was recommended.  She grew anxious about this biopsy and decided to not have it done.  We discussed her hesitation and she noted that it is primarily the needles that made her anxious about having this procedure done.  Health maintenance Due for colonoscopy/colon cancer screening.  Schizoaffective disorder She receives paliperidone injections every 3 months.  PERTINENT  PMH / PSH: Schizoaffective disorder, hypertension, hyperlipidemia  OBJECTIVE:   BP 124/90   Pulse 95   Ht 5\' 5"  (1.651 m)   Wt 132 lb 2 oz (59.9 kg)   LMP  (LMP Unknown)   SpO2 99%   BMI 21.99 kg/m    General: Alert and cooperative and appears to be in no acute distress Cardio: Normal S1 and S2, no S3 or S4. Rhythm is regular. No murmurs or rubs.   Pulm: Clear to auscultation bilaterally, no crackles, wheezing, or diminished breath sounds. Normal respiratory effort Abdomen: Bowel sounds normal. Abdomen soft and non-tender.  Extremities: No peripheral edema. Warm/ well perfused.  Strong radial  pulses. Neuro: Cranial nerves grossly intact  ASSESSMENT/PLAN:   HTN (hypertension) She was encouraged to restart her blood pressure medication in addition to her dietary changes for the best impact to her blood pressure.  She is reluctant to restart her blood pressure medicine.  She was informed that I have sent a refill to her pharmacy so that she can restart this medicine if she chooses. -Refill sent to pharmacy -Follow-up BMP  HLD (hyperlipidemia) She was not interested in restarting her simvastatin today.  She was encouraged to take this medication daily to help prevent future heart attacks or strokes.  She was told that a refill will be sent to her pharmacy in case she did decide to restart this medication. -Refill sent to pharmacy -Follow-up lipid panel  Schizoaffective disorder, depressive type (HCC) -Continue medication per psychiatry -Follow-up A1c and lipid panel every 6 months due to her current antipsychotic regimen  Breast lump The findings from her last ultrasound are as follows:  "1. 1.9 cm mass in the 1 o'clock position of the left breast, 1 cm from the nipple with ultrasound features suggesting a fibroadenoma or, possibly, a papilloma. This is symptomatic and malignancy cannot be excluded. 2. 2 abnormal left axillary lymph nodes. The 7 mm mid left axillary lymph node does not have the typical appearance of a reactive node and is suspicious for the possibility of a metastatic node. The more inferior node could represent a reactive node or metastatic node."  She was urged to  consider a breast biopsy and I attempted to alleviate any sources of anxiety.  She persisted in her desire to avoid a biopsy at this time.  I will call her in a day or 2 when we have her lab results back and try to talk with her and her mother about having the breast biopsy done.     Mirian Mo, MD [facility]

## 2020-03-17 NOTE — Assessment & Plan Note (Addendum)
She was not interested in restarting her simvastatin today.  She was encouraged to take this medication daily to help prevent future heart attacks or strokes.  She was told that a refill will be sent to her pharmacy in case she did decide to restart this medication. -Refill sent to pharmacy -Follow-up lipid panel

## 2020-03-18 LAB — BASIC METABOLIC PANEL
BUN/Creatinine Ratio: 5 — ABNORMAL LOW (ref 9–23)
BUN: 5 mg/dL — ABNORMAL LOW (ref 6–24)
CO2: 23 mmol/L (ref 20–29)
Calcium: 9.9 mg/dL (ref 8.7–10.2)
Chloride: 105 mmol/L (ref 96–106)
Creatinine, Ser: 1.02 mg/dL — ABNORMAL HIGH (ref 0.57–1.00)
GFR calc Af Amer: 74 mL/min/{1.73_m2} (ref >59)
GFR calc non Af Amer: 64 mL/min/{1.73_m2} (ref >59)
Glucose: 87 mg/dL (ref 65–99)
Potassium: 4.3 mmol/L (ref 3.5–5.2)
Sodium: 143 mmol/L (ref 134–144)

## 2020-03-18 LAB — LIPID PANEL
Chol/HDL Ratio: 5.5 ratio — ABNORMAL HIGH (ref 0.0–4.4)
Cholesterol, Total: 235 mg/dL — ABNORMAL HIGH (ref 100–199)
HDL: 43 mg/dL (ref >39)
LDL Chol Calc (NIH): 172 mg/dL — ABNORMAL HIGH (ref 0–99)
Triglycerides: 112 mg/dL (ref 0–149)
VLDL Cholesterol Cal: 20 mg/dL (ref 5–40)

## 2020-03-18 LAB — HEMOGLOBIN A1C
Est. average glucose Bld gHb Est-mCnc: 85 mg/dL
Hgb A1c MFr Bld: 4.6 % — ABNORMAL LOW (ref 4.8–5.6)

## 2020-03-30 ENCOUNTER — Telehealth (HOSPITAL_COMMUNITY): Payer: Self-pay | Admitting: *Deleted

## 2020-03-30 NOTE — Telephone Encounter (Signed)
Sandra Bell, one of patients sisters called to update me on process of getting Sandra Bell an income and to alert me to possible request coming from social security for her to get SSDI which they have applied to SSI.

## 2020-05-31 ENCOUNTER — Ambulatory Visit (HOSPITAL_COMMUNITY): Payer: No Payment, Other | Admitting: *Deleted

## 2020-05-31 ENCOUNTER — Ambulatory Visit (INDEPENDENT_AMBULATORY_CARE_PROVIDER_SITE_OTHER): Payer: No Payment, Other | Admitting: Licensed Clinical Social Worker

## 2020-05-31 ENCOUNTER — Ambulatory Visit (HOSPITAL_COMMUNITY): Payer: No Payment, Other

## 2020-05-31 ENCOUNTER — Other Ambulatory Visit: Payer: Self-pay

## 2020-05-31 ENCOUNTER — Encounter (HOSPITAL_COMMUNITY): Payer: Self-pay

## 2020-05-31 ENCOUNTER — Ambulatory Visit (HOSPITAL_COMMUNITY): Payer: No Payment, Other | Admitting: Psychiatry

## 2020-05-31 VITALS — BP 154/102 | HR 99 | Ht 65.0 in | Wt 132.0 lb

## 2020-05-31 DIAGNOSIS — F251 Schizoaffective disorder, depressive type: Secondary | ICD-10-CM

## 2020-05-31 NOTE — Progress Notes (Signed)
   THERAPIST PROGRESS NOTE  Session Time: 30   Therapist Response:     Subjective/Objective:   Pt was alert and oriented x 5. She was dressed casually and engaged well in therapy. Pt presented today with depressed and anxious mood/affect. Sandra Bell was cooperative and maintained good eye contact.   Pt reports that her primary stressor is financials. Her sister, mother, and self are applying for SSDI for pt. They have started the process and have a lawyer to help with the case. They understand the process take 9 to 24 months. Objective for pt is to 1. Get approved for SSDI then apply for [employer] housing for their group homes.    Assessment/Plan: Pt endorses AH, paranoia, and insomnia. She has been taking her medications which does keep the symptoms at a minimum. She is does meet criteria for schizoaffective disorder depressive type. Plan will be to meet with [facility] house for peer support 1 x weekly. She will also attempt to volunteer 1 x weekly and continue to use mediation and journal 3 x weekly.   Participation Level: Active  Behavioral Response: CasualAlertAnxious and Depressed  Type of Therapy: Individual Therapy  Treatment Goals addressed: Diagnosis: depression   Interventions: CBT and Supportive  Summary: Sandra Bell is a 50 y.o. female who presents with schizoaffective depressive type.   Suicidal/Homicidal: NAwithout intent/plan    Plan: Return again in 8 weeks.    Weber Cooks, LCSW 05/31/2020

## 2020-05-31 NOTE — Progress Notes (Signed)
In as scheduled for her q 3 month injection of Invega Trinza 819 mg. Today she got her injection in her L gluteal muscle. She looks well and states she is doing. Updated Clinical research associate on her disability application, she has her sister and an attorney helping her with this process. She continues to live with her mom, disability is important to get for her to have a change in her living situation, ie group home. She has an appt via phone next week with Dr Evelene Croon. She was encouraged to call as needed. To return in 3 months for her next injection.

## 2020-06-02 ENCOUNTER — Encounter (HOSPITAL_COMMUNITY): Payer: Self-pay | Admitting: Psychiatry

## 2020-06-02 ENCOUNTER — Telehealth (INDEPENDENT_AMBULATORY_CARE_PROVIDER_SITE_OTHER): Payer: No Payment, Other | Admitting: Psychiatry

## 2020-06-02 ENCOUNTER — Other Ambulatory Visit: Payer: Self-pay

## 2020-06-02 DIAGNOSIS — F251 Schizoaffective disorder, depressive type: Secondary | ICD-10-CM

## 2020-06-02 NOTE — Progress Notes (Signed)
BH OP Progress Note   Virtual Visit via Telephone Note  I connected with Sandra Bell on 06/02/20 at 10:40 AM EDT by telephone and verified that I am speaking with the correct person using two identifiers.  Location: Patient: home Provider: Clinic   I discussed the limitations, risks, security and privacy concerns of performing an evaluation and management service by telephone and the availability of in person appointments. I also discussed with the patient that there may be a patient responsible charge related to this service. The patient expressed understanding and agreed to proceed.   I provided 16 minutes of non-face-to-face time during this encounter.   Patient Identification: Sandra Bell MRN:  [id number] Date of Evaluation:  06/02/2020    Chief Complaint:  " I am doing better."   Visit Diagnosis:    ICD-10-CM   1. Schizoaffective disorder, depressive type (HCC)  F25.1     History of Present Illness: Patient was contacted for follow-up.  She informed that she is doing better than before.  She informed that she has been applying for Social Security to see if she can qualify for Medicaid.  The application is pending and she is supposed to hear back in the next few weeks.  She informed that the family has also had a lawyer that is working with her and engage her Social Security request gets denied again but will help her with that. She stated that she is interested in the [employer] group home as she prefers a group home over the assisted living apartment as that will give her more structure and routine. She also informed that [facility] and has a peer support specialist that meets with her once a week and she has really been enjoying this.  She informed that it is a gentleman who has been helping her and to talk about daily activities and lifestyle.  He is also trying to get her connected to a voluntary job so that she can have more  meaningful lifestyle and schedule. Patient received her IM [name] injection on March 22nd this week and reported that her symptoms of psychosis are under control.  She denied any hallucinations or delusions.  She denied any symptoms suggestive of depression or mania.  She denied any other issues or concerns at this time.  Past Psychiatric History: Schizoaffective d/o  Previous Psychotropic Medications: Yes   Substance Abuse History in the last 12 months:  No.  Consequences of Substance Abuse: NA  Past Medical History:  Past Medical History:  Diagnosis Date  . Hypertension   . Schizoaffective disorder (HCC) 2003    Past Surgical History:  Procedure Laterality Date  . FINGER SURGERY  1983    Family Psychiatric History: denied  Family History:  Family History  Problem Relation Age of Onset  . Hyperlipidemia Mother   . Hypertension Mother   . Hyperlipidemia Father   . Hypertension Father   . Heart disease Father   . Breast cancer Sister     Social History:   Social History   Socioeconomic History  . Marital status: Divorced    Spouse name: Not on file  . Number of children: Not on file  . Years of education: Not on file  . Highest education level: Professional school degree (e.g., MD, DDS, DVM, JD)  Occupational History  . Not on file  Tobacco Use  . Smoking status: Never Smoker  . Smokeless tobacco: Never Used  Vaping Use  .  Vaping Use: Never used  Substance and Sexual Activity  . Alcohol use: No  . Drug use: No  . Sexual activity: Not Currently  Other Topics Concern  . Not on file  Social History Narrative  . Not on file   Social Determinants of Health   Financial Resource Strain: Not on file  Food Insecurity: Not on file  Transportation Needs: Not on file  Physical Activity: Not on file  Stress: Not on file  Social Connections: Not on file    Additional Social History: Lives with her 52 y/o mother  Allergies:  No Known  Allergies  Metabolic Disorder Labs: Lab Results  Component Value Date   HGBA1C 4.6 (L) 03/17/2020   Lab Results  Component Value Date   PROLACTIN 7.4 11/20/2013   Lab Results  Component Value Date   CHOL 235 (H) 03/17/2020   TRIG 112 03/17/2020   HDL 43 03/17/2020   CHOLHDL 5.5 (H) 03/17/2020   VLDL 15 10/17/2015   LDLCALC 172 (H) 03/17/2020   LDLCALC 186 (H) 03/04/2019   Lab Results  Component Value Date   TSH 1.750 04/01/2018    Therapeutic Level Labs: No results found for: LITHIUM No results found for: CBMZ No results found for: VALPROATE  Current Medications: Current Outpatient Medications  Medication Sig Dispense Refill  . aspirin 81 MG tablet Take 81 mg by mouth daily.    . hydrochlorothiazide (MICROZIDE) 12.5 MG capsule Take 1 capsule (12.5 mg total) by mouth daily. 90 capsule 3  . simvastatin (ZOCOR) 40 MG tablet Take 1 tablet (40 mg total) by mouth at bedtime. 90 tablet 3   Current Facility-Administered Medications  Medication Dose Route Frequency Provider Last Rate Last Admin  . Paliperidone Palmitate ER (INVEGA TRINZA) injection 819 mg  819 mg Intramuscular Q90 days Zena Amos, MD   819 mg at 05/31/20 0932     Psychiatric Specialty Exam: Review of Systems  There were no vitals taken for this visit.There is no height or weight on file to calculate BMI.  General Appearance: Unable to assess due to phone visit  Eye Contact:  Unable to assess due to phone visit  Speech:  Clear and Coherent and Normal Rate  Volume:  Normal  Mood:  Euthymic  Affect:  Congruent  Thought Process:  Goal Directed and Descriptions of Associations: Intact  Orientation:  Full (Time, Place, and Person)  Thought Content: Logical, denied any hallucinations or delusions  Suicidal Thoughts:  No  Homicidal Thoughts:  No  Memory:  Immediate;   Good Recent;   Good  Judgement:  Fair  Insight:  Fair  Psychomotor Activity:  Normal  Concentration:  Concentration: Good and Attention  Span: Good  Recall:  Good  Fund of Knowledge:Good  Language: Good  Akathisia:  Negative  Handed:  Right  AIMS (if indicated): not done  Assets:  Communication Skills Desire for Improvement Financial Resources/Insurance Housing Social Support  ADL's:  Intact  Cognition: WNL  Sleep:  Good   Screenings: PHQ2-9   Flowsheet Row Office Visit from 03/17/2020 in [facility] Office Visit from 09/08/2019 in [facility] Office Visit from 03/04/2019 in [facility] Office Visit from 08/07/2018 in [facility] Office Visit from 04/01/2018 in [facility]  PHQ-2 Total Score 0 1 0 0 0  PHQ-9 Total Score 0 - - - -      Assessment and Plan: Patient appears to be  stable on her current regimen.  We will continue the same regimen for now.  1. Schizoaffective disorder, depressive type (HCC)  - IM Paliperidone Palmitate ER (INVEGA TRINZA) injection 819 mg- administered on March 22nd.  Next dose due in 3 months.   Continue individual therapy with Mr. Madelaine Bhat. Continue same medication regimen. Follow up in 3 months.    Zena Amos, MD 3/24/202210:40 AM

## 2020-08-30 ENCOUNTER — Encounter (HOSPITAL_COMMUNITY): Payer: Self-pay

## 2020-08-30 ENCOUNTER — Ambulatory Visit (INDEPENDENT_AMBULATORY_CARE_PROVIDER_SITE_OTHER): Payer: No Payment, Other | Admitting: Psychiatry

## 2020-08-30 ENCOUNTER — Ambulatory Visit (INDEPENDENT_AMBULATORY_CARE_PROVIDER_SITE_OTHER): Payer: No Payment, Other | Admitting: Licensed Clinical Social Worker

## 2020-08-30 ENCOUNTER — Other Ambulatory Visit: Payer: Self-pay

## 2020-08-30 ENCOUNTER — Ambulatory Visit (HOSPITAL_COMMUNITY): Payer: No Payment, Other | Admitting: *Deleted

## 2020-08-30 VITALS — BP 167/84 | HR 86 | Ht 65.0 in | Wt 156.0 lb

## 2020-08-30 DIAGNOSIS — F251 Schizoaffective disorder, depressive type: Secondary | ICD-10-CM

## 2020-08-30 NOTE — Progress Notes (Signed)
   THERAPIST PROGRESS NOTE  Session Time: 31   Participation Level: Active  Behavioral Response: CasualAlertAnxious  Type of Therapy: Individual Therapy  Treatment Goals addressed: Diagnosis: schizoaffective disorder depressive  Interventions: Supportive and Reframing  Summary: Nature Kueker is a 50 y.o. female who presents with schizoaffective disorder depressive type.   Suicidal/Homicidal: NAwithout intent/plan  Therapist Response:    Subjective/Objective: Pt was alert and oriented x 5. She was dressed casually and engaged well in therapy session. Pt presented with anxious mood/affect. She was cooperative and maintained good eye contact.   Pt started out session with stating "everything has been going good". Currently pt is only being seen every three months for individual therapy. LCSW used solution focused therapy in today's session. Pt was provided resources to [facility] for Group therapy. Contact information and description of support groups given. Pt agreeable to contact and setup in take with [facility].    Assessment: Pt endorses symptoms for AVH weekly, isolation, and fatigue. She is taking her medications as prescribed. Next steps for pt are to start support groups which pt was agreeable too.  Plan: Return again in 4  weeks.     Weber Cooks, LCSW 08/30/2020

## 2020-08-30 NOTE — Progress Notes (Signed)
Patient ID: Sandra Bell, female   DOB: Oct 24, 1970, 50 y.o.   MRN: [id number] In as scheduled to see Dr Evelene Croon, her therapist and to get her every 3 month injection of Invega Trinza 819 mg. She has a nice appearance and is her usual pleasant self. She offers no complaints. Only summer plan she told Clinical research associate about was family visiting from [place] in Aug she is looking forward to. She is to return in 3 months for her next injection.

## 2020-08-30 NOTE — Progress Notes (Signed)
BH OP Progress Note     Patient Identification: Sandra Bell MRN:  [id number] Date of Evaluation:  08/30/2020    Chief Complaint:  " I am doing fine."   Visit Diagnosis:    ICD-10-CM   1. Schizoaffective disorder, depressive type (HCC)  F25.1       History of Present Illness: Patient stated that she is doing well.  She stated that her mood has been stable.  She is sleeping well at night.  She stated that she usually goes to bed around 10 PM and then wakes up at 6 AM.  She stated that on some occasions she does not sleep until 4 AM but then sleeps through the morning and then wakes up around noon.  She has been able to get consistent 8 hours of sleep regularly. She denies any issues with her mood.  She denied any hallucinations or delusions. Regarding her disability, she stated that she was informed that she should be hearing back from them by end of this month and she is going to wait till this month is over. She is no longer working with the peers support specialist from [facility].  She stays at home with her mother and they both stay busy and taking care of the house together. Her sisters are doing well and the older sister from [place] is planning to visit them in August and they are looking forward to that visit.  She denies any other concerns.  She is aware that the writer is leaving the office and she wished the Clinical research associate the best.   Past Psychiatric History: Schizoaffective d/o  Previous Psychotropic Medications: Yes   Substance Abuse History in the last 12 months:  No.  Consequences of Substance Abuse: NA  Past Medical History:  Past Medical History:  Diagnosis Date   Hypertension    Schizoaffective disorder (HCC) 2003    Past Surgical History:  Procedure Laterality Date   FINGER SURGERY  1983    Family Psychiatric History: denied  Family History:  Family History  Problem Relation Age of Onset   Hyperlipidemia Mother    Hypertension Mother     Hyperlipidemia Father    Hypertension Father    Heart disease Father    Breast cancer Sister     Social History:   Social History   Socioeconomic History   Marital status: Divorced    Spouse name: Not on file   Number of children: Not on file   Years of education: Not on file   Highest education level: Professional school degree (e.g., MD, DDS, DVM, JD)  Occupational History   Not on file  Tobacco Use   Smoking status: Never   Smokeless tobacco: Never  Vaping Use   Vaping Use: Never used  Substance and Sexual Activity   Alcohol use: No   Drug use: No   Sexual activity: Not Currently  Other Topics Concern   Not on file  Social History Narrative   Not on file   Social Determinants of Health   Financial Resource Strain: Not on file  Food Insecurity: Not on file  Transportation Needs: Not on file  Physical Activity: Not on file  Stress: Not on file  Social Connections: Not on file    Additional Social History: Lives with her 40 y/o mother  Allergies:  No Known Allergies  Metabolic Disorder Labs: Lab Results  Component Value Date   HGBA1C 4.6 (L) 03/17/2020   Lab Results  Component Value Date  PROLACTIN 7.4 11/20/2013   Lab Results  Component Value Date   CHOL 235 (H) 03/17/2020   TRIG 112 03/17/2020   HDL 43 03/17/2020   CHOLHDL 5.5 (H) 03/17/2020   VLDL 15 10/17/2015   LDLCALC 172 (H) 03/17/2020   LDLCALC 186 (H) 03/04/2019   Lab Results  Component Value Date   TSH 1.750 04/01/2018    Therapeutic Level Labs: No results found for: LITHIUM No results found for: CBMZ No results found for: VALPROATE  Current Medications: Current Outpatient Medications  Medication Sig Dispense Refill   aspirin 81 MG tablet Take 81 mg by mouth daily.     hydrochlorothiazide (MICROZIDE) 12.5 MG capsule Take 1 capsule (12.5 mg total) by mouth daily. 90 capsule 3   simvastatin (ZOCOR) 40 MG tablet Take 1 tablet (40 mg total) by mouth at bedtime. 90 tablet 3    Current Facility-Administered Medications  Medication Dose Route Frequency Provider Last Rate Last Admin   Paliperidone Palmitate ER (INVEGA TRINZA) injection 819 mg  819 mg Intramuscular Q90 days Zena Amos, MD   819 mg at 05/31/20 0932     Psychiatric Specialty Exam: Review of Systems  There were no vitals taken for this visit.There is no height or weight on file to calculate BMI.  General Appearance: Fairly Groomed  Eye Contact:  Good  Speech:  Clear and Coherent and Normal Rate  Volume:  Normal  Mood:  Euthymic  Affect:  Congruent  Thought Process:  Goal Directed and Descriptions of Associations: Intact  Orientation:  Full (Time, Place, and Person)  Thought Content: Logical, denied any hallucinations or delusions  Suicidal Thoughts:  No  Homicidal Thoughts:  No  Memory:  Immediate;   Good Recent;   Good  Judgement:  Fair  Insight:  Fair  Psychomotor Activity:  Normal  Concentration:  Concentration: Good and Attention Span: Good  Recall:  Good  Fund of Knowledge:Good  Language: Good  Akathisia:  Negative  Handed:  Right  AIMS (if indicated): not done  Assets:  Communication Skills Desire for Improvement Financial Resources/Insurance Housing Social Support  ADL's:  Intact  Cognition: WNL  Sleep:  Good   Screenings: PHQ2-9    Flowsheet Row Office Visit from 03/17/2020 in [facility] Office Visit from 09/08/2019 in [facility] Office Visit from 03/04/2019 in [facility] Office Visit from 08/07/2018 in [facility] Office Visit from 04/01/2018 in [facility]  PHQ-2 Total Score 0 1 0 0 0  PHQ-9 Total Score 0 -- -- -- --       Assessment and Plan: Patient remains stable on her current regimen.  1. Schizoaffective disorder, depressive type (HCC)  - IM Paliperidone Palmitate ER (INVEGA TRINZA) injection 819 mg, received the dose today.  Continue individual  therapy with Mr. Madelaine Bhat. Continue same medication regimen. Follow up in 3 months.  Will be seeing Dr. [name] at the time of her next visit.  Zena Amos, MD 6/21/202210:11 AM

## 2020-11-30 ENCOUNTER — Other Ambulatory Visit: Payer: Self-pay

## 2020-11-30 ENCOUNTER — Ambulatory Visit (INDEPENDENT_AMBULATORY_CARE_PROVIDER_SITE_OTHER): Payer: No Payment, Other | Admitting: Psychiatry

## 2020-11-30 ENCOUNTER — Ambulatory Visit (HOSPITAL_COMMUNITY): Payer: No Payment, Other | Admitting: *Deleted

## 2020-11-30 ENCOUNTER — Encounter (HOSPITAL_COMMUNITY): Payer: Self-pay | Admitting: Psychiatry

## 2020-11-30 ENCOUNTER — Encounter (HOSPITAL_COMMUNITY): Payer: Self-pay

## 2020-11-30 VITALS — BP 142/98 | HR 84 | Ht 65.0 in | Wt 135.0 lb

## 2020-11-30 DIAGNOSIS — F251 Schizoaffective disorder, depressive type: Secondary | ICD-10-CM

## 2020-11-30 MED ORDER — INVEGA TRINZA 819 MG/2.63ML IM SUSY: 819.0000 mg | PREFILLED_SYRINGE | INTRAMUSCULAR | 11 refills | Status: DC

## 2020-11-30 NOTE — Progress Notes (Signed)
In as scheduled for her every three month injection of Invega Trinza 819 mg in her L GLUTEAL muscle without difficulty. She looks good, she is pleasant and upbeat. States her mom brought her and is waiting for her in the car. She denies any psychotic sx and is at her baseline. She is also seeing the provider today for her three month assessment. She is to return in three months for her next injection. She continues to wait on a decision on her disability.

## 2020-11-30 NOTE — Progress Notes (Signed)
BH MD/PA/NP OP Progress Note  11/30/2020 9:28 AM Sandra Bell  MRN:  [id number]  Chief Complaint: " I really like the Invega"  HPI: Patient is a 50 yo female seen today for follow-up psychiatric evaluation. She is a former patient of Dr. Quintella Baton who is being transferred to writer for med management. She has a psychiatric history of schizoaffective disorder. Currently managed on Invaga trinza q 3 months IM. She notes her medication are effective in managing her psychiatric conditions.  Today the patient is pleasant, cooperative, and engaged in conversation, and maintained eye contact. She informed the writer that since last visit, she has been doing well. She is still hearing negative voices daily that tell her she is not doing well which irritates her. These occur between 8am and 5pm. She states that reading and meditating help her cope with the voices. She notes that she has minimal anxiety or depression currently. Today provider conducted a GAD-7 and patient scored a 2.  She notes that at times she worries about receiving her disability. Today provider conducted a PHQ-9 and scored a 1. Patient denies SI/HI/AVH, mania or paranoia. Patient reports they have been maintaining a normal appetite and weight. She is sleeping 4-8 hours and states she is well rested.   Patient reports that she lives at home with her mother. She is no longer seeing a peer support specialist and was advised to join a support group [facility]), chose the [employer] group, and has been going once a month she notes that she enjoys going to these groups and anticipates each meeting. She notes that she has recently applied for disability in March and has yet to hear back.  No medication changes made today. She is agreeable to continue medications as prescribed.  No other concerns noted at this time. Visit Diagnosis:    ICD-10-CM   1. Schizoaffective disorder, depressive type (HCC)  F25.1 paliperidone  Palmitate ER (INVEGA TRINZA) 819 MG/2.63ML SUSY injection      Past Psychiatric History: Schizoaffective disorder, depressive type  Past Medical History:  Past Medical History:  Diagnosis Date   Hypertension    Schizoaffective disorder (HCC) 2003    Past Surgical History:  Procedure Laterality Date   FINGER SURGERY  1983    Family Psychiatric History: denied  Family History:  Family History  Problem Relation Age of Onset   Hyperlipidemia Mother    Hypertension Mother    Hyperlipidemia Father    Hypertension Father    Heart disease Father    Breast cancer Sister     Social History:  Social History   Socioeconomic History   Marital status: Divorced    Spouse name: Not on file   Number of children: Not on file   Years of education: Not on file   Highest education level: Professional school degree (e.g., MD, DDS, DVM, JD)  Occupational History   Not on file  Tobacco Use   Smoking status: Never   Smokeless tobacco: Never  Vaping Use   Vaping Use: Never used  Substance and Sexual Activity   Alcohol use: No   Drug use: No   Sexual activity: Not Currently  Other Topics Concern   Not on file  Social History Narrative   Not on file   Social Determinants of Health   Financial Resource Strain: Not on file  Food Insecurity: Not on file  Transportation Needs: Not on file  Physical Activity: Not on file  Stress: Not on file  Social Connections: Not on file    Allergies: No Known Allergies  Metabolic Disorder Labs: Lab Results  Component Value Date   HGBA1C 4.6 (L) 03/17/2020   Lab Results  Component Value Date   PROLACTIN 7.4 11/20/2013   Lab Results  Component Value Date   CHOL 235 (H) 03/17/2020   TRIG 112 03/17/2020   HDL 43 03/17/2020   CHOLHDL 5.5 (H) 03/17/2020   VLDL 15 10/17/2015   LDLCALC 172 (H) 03/17/2020   LDLCALC 186 (H) 03/04/2019   Lab Results  Component Value Date   TSH 1.750 04/01/2018   TSH 1.312 10/26/2014    Therapeutic  Level Labs: No results found for: LITHIUM No results found for: VALPROATE No components found for:  CBMZ  Current Medications: Current Outpatient Medications  Medication Sig Dispense Refill   paliperidone Palmitate ER (INVEGA TRINZA) 819 MG/2.63ML SUSY injection Inject 2.63 mLs (819 mg total) into the muscle every 3 (three) months. 819 mL 11   aspirin 81 MG tablet Take 81 mg by mouth daily.     hydrochlorothiazide (MICROZIDE) 12.5 MG capsule Take 1 capsule (12.5 mg total) by mouth daily. 90 capsule 3   simvastatin (ZOCOR) 40 MG tablet Take 1 tablet (40 mg total) by mouth at bedtime. 90 tablet 3   Current Facility-Administered Medications  Medication Dose Route Frequency Provider Last Rate Last Admin   Paliperidone Palmitate ER (INVEGA TRINZA) injection 819 mg  819 mg Intramuscular Q90 days Zena Amos, MD   819 mg at 11/30/20 0911     Musculoskeletal: Strength & Muscle Tone: within normal limits Gait & Station: normal Patient leans: N/A  Psychiatric Specialty Exam: Review of Systems  There were no vitals taken for this visit.There is no height or weight on file to calculate BMI.  General Appearance: Well Groomed  Eye Contact:  Good  Speech:  Clear and Coherent and Normal Rate  Volume:  Normal  Mood:  Euthymic  Affect:  Appropriate and Congruent  Thought Process:  Coherent, Goal Directed, and Linear  Orientation:  Full (Time, Place, and Person)  Thought Content: WDL and Logical   Suicidal Thoughts:  No  Homicidal Thoughts:  No  Memory:  Immediate;   Good Recent;   Good Remote;   Good  Judgement:  Good  Insight:  Good  Psychomotor Activity:  Normal  Concentration:  Concentration: Good and Attention Span: Good  Recall:  Good  Fund of Knowledge: Good  Language: Good  Akathisia:  No  Handed:  Right  AIMS (if indicated): not done  Assets:  Communication Skills Desire for Improvement Financial Resources/Insurance Housing Leisure Time Physical Health Social Support   ADL's:  Intact  Cognition: WNL  Sleep:  Good   Screenings: GAD-7    Flowsheet Row Clinical Support from 11/30/2020 in [facility]  Total GAD-7 Score 2      PHQ2-9    Flowsheet Row Clinical Support from 11/30/2020 in [facility] Office Visit from 03/17/2020 in [facility] Office Visit from 09/08/2019 in [facility] Office Visit from 03/04/2019 in [facility] Office Visit from 08/07/2018 in [facility]  PHQ-2 Total Score 0 0 1 0 0  PHQ-9 Total Score 1 0 -- -- --        Assessment and Plan: Patient notes that she is doing well on her current medication regimen.  No medication changes made today.  Patient agreeable to  continue medications as prescribed.  1. Schizoaffective disorder, depressive type (HCC)  Continue- paliperidone Palmitate ER (INVEGA TRINZA) 819 MG/2.63ML SUSY injection; Inject 2.63 mLs (819 mg total) into the muscle every 3 (three) months.  Dispense: 819 mL; Refill: 11   Follow-up in 3 months  Shanna Cisco, NP 11/30/2020, 9:28 AM

## 2021-01-26 ENCOUNTER — Telehealth (HOSPITAL_COMMUNITY): Payer: Self-pay | Admitting: *Deleted

## 2021-01-26 NOTE — Telephone Encounter (Signed)
[facility] PATIENT ASS[facility]  PATIENT is ELIGIBLE & ENROLLED FOR: Paliperidone Palmitate ER (INVEGA TRINZA) injection 819 mg  FROM: 08/31/2020 [facility]  08/31/2021

## 2021-03-02 ENCOUNTER — Ambulatory Visit (INDEPENDENT_AMBULATORY_CARE_PROVIDER_SITE_OTHER): Payer: No Payment, Other | Admitting: Psychiatry

## 2021-03-02 ENCOUNTER — Ambulatory Visit (HOSPITAL_COMMUNITY): Payer: No Payment, Other | Admitting: *Deleted

## 2021-03-02 ENCOUNTER — Other Ambulatory Visit: Payer: Self-pay

## 2021-03-02 ENCOUNTER — Encounter (HOSPITAL_COMMUNITY): Payer: Self-pay

## 2021-03-02 ENCOUNTER — Encounter (HOSPITAL_COMMUNITY): Payer: Self-pay | Admitting: Psychiatry

## 2021-03-02 VITALS — BP 195/117 | HR 115 | Ht 65.0 in | Wt 139.0 lb

## 2021-03-02 DIAGNOSIS — F251 Schizoaffective disorder, depressive type: Secondary | ICD-10-CM | POA: Diagnosis not present

## 2021-03-02 MED ORDER — INVEGA TRINZA 819 MG/2.63ML IM SUSY: 819.0000 mg | PREFILLED_SYRINGE | INTRAMUSCULAR | 11 refills | Status: DC

## 2021-03-02 NOTE — Progress Notes (Signed)
In as scheduled for her [name] injection, of 819 mg, given today in her R GLUTEAL muscle without issue. She brought her mom in today to meet [place] and for [place] to meet her and she also stayed present for her appt with Dr Doyne Keel 3 assessment. She is at baseline, and offers no complaints. 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 She is nicely dressed, pleasant and appropriate. She completed her patient assistance paperwork for her Invega inj. She is to return in 3 months for her next injection.

## 2021-03-02 NOTE — Progress Notes (Signed)
BH MD/PA/NP OP Progress Note  03/02/2021 9:43 AM Sandra Bell  MRN:  [id number]  Chief Complaint: " I am doing good"  HPI:  50 yo female seen today for follow-up psychiatric evaluation. She has a psychiatric history of schizoaffective disorder. Currently managed on Invaga trinza q 3 months IM. She notes her medication are effective in managing her psychiatric conditions.  Today the patient is pleasant, cooperative, and engaged in conversation, and maintained eye contact. She informed the writer that since last visit, she has been doing good. She notes that recently her sleep schedule has changed.  She notes that she will sleep around 7 or 10 and wakes up at 4 AM.  She notes that this new schedule has been more productive for her as she can get along/quiet time.  She notes that she meditates, reads, and cleans up before she has to start her day.  She does note that she still has auditory hallucinations but notes that she is able to cope with her negative voices.  She endorses adequate sleep and appetite.  Today she denies SI/HI/VH, mania, paranoia.    Patient was seen with her mother who notes that overall she is doing well.    No medication changes made today.  Patient agreeable to continue medication as prescribed.     Visit Diagnosis:    ICD-10-CM   1. Schizoaffective disorder, depressive type (HCC)  F25.1 paliperidone Palmitate ER (INVEGA TRINZA) 819 MG/2.63ML SUSY injection       Past Psychiatric History: Schizoaffective disorder, depressive type  Past Medical History:  Past Medical History:  Diagnosis Date   Hypertension    Schizoaffective disorder (HCC) 2003    Past Surgical History:  Procedure Laterality Date   FINGER SURGERY  1983    Family Psychiatric History: denied  Family History:  Family History  Problem Relation Age of Onset   Hyperlipidemia Mother    Hypertension Mother    Hyperlipidemia Father    Hypertension Father    Heart disease Father    Breast  cancer Sister     Social History:  Social History   Socioeconomic History   Marital status: Divorced    Spouse name: Not on file   Number of children: Not on file   Years of education: Not on file   Highest education level: Professional school degree (e.g., MD, DDS, DVM, JD)  Occupational History   Not on file  Tobacco Use   Smoking status: Never   Smokeless tobacco: Never  Vaping Use   Vaping Use: Never used  Substance and Sexual Activity   Alcohol use: No   Drug use: No   Sexual activity: Not Currently  Other Topics Concern   Not on file  Social History Narrative   Not on file   Social Determinants of Health   Financial Resource Strain: Not on file  Food Insecurity: Not on file  Transportation Needs: Not on file  Physical Activity: Not on file  Stress: Not on file  Social Connections: Not on file    Allergies: No Known Allergies  Metabolic Disorder Labs: Lab Results  Component Value Date   HGBA1C 4.6 (L) 03/17/2020   Lab Results  Component Value Date   PROLACTIN 7.4 11/20/2013   Lab Results  Component Value Date   CHOL 235 (H) 03/17/2020   TRIG 112 03/17/2020   HDL 43 03/17/2020   CHOLHDL 5.5 (H) 03/17/2020   VLDL 15 10/17/2015   LDLCALC 172 (H) 03/17/2020   LDLCALC 186 (  H) 03/04/2019   Lab Results  Component Value Date   TSH 1.750 04/01/2018   TSH 1.312 10/26/2014    Therapeutic Level Labs: No results found for: LITHIUM No results found for: VALPROATE No components found for:  CBMZ  Current Medications: Current Outpatient Medications  Medication Sig Dispense Refill   aspirin 81 MG tablet Take 81 mg by mouth daily.     hydrochlorothiazide (MICROZIDE) 12.5 MG capsule Take 1 capsule (12.5 mg total) by mouth daily. 90 capsule 3   paliperidone Palmitate ER (INVEGA TRINZA) 819 MG/2.63ML SUSY injection Inject 2.63 mLs (819 mg total) into the muscle every 3 (three) months. 819 mL 11   simvastatin (ZOCOR) 40 MG tablet Take 1 tablet (40 mg total)  by mouth at bedtime. 90 tablet 3   Current Facility-Administered Medications  Medication Dose Route Frequency Provider Last Rate Last Admin   Paliperidone Palmitate ER (INVEGA TRINZA) injection 819 mg  819 mg Intramuscular Q90 days Zena Amos, MD   819 mg at 03/02/21 8563     Musculoskeletal: Strength & Muscle Tone: within normal limits Gait & Station: normal Patient leans: N/A  Psychiatric Specialty Exam: Review of Systems  There were no vitals taken for this visit.There is no height or weight on file to calculate BMI.  General Appearance: Well Groomed  Eye Contact:  Good  Speech:  Clear and Coherent and Normal Rate  Volume:  Normal  Mood:  Euthymic  Affect:  Appropriate and Congruent  Thought Process:  Coherent, Goal Directed, and Linear  Orientation:  Full (Time, Place, and Person)  Thought Content: WDL and Logical   Suicidal Thoughts:  No  Homicidal Thoughts:  No  Memory:  Immediate;   Good Recent;   Good Remote;   Good  Judgement:  Good  Insight:  Good  Psychomotor Activity:  Normal  Concentration:  Concentration: Good and Attention Span: Good  Recall:  Good  Fund of Knowledge: Good  Language: Good  Akathisia:  No  Handed:  Right  AIMS (if indicated): not done  Assets:  Communication Skills Desire for Improvement Financial Resources/Insurance Housing Leisure Time Physical Health Social Support  ADL's:  Intact  Cognition: WNL  Sleep:  Good   Screenings: GAD-7    Flowsheet Row Clinical Support from 03/02/2021 in [facility] Clinical Support from 11/30/2020 in [facility]  Total GAD-7 Score 2 2      PHQ2-9    Flowsheet Row Clinical Support from 03/02/2021 in [facility] Clinical Support from 11/30/2020 in [facility] Office Visit from 03/17/2020 in [facility] Office Visit from 09/08/2019 in [facility] Office Visit from 03/04/2019 in [facility]  PHQ-2 Total Score 0 0 0 1 0  PHQ-9 Total Score 1 1 0 -- --        Assessment and Plan: Patient notes that she is doing well on her current medication regimen.  No medication changes made today.  Patient agreeable to continue medications as prescribed.  1. Schizoaffective disorder, depressive type (HCC)  Continue- paliperidone Palmitate ER (INVEGA TRINZA) 819 MG/2.63ML SUSY injection; Inject 2.63 mLs (819 mg total) into the muscle every 3 (three) months.  Dispense: 819 mL; Refill: 11   Follow-up in 3 months  Shanna Cisco, NP 03/02/2021, 9:43 AM

## 2021-04-12 ENCOUNTER — Ambulatory Visit: Payer: Self-pay | Admitting: *Deleted

## 2021-04-12 ENCOUNTER — Other Ambulatory Visit: Payer: Self-pay

## 2021-04-12 ENCOUNTER — Ambulatory Visit: Payer: Self-pay | Admitting: Physician Assistant

## 2021-04-12 VITALS — BP 112/77 | HR 99 | Temp 98.2°F | Resp 18 | Ht 65.0 in | Wt 139.0 lb

## 2021-04-12 DIAGNOSIS — I10 Essential (primary) hypertension: Secondary | ICD-10-CM

## 2021-04-12 DIAGNOSIS — N6321 Unspecified lump in the left breast, upper outer quadrant: Secondary | ICD-10-CM

## 2021-04-12 DIAGNOSIS — N644 Mastodynia: Secondary | ICD-10-CM

## 2021-04-12 MED ORDER — HYDROCHLOROTHIAZIDE 12.5 MG PO CAPS
12.5000 mg | ORAL_CAPSULE | Freq: Every day | ORAL | 3 refills | Status: DC
Start: 2021-04-12 — End: 2021-07-19
  Filled 2021-04-12: qty 30, 30d supply, fill #0

## 2021-04-12 NOTE — Progress Notes (Signed)
New Patient Office Visit  Subjective:  Patient ID: Sandra Bell, female    DOB: December 25, 1970  Age: 51 y.o. MRN: [id number]  CC:  Chief Complaint  Patient presents with   Breast Problem    discharge    HPI Sandra Bell states that she started having bilateral breast tenderness for the past 2 months, describes it as burning, tender to the touch.  States that she does feel it is worsening.  States that she started having cloudy breast discharge earlier today.  States that the pain has been worsening and yesterday started having a milky discharge from around the nipple area.  States that she has not tried anything for the pain does endorse that she is scared to take any type of medications for pain.  Does endorse that she will take cold showers with some relief.  Does endorse a significant history of suspicious lump in her left breast, states that she did decline further evaluation with biopsy due to fear of pain and financial constraints.  Does endorse that she receives Invega injections, last injection was March 02, 2021, states that she has been on this injection for 4 years.  No recent dose changes.  States that she does not check her blood pressure at home, denies any hypertensive symptoms.  States that she has been out of her blood pressure medications for at least the past year due to no longer having a primary care provider.  Note from chart regarding breast biopsy: (03/18/20) -   Called Sandra Bell to inform her that most of her labs are normal.  She was informed that her cholesterol remains elevated which is likely because she is not been taking her cholesterol medicine.  While on the phone, we had a conversation which included the Bell of Sandra Bell.  The 3 of [place] discussed Sandra Bell's recent work-up for breast lump.  They were both informed that I was concerned about some of the characteristics of her breast lump that were consistent with cancer.  I shared my concern that  this should be the #1 priority for health right now.  I informed him that I would be happy to help with anything that would make a breast biopsy more comfortable or easier for Sandra Bell.  Sandra Bell appreciated my input and informed me that they would not like to move forward with a breast biopsy at this time due to Sandra Bell's aversion to needles and the family's financial situation.  They were encouraged to call back if anything changed.   Written by Matilde Haymaker, MD    Past Medical History:  Diagnosis Date   Hypertension    Schizoaffective disorder ([place]) 2003    Past Surgical History:  Procedure Laterality Date   FINGER SURGERY  1983    Family History  Problem Relation Age of Onset   Hyperlipidemia Bell    Hypertension Bell    Hyperlipidemia Father    Hypertension Father    Heart disease Father    Breast cancer Sister     Social History   Socioeconomic History   Marital status: Divorced    Spouse name: Not on file   Number of children: Not on file   Years of education: Not on file   Highest education level: Professional school degree (e.g., MD, DDS, DVM, JD)  Occupational History   Not on file  Tobacco Use   Smoking status: Never   Smokeless tobacco: Never  Vaping Use   Vaping Use:  Never used  Substance and Sexual Activity   Alcohol use: No   Drug use: No   Sexual activity: Not Currently  Other Topics Concern   Not on file  Social History Narrative   Not on file   Social Determinants of Health   Financial Resource Strain: Not on file  Food Insecurity: Not on file  Transportation Needs: Not on file  Physical Activity: Not on file  Stress: Not on file  Social Connections: Not on file  Intimate Partner Violence: Not on file    ROS Review of Systems  Constitutional:  Negative for chills and fever.  HENT: Negative.    Eyes: Negative.   Respiratory:  Negative for shortness of breath.   Cardiovascular:  Negative for chest  pain.  Gastrointestinal:  Negative for abdominal pain, nausea and vomiting.  Endocrine: Negative.   Genitourinary: Negative.   Musculoskeletal: Negative.   Skin:  Negative for rash and wound.  Allergic/Immunologic: Negative.   Neurological: Negative.   Hematological: Negative.   Psychiatric/Behavioral: Negative.     Objective:   Today's Vitals: BP 112/77 (BP Location: Left Arm, Patient Position: Sitting, Cuff Size: Normal)    Pulse 99    Temp 98.2 F (36.8 C) (Oral)    Resp 18    Ht 5\' 5"  (1.651 m)    Wt 139 lb (63 kg)    LMP  (LMP Unknown)    SpO2 98%    BMI 23.13 kg/m   Physical Exam Vitals and nursing note reviewed. Exam conducted with a chaperone present.  Constitutional:      Appearance: Normal appearance.  HENT:     Head: Normocephalic and atraumatic.     Right Ear: External ear normal.     Left Ear: External ear normal.     Nose: Nose normal.     Mouth/Throat:     Mouth: Mucous membranes are moist.     Pharynx: Oropharynx is clear.  Eyes:     Extraocular Movements: Extraocular movements intact.     Conjunctiva/sclera: Conjunctivae normal.     Pupils: Pupils are equal, round, and reactive to light.  Cardiovascular:     Rate and Rhythm: Normal rate and regular rhythm.     Pulses: Normal pulses.     Heart sounds: Normal heart sounds.  Pulmonary:     Effort: Pulmonary effort is normal.     Breath sounds: Normal breath sounds.  Chest:  Breasts:    Right: Skin change and tenderness present.     Left: Skin change and tenderness present.     Comments: Due to extreme tenderness to light touch, palpation of breast was not completed   Milky discharge from right breast near nipple Musculoskeletal:        General: Normal range of motion.     Cervical back: Normal range of motion and neck supple.  Neurological:     Mental Status: She is alert.  Psychiatric:        Attention and Perception: Attention normal.        Mood and Affect: Mood normal.        Speech: Speech  normal.        Behavior: Behavior normal. Behavior is cooperative.        Thought Content: Thought content normal.        Cognition and Memory: Cognition normal.        Judgment: Judgment normal.    Assessment & Plan:   Problem List Items Addressed This Visit  Cardiovascular and Mediastinum   Essential hypertension   Relevant Medications   hydrochlorothiazide (MICROZIDE) 12.5 MG capsule     Other   Breast lump   Relevant Orders   MM DIAG BREAST TOMO BILATERAL   Other Visit Diagnoses     Breast tenderness    -  Primary   Relevant Orders   Prolactin (Completed)   CBC with Differential/Platelet (Completed)   Comp. Metabolic Panel (12) (Completed)       Outpatient Encounter Medications as of 04/12/2021  Medication Sig   aspirin 81 MG tablet Take 81 mg by mouth daily.   hydrochlorothiazide (MICROZIDE) 12.5 MG capsule Take 1 capsule (12.5 mg total) by mouth daily.   paliperidone Palmitate ER (INVEGA TRINZA) 819 MG/2.63ML SUSY injection Inject 2.63 mLs (819 mg total) into the muscle every 3 (three) months.   simvastatin (ZOCOR) 40 MG tablet Take 1 tablet (40 mg total) by mouth at bedtime.   [DISCONTINUED] hydrochlorothiazide (MICROZIDE) 12.5 MG capsule Take 1 capsule (12.5 mg total) by mouth daily.   Facility-Administered Encounter Medications as of 04/12/2021  Medication   Paliperidone Palmitate ER (INVEGA TRINZA) injection 819 mg   1. Breast tenderness Patient education given on supportive care, will check prolactin levels, reach out to behavioral health provider if levels are elevated.   - Prolactin - CBC with Differential/Platelet - Comp. Metabolic Panel (12)  2. Essential hypertension Restart hydrochlorothiazide.  Patient encouraged to check blood pressure at home, keep a written log and have available for all office visits.  [facility]  Red flags given for prompt  reevaluation - hydrochlorothiazide (MICROZIDE) 12.5 MG capsule; Take 1 capsule (12.5 mg total) by mouth daily.  Dispense: 90 capsule; Refill: 3  3. Mass of upper outer quadrant of left breast Patient agreeable to address previous suspicious findings.    IMPRESSION: 1. 1.9 cm mass in the 1 o'clock position of the left breast, 1 cm from the nipple with ultrasound features suggesting a fibroadenoma or, possibly, a papilloma. This is symptomatic and malignancy cannot be excluded. 2. 2 abnormal left axillary lymph nodes. The 7 mm mid left axillary lymph node does not have the typical appearance of a reactive node and is suspicious for the possibility of a metastatic node. The more inferior node could represent a reactive node or metastatic node.   RECOMMENDATION: 1. Ultrasound-guided core needle biopsy of the 1.9 cm mass in the 1 o'clock position of the left breast. 2. Ultrasound-guided core needle biopsy of the abnormal 7 mm mid left axillary lymph node.   This has been discussed with the patient and the biopsies have been scheduled at 12:45 p.m. 07/01/2019.   I have discussed the findings and recommendations with the patient. If applicable, a reminder letter will be sent to the patient regarding the next appointment.   BI-RADS CATEGORY  4: Suspicious.     Electronically Signed   By: Claudie Revering M.D.   On: 06/18/2019 16:21    - MM DIAG BREAST TOMO BILATERAL; Future   I have reviewed the patient's medical history (PMH, PSH, Social History, Family History, Medications, and allergies) , and have been updated if relevant. I spent 30 minutes reviewing chart and  face to face time with patient.    Follow-up: Return in about 5 weeks (around 05/18/2021) for [facility].   Loraine Grip Mayers, PA-C

## 2021-04-12 NOTE — Progress Notes (Signed)
Patient reports a cloudy discharge beginning this morning after experiencing breat tenderness for the past few months.

## 2021-04-12 NOTE — Telephone Encounter (Signed)
Summary: Breasts feel like they're burning   Pt called reporting that her breasts are tender to the touch. She says they feel like they're stinging inside. She has schizophrenia, she is scheduled for march 9th. She says her breast issues have been going on for 2 months getting worse, says that she brushed up against one side this morning and it felt like it was burning inside.   Best contact: [phone]      Reason for Disposition  [1] Breast pain AND [2] cause is not known  Answer Assessment - Initial Assessment Questions 1. SYMPTOM: "What's the main symptom you're concerned about?"  (e.g., lump, pain, rash, nipple discharge)     Tenderness, burning sensation 2. LOCATION: "Where is the tenderness located?"     Bilateral 3. ONSET: "When did tenderness  start?"     Few months- seems to be increasing 4. PRIOR HISTORY: "Do you have any history of prior problems with your breasts?" (e.g., lumps, cancer, fibrocystic breast disease)     2 years ago- rec breast biopsy- patient did not do 5. CAUSE: "What do you think is causing this symptom?"     unsure 6. OTHER SYMPTOMS: "Do you have any other symptoms?" (e.g., fever, breast pain, redness or rash, nipple discharge)     Breast pain 7. PREGNANCY-BREASTFEEDING: "Is there any chance you are pregnant?" "When was your last menstrual period?" "Are you breastfeeding?"     *No Answer*  Protocols used: Breast Symptoms-A-AH

## 2021-04-12 NOTE — Patient Instructions (Signed)
We will call you with today's lab results.  I do encourage you to restart the hydrochlorothiazide on a daily basis, I encourage you to check your blood pressure on a daily basis, keep a written log and have available for all office visits.  Please let us know if there is anything else we can do for you  Kennieth Rad, PA-C Physician Assistant [place] [email or web address]    Breast Tenderness Breast tenderness is a common problem for women of all ages, but may also occur in men. Breast tenderness may range from mild discomfort to severe pain. In women, the pain usually comes and goes with the menstrual cycle, but it can also be constant. Breast tenderness has many possible causes, including hormone changes, infections, and taking certain medicines. You may have tests, such as a mammogram or an ultrasound, to check for any unusual findings. Having breast tenderness usually does not mean that you have breast cancer. Follow these instructions at home: Managing pain and discomfort  If directed, put ice to the painful area. To do this: Put ice in a plastic bag. Place a towel between your skin and the bag. Leave the ice on for 20 minutes, 2-3 times a day. Wear a supportive bra, especially during exercise. You may also want to wear a supportive bra while sleeping if your breasts are very tender. Medicines Take over-the-counter and prescription medicines only as told by your health care provider. If the cause of your pain is infection, you may be prescribed an antibiotic medicine. If you were prescribed an antibiotic, take it as told by your health care provider. Do not stop taking the antibiotic even if you start to feel better. Eating and drinking Your health care provider may recommend that you lessen the amount of fat in your diet. You can do this by: Limiting fried foods. Cooking foods using methods such as baking, boiling,  grilling, and broiling. Decrease the amount of caffeine in your diet. Instead, drink more water and choose caffeine-free drinks. General instructions  Keep a log of the days and times when your breasts are most tender. Ask your health care provider how to do breast exams at home. This will help you notice if you have an unusual growth or lump. Keep all follow-up visits as told by your health care provider. This is important. Contact a health care provider if: Any part of your breast is hard, red, and hot to the touch. This may be a sign of infection. You are a woman and: Not breastfeeding and you have fluid, especially blood or pus, coming out of your nipples. Have a new or painful lump in your breast that remains after your menstrual period ends. You have a fever. Your pain does not improve or it gets worse. Your pain is interfering with your daily activities. Summary Breast tenderness may range from mild discomfort to severe pain. Breast tenderness has many possible causes, including hormone changes, infections, and taking certain medicines. It can be treated with ice, wearing a supportive bra, and medicines. Make changes to your diet if told to by your health care provider. This information is not intended to replace advice given to you by your health care provider. Make sure you discuss any questions you have with your health care provider. Document Revised: 07/21/2018 Document Reviewed: 07/21/2018 Elsevier Patient Education  [place].

## 2021-04-12 NOTE — Telephone Encounter (Signed)
°  Chief Complaint: breast pain Symptoms: breast pain Frequency: few months- but getting worse Pertinent Negatives: Patient denies fever, redness, discharge Disposition: [] ED /[] Urgent Care (no appt availability in office) / [] Appointment(In office/virtual)/ []  [facility]/ [] Home Care/ [] Refused Recommended Disposition /[x] [facility]/ []  Follow-up with PCP Additional Notes: Patient is not a patient at [facility] health- she is advised mobile unit for evaluation- patient is concerned because she does not have insurance. At end of call she states she has just started to have breast discharge- cloudy- advised mobile unit

## 2021-04-13 ENCOUNTER — Other Ambulatory Visit: Payer: Self-pay | Admitting: Physician Assistant

## 2021-04-13 ENCOUNTER — Telehealth: Payer: Self-pay | Admitting: Physician Assistant

## 2021-04-13 ENCOUNTER — Encounter: Payer: Self-pay | Admitting: Physician Assistant

## 2021-04-13 ENCOUNTER — Telehealth: Payer: Self-pay | Admitting: *Deleted

## 2021-04-13 ENCOUNTER — Other Ambulatory Visit (HOSPITAL_COMMUNITY): Payer: Self-pay | Admitting: Psychiatry

## 2021-04-13 DIAGNOSIS — R7989 Other specified abnormal findings of blood chemistry: Secondary | ICD-10-CM

## 2021-04-13 DIAGNOSIS — N6320 Unspecified lump in the left breast, unspecified quadrant: Secondary | ICD-10-CM

## 2021-04-13 LAB — CBC WITH DIFFERENTIAL/PLATELET
Basophils Absolute: 0 10*3/uL (ref 0.0–0.2)
Basos: 1 %
EOS (ABSOLUTE): 0 10*3/uL (ref 0.0–0.4)
Eos: 1 %
Hematocrit: 40.9 % (ref 34.0–46.6)
Hemoglobin: 13.8 g/dL (ref 11.1–15.9)
Immature Grans (Abs): 0 10*3/uL (ref 0.0–0.1)
Immature Granulocytes: 0 %
Lymphocytes Absolute: 2 10*3/uL (ref 0.7–3.1)
Lymphs: 45 %
MCH: 31.2 pg (ref 26.6–33.0)
MCHC: 33.7 g/dL (ref 31.5–35.7)
MCV: 92 fL (ref 79–97)
Monocytes Absolute: 0.4 10*3/uL (ref 0.1–0.9)
Monocytes: 8 %
Neutrophils Absolute: 2.1 10*3/uL (ref 1.4–7.0)
Neutrophils: 45 %
Platelets: 411 10*3/uL (ref 150–450)
RBC: 4.43 x10E6/uL (ref 3.77–5.28)
RDW: 12.2 % (ref 11.7–15.4)
WBC: 4.6 10*3/uL (ref 3.4–10.8)

## 2021-04-13 LAB — COMP. METABOLIC PANEL (12)
AST: 16 IU/L (ref 0–40)
Albumin/Globulin Ratio: 1.5 (ref 1.2–2.2)
Albumin: 4.7 g/dL (ref 3.8–4.9)
Alkaline Phosphatase: 72 IU/L (ref 44–121)
BUN/Creatinine Ratio: 7 — ABNORMAL LOW (ref 9–23)
BUN: 7 mg/dL (ref 6–24)
Bilirubin Total: 0.7 mg/dL (ref 0.0–1.2)
Calcium: 10.2 mg/dL (ref 8.7–10.2)
Chloride: 102 mmol/L (ref 96–106)
Creatinine, Ser: 1.02 mg/dL — ABNORMAL HIGH (ref 0.57–1.00)
Globulin, Total: 3.2 g/dL (ref 1.5–4.5)
Glucose: 81 mg/dL (ref 70–99)
Potassium: 4 mmol/L (ref 3.5–5.2)
Sodium: 142 mmol/L (ref 134–144)
Total Protein: 7.9 g/dL (ref 6.0–8.5)
eGFR: 66 mL/min/{1.73_m2} (ref >59)

## 2021-04-13 LAB — PROLACTIN: Prolactin: 142 ng/mL — ABNORMAL HIGH (ref 4.8–23.3)

## 2021-04-13 NOTE — Telephone Encounter (Signed)
-----   Message from Roney Jaffe, [place] sent at 04/13/2021 11:14 AM EST ----- Please call patient and let her know that her kidney function and liver function are within normal limits, she does show signs of dehydration.  She does not show signs of anemia.  Her prolactin levels are elevated, this is more than likely due to the Invega injections and more than likely what is causing her breast tenderness and milky discharge.  I am reaching out to her behavioral health specialist to help with further evaluation of potential medication changes.

## 2021-04-13 NOTE — Progress Notes (Signed)
Good morning,  I  saw above patient on our mobile unit yesterday with the complaint of 2 months of breast tenderness and one day of milky discharge from right breast.  She does have elevated prolactin levels.  I understand that she received her last Invega injection 03/02/21 and has been on this medication for 4 years and no recent dose changes.  What do you think would be an appropriate treatment?  Note: She is scheduled for repeat mammogram and [place] due to suspicious findings in left breast in 2021 that have not been addressed due to patient's fears and financial constraints.    Message sent to Eulis Canner, NP through secure chat, documented for chart .  Kennieth Rad, PA-C Physician Assistant [facility] [email or web address]

## 2021-04-13 NOTE — Telephone Encounter (Signed)
Patient verified DOB Patient is aware of labs and needing to FU with [facility] provider and breast center. Patient has spoken with [facility] provider and left information with Breast Center to complete scholarship for diagnostic mammogram. Patient aware of calling [facility] back next week if she does not receive next steps by the beginning of the week.

## 2021-04-13 NOTE — Telephone Encounter (Signed)
Noted  

## 2021-04-13 NOTE — Progress Notes (Signed)
Hello I spoke to the patient and confirmed the above.  I told her the risk and benefits of continued Invega with her elevated prolactin levels and she informed me that the benefits of Invega outweigh the side effects of her elevated prolactin level.  At this time she wants to continue [place].  I offered her a lower dose but she refused it.  I also followed up with my attending physician Alyse Low) to discuss the above and was instructed to place a neurology consult.  This referral was placed.  Provider called patient to discuss the referral. She notes that at this time she feels overwhelmed and wants to deal with one issue at a time. She reports that she will consider following up with neurology. No other concerns noted at this time.

## 2021-04-18 ENCOUNTER — Ambulatory Visit: Payer: Self-pay | Attending: Family Medicine

## 2021-04-19 ENCOUNTER — Other Ambulatory Visit: Payer: Self-pay

## 2021-04-20 ENCOUNTER — Other Ambulatory Visit (HOSPITAL_COMMUNITY): Payer: Self-pay | Admitting: Psychiatry

## 2021-04-20 DIAGNOSIS — R7989 Other specified abnormal findings of blood chemistry: Secondary | ICD-10-CM

## 2021-05-18 ENCOUNTER — Ambulatory Visit: Payer: Self-pay | Attending: Internal Medicine | Admitting: Internal Medicine

## 2021-05-18 ENCOUNTER — Other Ambulatory Visit: Payer: Self-pay

## 2021-05-18 ENCOUNTER — Encounter (HOSPITAL_COMMUNITY): Payer: No Payment, Other | Admitting: Psychiatry

## 2021-05-18 ENCOUNTER — Encounter: Payer: Self-pay | Admitting: Internal Medicine

## 2021-05-18 VITALS — BP 166/101 | HR 117 | Ht 66.0 in | Wt 136.8 lb

## 2021-05-18 DIAGNOSIS — F251 Schizoaffective disorder, depressive type: Secondary | ICD-10-CM

## 2021-05-18 DIAGNOSIS — Z7689 Persons encountering health services in other specified circumstances: Secondary | ICD-10-CM

## 2021-05-18 DIAGNOSIS — E782 Mixed hyperlipidemia: Secondary | ICD-10-CM

## 2021-05-18 DIAGNOSIS — I10 Essential (primary) hypertension: Secondary | ICD-10-CM

## 2021-05-18 DIAGNOSIS — Z8639 Personal history of other endocrine, nutritional and metabolic disease: Secondary | ICD-10-CM | POA: Insufficient documentation

## 2021-05-18 DIAGNOSIS — E221 Hyperprolactinemia: Secondary | ICD-10-CM

## 2021-05-18 DIAGNOSIS — Z87898 Personal history of other specified conditions: Secondary | ICD-10-CM | POA: Insufficient documentation

## 2021-05-18 DIAGNOSIS — Z803 Family history of malignant neoplasm of breast: Secondary | ICD-10-CM | POA: Insufficient documentation

## 2021-05-18 NOTE — Progress Notes (Signed)
Patient ID: Sandra Bell, female    DOB: [birth year]  MRN: [id number]  CC: New Patient (Initial Visit) (New patient , est care  no concerns )   Subjective: Sandra Bell is a 51 y.o. female who presents for new pt visit Her concerns today include:  Pt with hx of HTN, HL, schizoaffective disorder, breast lump LT 06/2019 (bx recommend but pt declined due to financial constraints and fears about having bx)  Previous PCP was at Dr. Mirian Mo with [facility].  Decided to change due to lack of insurance.   Hx of schizoaffective disorder:  followed by NP Sandra Bell.  On Invega inj.  Feels stable on med.  Next appt later this mth  Seen by our PA at [place] [facility] 04/12/2021 with c/o breasts feeling tender with milk like leakage from RT breast near nipple Prolactin level elev 142.  Symptoms thought to be due to [facility]. PA touched based with her [facility] provider NP Sandra Bell Her [facility] provider discussed changing or lower dose on Invega but pt declined Today patient tells me that the symptoms of breast tenderness Lecky discharge from the right breast have resolved.    Patient with previous abnormal mammogram in 2021 which showed a suspicious mass in the right breast.  Biopsy was recommended but patient declined due to her aversion to needles financial restraints.   Younger sister had lumpectomy/XRT for breast cancer about 5 years ago.  Her sister has reportedly tested negative for the cancer gene   She was already called for MMG and [place] as was ordered by PA Sandra Bell last mth.  Pt declined because of lack of insurance.   She is in the process of applying for the orange card/cone discount.  Awaiting documents from IRS to put with the application  HTN:  Hx of HTN from previous PCP and was on HCTZ but was off it for a while -Rxn refill by PA last mths but pt has not take it Has been checking BP for past several days and has readings:  118/87, 114/84, 117/74, 102/82, 104/81 and 120/98.  She feels blood pressure is  elevated today because she lives nervous. -reports diligence in limiting salt in foods  Hx of HL.  Thinks due to psychiatric meds.  Simvastatin caused heart racing so she d/c taking.  Trying to eat right and exercise.  Does 10 min Yoga daily.    Patient Active Problem List   Diagnosis Date Noted   Breast lump 03/17/2020   Essential hypertension 02/05/2012   Schizoaffective disorder, depressive type (HCC) 01/03/2012   HLD (hyperlipidemia) 01/03/2012     Current Outpatient Medications on File Prior to Visit  Medication Sig Dispense Refill   hydrochlorothiazide (MICROZIDE) 12.5 MG capsule Take 1 capsule (12.5 mg total) by mouth daily. 90 capsule 3   paliperidone Palmitate ER (INVEGA TRINZA) 819 MG/2.63ML SUSY injection Inject 2.63 mLs (819 mg total) into the muscle every 3 (three) months. 819 mL 11   aspirin 81 MG tablet Take 81 mg by mouth daily. (Patient not taking: Reported on 05/18/2021)     simvastatin (ZOCOR) 40 MG tablet Take 1 tablet (40 mg total) by mouth at bedtime. (Patient not taking: Reported on 05/18/2021) 90 tablet 3   Current Facility-Administered Medications on File Prior to Visit  Medication Dose Route Frequency Provider Last Rate Last Admin   Paliperidone Palmitate ER (INVEGA TRINZA) injection 819 mg  819 mg Intramuscular Q90 days Zena Amos, MD   819 mg at 03/02/21 [phone]  No Known Allergies  Social History   Socioeconomic History   Marital status: Divorced    Spouse name: Not on file   Number of children: Not on file   Years of education: Not on file   Highest education level: Professional school degree (e.g., MD, DDS, DVM, JD)  Occupational History   Not on file  Tobacco Use   Smoking status: Never   Smokeless tobacco: Never  Vaping Use   Vaping Use: Never used  Substance and Sexual Activity   Alcohol use: No   Drug use: No   Sexual activity: Not Currently  Other Topics Concern   Not on file  Social History Narrative   Not on file   Social  Determinants of Health   Financial Resource Strain: Not on file  Food Insecurity: Not on file  Transportation Needs: Not on file  Physical Activity: Not on file  Stress: Not on file  Social Connections: Not on file  Intimate Partner Violence: Not on file    Family History  Problem Relation Age of Onset   Hyperlipidemia Mother    Hypertension Mother    Hyperlipidemia Father    Hypertension Father    Heart disease Father    Breast cancer Sister     Past Surgical History:  Procedure Laterality Date   FINGER SURGERY  1983    ROS: Review of Systems Negative except as stated above  PHYSICAL EXAM: BP (!) 166/101    Pulse (!) 117    Ht 5\' 6"  (1.676 m)    Wt 136 lb 12.8 oz (62.1 kg)    LMP  (LMP Unknown)    SpO2 100%    BMI 22.08 kg/m   Physical Exam 169/127, P 107 General appearance - alert, well appearing, middle-age African-American female and in no distress Mental status - normal mood, behavior, speech, dress, motor activity, and thought processes Mouth - mucous membranes moist, pharynx normal without lesions Neck - supple, no significant adenopathy Chest - clear to auscultation, no wheezes, rales or rhonchi, symmetric air entry Heart - normal rate, regular rhythm, normal S1, S2, no murmurs, rubs, clicks or gallops Breasts -CMA Sandra Bell present for breast exam: No axillary lymphadenopathy appreciated.  Patient with dimpling skin changes of the right areola area.  No discharge noted on palpation of the nipple.  No abnormal test.  Left breast: No discharge expressed from the mid of.  No abnormal masses felt. Extremities - peripheral pulses normal, no pedal edema, no clubbing or cyanosis   CMP Latest Ref Rng & Units 04/12/2021 03/17/2020 03/04/2019  Glucose 70 - 99 mg/dL 81 87 79  BUN 6 - 24 mg/dL 7 5(L) 8  Creatinine 4.090.57 - 1.00 mg/dL 8.11(B1.02(H) 1.47(W1.02(H) 2.950.91  Sodium 134 - 144 mmol/L 142 143 143  Potassium 3.5 - 5.2 mmol/L 4.0 4.3 3.8  Chloride 96 - 106 mmol/L 102 105 102   CO2 20 - 29 mmol/L - 23 24  Calcium 8.7 - 10.2 mg/dL 62.110.2 9.9 10.3(H)  Total Protein 6.0 - 8.5 g/dL 7.9 - 7.6  Total Bilirubin 0.0 - 1.2 mg/dL 0.7 - 0.8  Alkaline Phos 44 - 121 IU/L 72 - 52  AST 0 - 40 IU/L 16 - 16  ALT 0 - 32 IU/L - - 10   Lipid Panel     Component Value Date/Time   CHOL 235 (H) 03/17/2020 0901   TRIG 112 03/17/2020 0901   HDL 43 03/17/2020 0901   CHOLHDL 5.5 (H) 03/17/2020 0901  CHOLHDL 2.7 10/17/2015 0959   VLDL 15 10/17/2015 0959   LDLCALC 172 (H) 03/17/2020 0901    CBC    Component Value Date/Time   WBC 4.6 04/12/2021 1722   WBC 5.5 11/22/2014 0850   RBC 4.43 04/12/2021 1722   RBC 4.10 11/22/2014 0850   HGB 13.8 04/12/2021 1722   HCT 40.9 04/12/2021 1722   PLT 411 04/12/2021 1722   MCV 92 04/12/2021 1722   MCH 31.2 04/12/2021 1722   MCH 32.4 11/22/2014 0850   MCHC 33.7 04/12/2021 1722   MCHC 33.8 11/22/2014 0850   RDW 12.2 04/12/2021 1722   LYMPHSABS 2.0 04/12/2021 1722   MONOABS 0.6 11/22/2014 0850   EOSABS 0.0 04/12/2021 1722   BASOSABS 0.0 04/12/2021 1722    ASSESSMENT AND PLAN: 1. Establishing care with new doctor, encounter for  2. History of lump of left breast I had a long discussion with the patient today.  She wanted to get my taken recommendation needing a breast biopsy.  I told patient that finding of skin changes on the right breast is concerning.  Sometimes this can be due to Paget's breast and some inflammatory breast cancer can present like this.  I recommend moving forward with getting the mammogram and ultrasound of the left breast and axilla as was ordered last month.  If this shows that the mass in the left breast is still present and the radiologist recommends biopsy, I think she should move forward with having the biopsy done.  I told her that we can give her a dose of Valium to take prior to the procedure to help relax her.  Advised that they do anesthetize the area before they do the biopsy.  Other option would be to have  her see a surgeon about having biopsy/lumpectomy under general anesthesia.  After having this discussion, patient has decided that she would have the biopsy done if it still needs to be done.  We will get the mammogram first since she also has some skin changes as noted on exam on the right breast. We will try to have her get this through the [facility] program.   - US BREAST LTD UNI LEFT INC AXILLA; Future - MM DIAG BREAST TOMO BILATERAL; Future  3. Family history of breast cancer See discussion above - US BREAST LTD UNI LEFT INC AXILLA; Future - MM DIAG BREAST TOMO BILATERAL; Future  4. Essential hypertension Quite elevated today compared to home blood pressure reading.  Likely a component of whitecoat hypertension. DASH diet discussed and encouraged. Hold off on starting HCTZ.  I will have her follow-up with our clinical pharmacist for repeat blood pressure check.  Advised to continue checking blood pressure and bring her readings with her along with her home blood pressure monitoring device.  5. Mixed hyperlipidemia Hyperlipidemia can be seen in patients on Invega/antipsychotics. Dietary counseling given. Continue yoga but encouraged her to get in some form of moderate intensity exercise at least 3 to 5 days a week for 30 minutes. - Lipid panel  6. Schizoaffective disorder, depressive type (HCC) Followed by behavioral health.  Patient states that she has an upcoming appointment where she will discuss with her behavioral health provider further about changing or decreasing the dose of Invega. Hyperprolactinemia most likely due to [place].  Patient without nipple discharge at this time.  7.  Hyperprolactinemia See #6 above   Patient was given the opportunity to ask questions.  Patient verbalized understanding of the plan and was able to  repeat key elements of the plan.   This documentation was completed using Paediatric nurse.  Any transcriptional errors are  unintentional.  Orders Placed This Encounter  Procedures   US BREAST LTD UNI LEFT INC AXILLA   MM DIAG BREAST TOMO BILATERAL   Lipid panel     Requested Prescriptions    No prescriptions requested or ordered in this encounter    Return in about 2 months (around 07/18/2021) for Appt with [facility] in 2 wks for BP check.  Jonah Blue, MD, FACP

## 2021-05-18 NOTE — Patient Instructions (Signed)
Continue to check your blood pressure at least twice a week.  We will have you scheduled to see our clinical pharmacist in 2 weeks for blood pressure recheck.  Please bring your blood pressure device with you. ? ?Try to get in some form of moderate intensity exercise at least 3 to 5 days a week for 30 minutes. ? ?We have given you the form for the mammogram scholarship to get your mammogram/breast ultrasound done.  Once I get the results of that, we will determine whether you still need to have a breast biopsy done. ? ?Healthy Eating ?Following a healthy eating pattern may help you to achieve and maintain a healthy body weight, reduce the risk of chronic disease, and live a long and productive life. It is important to follow a healthy eating pattern at an appropriate calorie level for your body. Your nutritional needs should be met primarily through food by choosing a variety of nutrient-rich foods. ?What are tips for following this plan? ?Reading food labels ?Read labels and choose the following: ?Reduced or low sodium. ?Juices with 100% fruit juice. ?Foods with low saturated fats and high polyunsaturated and monounsaturated fats. ?Foods with whole grains, such as whole wheat, cracked wheat, brown rice, and wild rice. ?Whole grains that are fortified with folic acid. This is recommended for women who are pregnant or who want to become pregnant. ?Read labels and avoid the following: ?Foods with a lot of added sugars. These include foods that contain brown sugar, corn sweetener, corn syrup, dextrose, fructose, glucose, high-fructose corn syrup, honey, invert sugar, lactose, malt syrup, maltose, molasses, raw sugar, sucrose, trehalose, or turbinado sugar. ?Do not eat more than the following amounts of added sugar per day: ?6 teaspoons (25 g) for women. ?9 teaspoons (38 g) for men. ?Foods that contain processed or refined starches and grains. ?Refined grain products, such as white flour, degermed cornmeal, white bread,  and white rice. ?Shopping ?Choose nutrient-rich snacks, such as vegetables, whole fruits, and nuts. Avoid high-calorie and high-sugar snacks, such as potato chips, fruit snacks, and candy. ?Use oil-based dressings and spreads on foods instead of solid fats such as butter, stick margarine, or cream cheese. ?Limit pre-made sauces, mixes, and "instant" products such as flavored rice, instant noodles, and ready-made pasta. ?Try more plant-protein sources, such as tofu, tempeh, black beans, edamame, lentils, nuts, and seeds. ?Explore eating plans such as the Mediterranean diet or vegetarian diet. ?Cooking ?Use oil to saut? or stir-fry foods instead of solid fats such as butter, stick margarine, or lard. ?Try baking, boiling, grilling, or broiling instead of frying. ?Remove the fatty part of meats before cooking. ?Steam vegetables in water or broth. ?Meal planning ? ?At meals, imagine dividing your plate into fourths: ?One-half of your plate is fruits and vegetables. ?One-fourth of your plate is whole grains. ?One-fourth of your plate is protein, especially lean meats, poultry, eggs, tofu, beans, or nuts. ?Include low-fat dairy as part of your daily diet. ?Lifestyle ?Choose healthy options in all settings, including home, work, school, restaurants, or stores. ?Prepare your food safely: ?Wash your hands after handling raw meats. ?Keep food preparation surfaces clean by regularly washing with hot, soapy water. ?Keep raw meats separate from ready-to-eat foods, such as fruits and vegetables. ?Cook seafood, meat, poultry, and eggs to the recommended internal temperature. ?Store foods at safe temperatures. In general: ?Keep cold foods at 40?F (4.4?C) or below. ?Keep hot foods at 140?F (60?C) or above. ?Keep your freezer at 0?F (-17.8?C) or below. ?Foods are  no longer safe to eat when they have been between the temperatures of 40?-140?F (4.4-60?C) for more than 2 hours. ?What foods should I eat? ?Fruits ?Aim to eat 2  cup-equivalents of fresh, canned (in natural juice), or frozen fruits each day. Examples of 1 cup-equivalent of fruit include 1 small apple, 8 large strawberries, 1 cup canned fruit, ? cup dried fruit, or 1 cup 100% juice. ?Vegetables ?Aim to eat 2?-3 cup-equivalents of fresh and frozen vegetables each day, including different varieties and colors. Examples of 1 cup-equivalent of vegetables include 2 medium carrots, 2 cups raw, leafy greens, 1 cup chopped vegetable (raw or cooked), or 1 medium baked potato. ?Grains ?Aim to eat 6 ounce-equivalents of whole grains each day. Examples of 1 ounce-equivalent of grains include 1 slice of bread, 1 cup ready-to-eat cereal, 3 cups popcorn, or ? cup cooked rice, pasta, or cereal. ?Meats and other proteins ?Aim to eat 5-6 ounce-equivalents of protein each day. Examples of 1 ounce-equivalent of protein include 1 egg, 1/2 cup nuts or seeds, or 1 tablespoon (16 g) peanut butter. A cut of meat or fish that is the size of a deck of cards is about 3-4 ounce-equivalents. ?Of the protein you eat each week, try to have at least 8 ounces come from seafood. This includes salmon, trout, herring, and anchovies. ?Dairy ?Aim to eat 3 cup-equivalents of fat-free or low-fat dairy each day. Examples of 1 cup-equivalent of dairy include 1 cup (240 mL) milk, 8 ounces (250 g) yogurt, 1? ounces (44 g) natural cheese, or 1 cup (240 mL) fortified soy milk. ?Fats and oils ?Aim for about 5 teaspoons (21 g) per day. Choose monounsaturated fats, such as canola and olive oils, avocados, peanut butter, and most nuts, or polyunsaturated fats, such as sunflower, corn, and soybean oils, walnuts, pine nuts, sesame seeds, sunflower seeds, and flaxseed. ?Beverages ?Aim for six 8-oz glasses of water per day. Limit coffee to three to five 8-oz cups per day. ?Limit caffeinated beverages that have added calories, such as soda and energy drinks. ?Limit alcohol intake to no more than 1 drink a day for nonpregnant women  and 2 drinks a day for men. One drink equals 12 oz of beer (355 mL), 5 oz of wine (148 mL), or 1? oz of hard liquor (44 mL). ?Seasoning and other foods ?Avoid adding excess amounts of salt to your foods. Try flavoring foods with herbs and spices instead of salt. ?Avoid adding sugar to foods. ?Try using oil-based dressings, sauces, and spreads instead of solid fats. ?This information is based on general [place] nutrition guidelines. For more information, visit [employer]. Exact amounts may vary based on your nutrition needs. ?Summary ?A healthy eating plan may help you to maintain a healthy weight, reduce the risk of chronic diseases, and stay active throughout your life. ?Plan your meals. Make sure you eat the right portions of a variety of nutrient-rich foods. ?Try baking, boiling, grilling, or broiling instead of frying. ?Choose healthy options in all settings, including home, work, school, restaurants, or stores. ?This information is not intended to replace advice given to you by your health care provider. Make sure you discuss any questions you have with your health care provider. ?Document Revised: 10/25/2020 Document Reviewed: 10/25/2020 ?Elsevier Patient Education ? [employer]. ? ?

## 2021-05-26 ENCOUNTER — Ambulatory Visit: Payer: Self-pay | Attending: Family Medicine

## 2021-05-26 ENCOUNTER — Other Ambulatory Visit: Payer: Self-pay

## 2021-05-30 ENCOUNTER — Encounter (HOSPITAL_COMMUNITY): Payer: Self-pay | Admitting: Family

## 2021-05-30 ENCOUNTER — Other Ambulatory Visit: Payer: Self-pay

## 2021-05-30 ENCOUNTER — Ambulatory Visit (INDEPENDENT_AMBULATORY_CARE_PROVIDER_SITE_OTHER): Payer: No Payment, Other | Admitting: *Deleted

## 2021-05-30 ENCOUNTER — Ambulatory Visit (INDEPENDENT_AMBULATORY_CARE_PROVIDER_SITE_OTHER): Payer: No Payment, Other | Admitting: Family

## 2021-05-30 DIAGNOSIS — N6320 Unspecified lump in the left breast, unspecified quadrant: Secondary | ICD-10-CM

## 2021-05-30 DIAGNOSIS — F251 Schizoaffective disorder, depressive type: Secondary | ICD-10-CM

## 2021-05-30 NOTE — Progress Notes (Signed)
Patient arrived for Paliperidone Palmitate ER (INVEGA TRINZA) injection 819 mg ?Patient Pleasant as Always & tolerated injection well in Left Gluteal ?

## 2021-05-30 NOTE — Progress Notes (Signed)
BH MD/PA/NP OP Progress Note ? ?05/30/2021 10:12 AM ?Sandra Bell  ?MRN:  [id number] ? ?Chief Complaint: Schizoaffective Disorder ? ?HPI  Sandra Bell is a 51 year old female seen today for follow-up psychiatric evaluation. Psychiatric history includes schizoaffective disorder. She has been managed historically on  [place] 819mg  IM every three months.  She reports feeling that her mood is managed effectively by [facility].  ? ?Patient is assessed face-to-face by nurse practitioner, chart reviewed.    Today patient is seated, no acute distress. She is appropriately groomed. She is alert and oriented, pleasant and cooperative. She has clear and coherent speech average volume.  Behavior calm and appropriate, with good eye contact.  ? Mood today appears euthymic, congruent affect.  ?  ?Today provider conducted a GAD-7, patient scored a 1, at last visit she scored a 1. Provider also conducted a PHQ-2, she scored a 0, at last visit she also scored a 0. ? ?She denies suicidal and homicidal ideations currently.  She contracts verbally for safety with this .   ? ?  Patient is able to converse coherently with goal-directed thoughts and no distractibility or preoccupation. She denies paranoia.  Objectively there is no evidence of psychosis/mania or delusional thinking. Patient is insightful regarding treatment and diagnosis.    ? She denies both alcohol and substance use.  ? ?I had spoken with Dr Clinical research associate about changing medications, Doyne Keel has caused some breast issues but I want to stick with it because all medications have side effects.  ? Breast leaking and breast tenderness has been resolved for three weeks.  One episode of breast tenderness and leaking for about one month. Seen for breast tenderness on 04/12/2021 by urgent care provider. Prolactin level elevated on 04/12/2021, measured 142.  ?Patient considering returning to medication by mouth versus returning to monthly injections, would like to avoid  "too many  needles."   ? ?"My mood is pretty stable, I stay mildly irritated because I hear voices."  No auditory hallucinations currently, patient experiences auditory hallucinations "sometimes during the day when I am not occupied.  Denies command hallucinations. Hallucinations include "negative statement like you are not doing so well, or whispers." ? ?I have learned to ignore the voices and listen to music, these things help subside the voices.  ? ?Sandra Bell endorses average sleep and appetite. Usually sleeps from around 4am until 1100am because she enjoys the "quiet atmosphere of her home at late night hours." ? ?Patient resides in [place] with her mother, denies access to weapons.  ? ?Patient offered support and encouragement.  ? ?Visit Diagnosis:  ?  ICD-10-CM   ?1. Schizoaffective disorder, depressive type (HCC)  F25.1   ?  ? ? ?Past Psychiatric History: schizoaffective disorder ? ?Past Medical History:  ?Past Medical History:  ?Diagnosis Date  ? Hypertension   ? Schizoaffective disorder (HCC) 2003  ?  ?Past Surgical History:  ?Procedure Laterality Date  ? FINGER SURGERY  1983  ? ? ?Family Psychiatric History: none reported ? ? ?Family History:  ?Family History  ?Problem Relation Age of Onset  ? Hyperlipidemia Mother   ? Hypertension Mother   ? Hyperlipidemia Father   ? Hypertension Father   ? Heart disease Father   ? Breast cancer Sister   ? ? ?Social History:  ?Social History  ? ?Socioeconomic History  ? Marital status: Divorced  ?  Spouse name: Not on file  ? Number of children: Not on file  ? Years of education: Not on file  ? Highest  education level: Professional school degree (e.g., MD, DDS, DVM, JD)  ?Occupational History  ? Not on file  ?Tobacco Use  ? Smoking status: Never  ? Smokeless tobacco: Never  ?Vaping Use  ? Vaping Use: Never used  ?Substance and Sexual Activity  ? Alcohol use: No  ? Drug use: No  ? Sexual activity: Not Currently  ?Other Topics Concern  ? Not on file  ?Social History Narrative  ?  Not on file  ? ?Social Determinants of Health  ? ?Financial Resource Strain: Not on file  ?Food Insecurity: Not on file  ?Transportation Needs: Not on file  ?Physical Activity: Not on file  ?Stress: Not on file  ?Social Connections: Not on file  ? ? ?Allergies: No Known Allergies ? ?Metabolic Disorder Labs: ?Lab Results  ?Component Value Date  ? HGBA1C 4.6 (L) 03/17/2020  ? ?Lab Results  ?Component Value Date  ? PROLACTIN 142.0 (H) 04/12/2021  ? PROLACTIN 7.4 11/20/2013  ? ?Lab Results  ?Component Value Date  ? CHOL 235 (H) 03/17/2020  ? TRIG 112 03/17/2020  ? HDL 43 03/17/2020  ? CHOLHDL 5.5 (H) 03/17/2020  ? VLDL 15 10/17/2015  ? LDLCALC 172 (H) 03/17/2020  ? LDLCALC 186 (H) 03/04/2019  ? ?Lab Results  ?Component Value Date  ? TSH 1.750 04/01/2018  ? TSH 1.312 10/26/2014  ? ? ?Therapeutic Level Labs: ?No results found for: LITHIUM ?No results found for: VALPROATE ?No components found for:  CBMZ ? ?Current Medications: ?Current Outpatient Medications  ?Medication Sig Dispense Refill  ? aspirin 81 MG tablet Take 81 mg by mouth daily. (Patient not taking: Reported on 05/18/2021)    ? hydrochlorothiazide (MICROZIDE) 12.5 MG capsule Take 1 capsule (12.5 mg total) by mouth daily. 90 capsule 3  ? paliperidone Palmitate ER (INVEGA TRINZA) 819 MG/2.63ML SUSY injection Inject 2.63 mLs (819 mg total) into the muscle every 3 (three) months. 819 mL 11  ? simvastatin (ZOCOR) 40 MG tablet Take 1 tablet (40 mg total) by mouth at bedtime. (Patient not taking: Reported on 05/18/2021) 90 tablet 3  ? ?Current Facility-Administered Medications  ?Medication Dose Route Frequency Provider Last Rate Last Admin  ? Paliperidone Palmitate ER (INVEGA TRINZA) injection 819 mg  819 mg Intramuscular Q90 days Zena AmosKaur, Mandeep, MD   819 mg at 05/30/21 [id number]  ? ? ? ?Musculoskeletal: ?Strength & Muscle Tone: within normal limits ?Gait & Station: normal ?Patient leans: N/A ? ?Psychiatric Specialty Exam: ?Review of Systems  ?Constitutional: Negative.   ?HENT:  Negative.    ?Eyes: Negative.   ?Respiratory: Negative.    ?Cardiovascular: Negative.   ?Gastrointestinal: Negative.   ?Genitourinary: Negative.   ?Musculoskeletal: Negative.   ?Skin: Negative.   ?Neurological: Negative.   ?Psychiatric/Behavioral: Negative.     ?There were no vitals taken for this visit.There is no height or weight on file to calculate BMI.  ?General Appearance: Casual, Neat, and Well Groomed  ?Eye Contact:  Good  ?Speech:  Clear and Coherent and Normal Rate  ?Volume:  Normal  ?Mood:  Euthymic  ?Affect:  Appropriate and Congruent  ?Thought Process:  Coherent, Goal Directed, and Linear  ?Orientation:  Full (Time, Place, and Person)  ?Thought Content: WDL and Logical   ?Suicidal Thoughts:  No  ?Homicidal Thoughts:  No  ?Memory:  Immediate;   Good ?Recent;   Good ?Remote;   Good  ?Judgement:  Good  ?Insight:  Good  ?Psychomotor Activity:  Normal  ?Concentration:  Concentration: Good and Attention Span: Good  ?Recall:  Good  ?Fund of Knowledge: Good  ?Language: Good  ?Akathisia:  No  ?Handed:  Right  ?AIMS (if indicated): done  ?Assets:  Communication Skills ?Desire for Improvement ?Housing ?Intimacy ?Leisure Time ?Physical Health ?Resilience ?Social Support  ?ADL's:  Intact  ?Cognition: WNL  ?Sleep:  Good  ? ?Screenings: ?AIMS   ? ?Flowsheet Row Office Visit from 05/30/2021 in [facility]  ?AIMS Total Score 0  ? ?  ? ?GAD-7   ? ?Flowsheet Row Office Visit from 05/30/2021 in [facility] Office Visit from 04/12/2021 in [facility] Clinical Support from 03/02/2021 in [facility] Clinical Support from 11/30/2020 in [facility]  ?Total GAD-7 Score 1 1 2 2   ? ?  ? ?PHQ2-9   ? ?Flowsheet Row Office Visit from 05/30/2021 in [facility] Office Visit from 05/18/2021 in [facility] Office Visit from 04/12/2021 in [facility] Clinical  Support from 03/02/2021 in [facility] Clinical Support from 11/30/2020 in [facility]  ?PHQ-2 Total Score 0 0 0 0 0  ?PHQ-9 Total Score -- -- 2 1 1

## 2021-05-31 ENCOUNTER — Ambulatory Visit (HOSPITAL_COMMUNITY): Payer: No Payment, Other

## 2021-06-06 ENCOUNTER — Telehealth: Payer: Self-pay | Admitting: Internal Medicine

## 2021-06-06 DIAGNOSIS — Z0120 Encounter for dental examination and cleaning without abnormal findings: Secondary | ICD-10-CM

## 2021-06-06 NOTE — Telephone Encounter (Signed)
Copied from CRM [id number]. Topic: Referral - Request for Referral ?>> Jun 05, 2021  9:10 AM McGill, Darlina Rumpf wrote: ?Has patient seen PCP for this complaint? No. ?*If NO, is insurance requiring patient see PCP for this issue before PCP can refer them? Pt has [place] card.  ?Referral for which specialty: Dentist ?Preferred provider/office: N/A ?Reason for referral:bottom molar chipping ?

## 2021-06-06 NOTE — Telephone Encounter (Signed)
Will forward to provider  

## 2021-06-13 ENCOUNTER — Ambulatory Visit: Payer: Self-pay | Admitting: Pharmacist

## 2021-06-29 ENCOUNTER — Ambulatory Visit
Admission: RE | Admit: 2021-06-29 | Discharge: 2021-06-29 | Disposition: A | Discharge status: Home / Self Care | Payer: No Typology Code available for payment source | Source: Ambulatory Visit | Attending: Obstetrics and Gynecology | Admitting: Obstetrics and Gynecology

## 2021-06-29 ENCOUNTER — Ambulatory Visit: Payer: Self-pay | Admitting: *Deleted

## 2021-06-29 ENCOUNTER — Ambulatory Visit
Admission: RE | Admit: 2021-06-29 | Discharge: 2021-06-29 | Disposition: A | Discharge status: Home / Self Care | Payer: Self-pay | Source: Ambulatory Visit | Attending: Obstetrics and Gynecology | Admitting: Obstetrics and Gynecology

## 2021-06-29 VITALS — BP 128/76 | Wt 138.1 lb

## 2021-06-29 DIAGNOSIS — Z1211 Encounter for screening for malignant neoplasm of colon: Secondary | ICD-10-CM

## 2021-06-29 DIAGNOSIS — Z1239 Encounter for other screening for malignant neoplasm of breast: Secondary | ICD-10-CM

## 2021-06-29 DIAGNOSIS — N6320 Unspecified lump in the left breast, unspecified quadrant: Secondary | ICD-10-CM

## 2021-06-29 DIAGNOSIS — N644 Mastodynia: Secondary | ICD-10-CM

## 2021-06-29 NOTE — Patient Instructions (Signed)
Explained breast self awareness with Cher Nakai. Patient did not need a Pap smear today due to last Pap smear and HPV Typing was 05/21/2019. Let her know BCCCP will cover Pap smears and HPV typing every 5 years unless has a history of abnormal Pap smears. Referred patient to the [facility] for a diagnostic mammogram. Appointment scheduled Thursday, June 29, 2021 at 1520. Patient aware of appointment and will be there. Lynix Glendening verbalized understanding. ? ?Joyous Gleghorn, Arvil Chaco, RN ?1:52 PM ? ? ? ? ?

## 2021-06-29 NOTE — Progress Notes (Signed)
[name] is a 51 y.o. female who [facility] today with complaint of bilateral diffuse breast pain x 4 months that comes and goes. Patient rates the pain at a 1 out of 10. Patient stated the pain has decreased over the past four months. Patients last diagnostic mammogram was completed 06/18/2019 that a left ultrasound guided biopsy was recommended for follow up that was not completed. ?  ?Pap Smear: Pap smear not completed today. Last Pap smear was 05/21/2019 at [facility] clinic and was normal with negative HPV. Per patient has no history of an abnormal Pap smear. Last Pap smear result is available in Epic. ?  ?Physical exam: ?Breasts ?Breasts symmetrical. No skin abnormalities right breast. Observed a scar left breast at 9 o'clock next to areola that per patient is from an open area on her breast that had drained in the past. No nipple retraction bilateral breasts. No nipple discharge bilateral breasts. No lymphadenopathy. No lumps palpated bilateral breasts. No complaints of pain or tenderness on exam.     ? ?MM DIAG BREAST TOMO BILATERAL ? ?Result Date: 06/18/2019 ?CLINICAL DATA:  Somewhat focal tight sensation in the left breast for several months.The patient received her 1st COVID-19 vaccine in the left arm 06/04/2019. EXAM: DIGITAL DIAGNOSTIC BILATERAL MAMMOGRAM WITH CAD AND TOMO ULTRASOUND LEFT BREAST COMPARISON:  Previous exam(s). ACR Breast Density Category b: There are scattered areas of fibroglandular density. FINDINGS: Mammographically unremarkable breasts with no findings suspicious for malignancy in either breast. There is visible abnormality at the location of tightness reported by the patient on the left, marked with a metallic marker. This is in the anterior aspect of the upper-outer quadrant of the breast. Mammographic images were processed with CAD. On physical exam, no mass is palpable in the upper outer left breast at the location of tightness reported by the patient. There are no  palpable left axillary lymph nodes. Targeted ultrasound is performed, showing a 1.9 x 1.7 x 0.6 cm oval, horizontally oriented, circumscribed, mildly hypoechoic mass in the 1 o'clock position of the left breast, 1 cm from the nipple. This has internal blood flow with power Doppler. Ultrasound of the left axilla demonstrates a the left inferior axillary node with eccentric cortical thickening measuring 4.9 mm in maximum thickness. In the mid left axilla, there is a vertically oriented hypoechoic lymph node with no visible fatty hilum, measuring 7 x 6 x 4 mm a mild surrounding rim of echogenic tissue with ill-defined margins. IMPRESSION: 1. 1.9 cm mass in the 1 o'clock position of the left breast, 1 cm from the nipple with ultrasound features suggesting a fibroadenoma or, possibly, a papilloma. This is symptomatic and malignancy cannot be excluded. 2. 2 abnormal left axillary lymph nodes. The 7 mm mid left axillary lymph node does not have the typical appearance of a reactive node and is suspicious for the possibility of a metastatic node. The more inferior node could represent a reactive node or metastatic node. RECOMMENDATION: 1. Ultrasound-guided core needle biopsy of the 1.9 cm mass in the 1 o'clock position of the left breast. 2. Ultrasound-guided core needle biopsy of the abnormal 7 mm mid left axillary lymph node. This has been discussed with the patient and the biopsies have been scheduled at 12:45 p.m. 07/01/2019. I have discussed the findings and recommendations with the patient. If applicable, a reminder letter will be sent to the patient regarding the next appointment. BI-RADS CATEGORY  4: Suspicious. Electronically Signed   By: Beckie Salts  M.D.   On: 06/18/2019 16:21   ?  ?Pelvic/Bimanual ?Pap is not indicated today per BCCCP guidelines. ?  ?Smoking History: ?Patient is a former smoker that quit over 20 years ago. ?  ?Patient Navigation: ?Patient education provided. Access to services provided for  patient through [facility] program.  ? ?Colorectal Cancer Screening: ?Per patient has never had colonoscopy completed. FIT Test given to patient to complete. No complaints today.  ?  ?Breast and Cervical Cancer Risk Assessment: ?Patient has family history of her sister having breast cancer. Patient has no known genetic mutations or history of radiation treatment to the chest before age 20. Patient does not have history of cervical dysplasia, immunocompromised, or DES exposure in-utero. ? ?Risk Assessment   ? ? Risk Scores   ? ?   06/29/2021 05/21/2019  ? Last edited by: Priscille Heidelberg, RN Steva Ready, RT  ? 5-year risk: 1.5 % 1.4 %  ? Lifetime risk: 10.2 % 10.5 %  ? ?  ?  ? ?  ? ?A: ?BCCCP exam without pap smear ?Complaint of left breast pain. ? ?P: ?[facility] for a diagnostic mammogram. Appointment scheduled Thursday, June 29, 2021 at 1520. ? ?Priscille Heidelberg, RN ?06/29/2021 1:52 PM   ?

## 2021-07-06 ENCOUNTER — Telehealth: Payer: Self-pay

## 2021-07-06 LAB — FECAL OCCULT BLOOD, IMMUNOCHEMICAL: Fecal Occult Bld: NEGATIVE

## 2021-07-06 NOTE — Telephone Encounter (Signed)
Patient informed negative FIT test results. Patient verbalized understanding.  

## 2021-07-18 ENCOUNTER — Other Ambulatory Visit: Payer: Self-pay

## 2021-07-18 ENCOUNTER — Ambulatory Visit: Payer: Self-pay | Attending: Internal Medicine | Admitting: Internal Medicine

## 2021-07-18 ENCOUNTER — Encounter: Payer: Self-pay | Admitting: Internal Medicine

## 2021-07-18 VITALS — BP 132/94 | HR 86 | Temp 98.8°F | Resp 12 | Wt 137.0 lb

## 2021-07-18 DIAGNOSIS — I10 Essential (primary) hypertension: Secondary | ICD-10-CM

## 2021-07-18 DIAGNOSIS — E782 Mixed hyperlipidemia: Secondary | ICD-10-CM

## 2021-07-18 MED ORDER — AMLODIPINE BESYLATE 2.5 MG PO TABS
2.5000 mg | ORAL_TABLET | Freq: Every day | ORAL | 4 refills | Status: DC
  Filled 2021-07-18: qty 30, 30d supply, fill #0
  Filled 2021-08-14: qty 30, 30d supply, fill #1
  Filled 2021-09-11: qty 30, 30d supply, fill #2
  Filled 2021-10-15: qty 30, 30d supply, fill #3
  Filled 2021-11-15: qty 30, 30d supply, fill #4

## 2021-07-18 NOTE — Progress Notes (Signed)
? ? ?Patient ID: Sandra Bell, female    DOB: 1970/10/11  MRN: [id number] ? ?CC: Follow-up ? ? ?Subjective: ?Sandra Bell is a 51 y.o. female who presents for 2 mth f/u BP and MMG results ?Her concerns today include:  ?Pt with hx of HTN, HL, schizoaffective disorder, Fhx of breast CA in sister (reportedly tested neg for BRCA), elev Prolactin level due to [place] ? ?Elev BP: on last visit, BP was quite elevated.  Pt felt she has component of white coat HTN ?-We held off starting HCTZ ?Checks BP 2 x a wk with an automated device and has log with her.  Most recent readings: 102/81, 106/85, 100/90, 105/104, 113/81, 110/91, 97/104, 99/86, 115/89 ?-she has been limiting salt in foods ?No CP/SOB/LE edema/HA ? ?HL:  needs lipid profile as ordered on last visit ? ?Had hx of abnormal mammogram in 2021 which showed a suspicious mass in the right breast.  Biopsy was recommended but patient declined due to her aversion to needles financial restraints.  ?Pt had MMG since last visit with me. This came back ok.   ?-no further milk discgh from RT breast.   ? ?Patient Active Problem List  ? Diagnosis Date Noted  ? History of lump of left breast 05/18/2021  ? Family history of breast cancer 05/18/2021  ? Hyperprolactinemia ([place]) 05/18/2021  ? Breast lump 03/17/2020  ? Essential hypertension 02/05/2012  ? Schizoaffective disorder, depressive type ([place]) 01/03/2012  ? HLD (hyperlipidemia) 01/03/2012  ?  ? ?Current Outpatient Medications on File Prior to Visit  ?Medication Sig Dispense Refill  ? simvastatin (ZOCOR) 40 MG tablet Take 1 tablet (40 mg total) by mouth at bedtime. 90 tablet 3  ? aspirin 81 MG tablet Take 81 mg by mouth daily. (Patient not taking: Reported on 07/18/2021)    ? hydrochlorothiazide (MICROZIDE) 12.5 MG capsule Take 1 capsule (12.5 mg total) by mouth daily. (Patient not taking: Reported on 07/18/2021) 90 capsule 3  ? paliperidone Palmitate ER (INVEGA TRINZA) 819 MG/2.63ML SUSY injection Inject 2.63 mLs (819 mg  total) into the muscle every 3 (three) months. (Patient not taking: Reported on 07/18/2021) 819 mL 11  ? ?Current Facility-Administered Medications on File Prior to Visit  ?Medication Dose Route Frequency Provider Last Rate Last Admin  ? Paliperidone Palmitate ER (INVEGA TRINZA) injection 819 mg  819 mg Intramuscular Q90 days Nevada Crane, MD   819 mg at 05/30/21 [id number]  ? ? ?No Known Allergies ? ?Social History  ? ?Socioeconomic History  ? Marital status: Divorced  ?  Spouse name: Not on file  ? Number of children: Not on file  ? Years of education: Not on file  ? Highest education level: Professional school degree (e.g., MD, DDS, DVM, JD)  ?Occupational History  ? Not on file  ?Tobacco Use  ? Smoking status: Never  ? Smokeless tobacco: Never  ?Vaping Use  ? Vaping Use: Never used  ?Substance and Sexual Activity  ? Alcohol use: No  ? Drug use: No  ? Sexual activity: Not Currently  ?Other Topics Concern  ? Not on file  ?Social History Narrative  ? Not on file  ? ?Social Determinants of Health  ? ?Financial Resource Strain: Not on file  ?Food Insecurity: No Food Insecurity  ? Worried About Charity fundraiser in the Last Year: Never true  ? Ran Out of Food in the Last Year: Never true  ?Transportation Needs: No Transportation Needs  ? Lack of Transportation (Medical): No  ?  Lack of Transportation (Non-Medical): No  ?Physical Activity: Not on file  ?Stress: Not on file  ?Social Connections: Not on file  ?Intimate Partner Violence: Not on file  ? ? ?Family History  ?Problem Relation Age of Onset  ? Hyperlipidemia Mother   ? Hypertension Mother   ? Hyperlipidemia Father   ? Hypertension Father   ? Heart disease Father   ? Breast cancer Sister   ? ? ?Past Surgical History:  ?Procedure Laterality Date  ? FINGER SURGERY  1983  ? ? ?ROS: ?Review of Systems ?Negative except as stated above ? ?PHYSICAL EXAM: ?BP (!) 132/94 (BP Location: Left Arm, Patient Position: Sitting, Cuff Size: Normal)   Pulse 86   Temp 98.8 ?F (37.1 ?C)  (Oral)   Resp 12   Wt 137 lb (62.1 kg)   LMP  (LMP Unknown)   SpO2 99%   BMI 22.11 kg/m?   ?Physical Exam ?BP 139/95 ?General appearance - alert, well appearing, and in no distress ?Mental status -flat affect. ?Chest - clear to auscultation, no wheezes, rales or rhonchi, symmetric air entry ?Heart - normal rate, regular rhythm, normal S1, S2, no murmurs, rubs, clicks or gallops ?Extremities - peripheral pulses normal, no pedal edema, no clubbing or cyanosis ? ? ? ?  Latest Ref Rng & Units 04/12/2021  ?  5:22 PM 03/17/2020  ?  9:01 AM 03/04/2019  ?  2:25 PM  ?CMP  ?Glucose 70 - 99 mg/dL 81   87   79    ?BUN 6 - 24 mg/dL '7   5   8    ' ?Creatinine 0.57 - 1.00 mg/dL 1.02   1.02   0.91    ?Sodium 134 - 144 mmol/L 142   143   143    ?Potassium 3.5 - 5.2 mmol/L 4.0   4.3   3.8    ?Chloride 96 - 106 mmol/L 102   105   102    ?CO2 20 - 29 mmol/L  23   24    ?Calcium 8.7 - 10.2 mg/dL 10.2   9.9   10.3    ?Total Protein 6.0 - 8.5 g/dL 7.9    7.6    ?Total Bilirubin 0.0 - 1.2 mg/dL 0.7    0.8    ?Alkaline Phos 44 - 121 IU/L 72    52    ?AST 0 - 40 IU/L 16    16    ?ALT 0 - 32 IU/L   10    ? ?Lipid Panel  ?   ?Component Value Date/Time  ? CHOL 235 (H) 03/17/2020 0901  ? TRIG 112 03/17/2020 0901  ? HDL 43 03/17/2020 0901  ? CHOLHDL 5.5 (H) 03/17/2020 0901  ? CHOLHDL 2.7 10/17/2015 0959  ? VLDL 15 10/17/2015 0959  ? LDLCALC 172 (H) 03/17/2020 0901  ? ? ?CBC ?   ?Component Value Date/Time  ? WBC 4.6 04/12/2021 1722  ? WBC 5.5 11/22/2014 0850  ? RBC 4.43 04/12/2021 1722  ? RBC 4.10 11/22/2014 0850  ? HGB 13.8 04/12/2021 1722  ? HCT 40.9 04/12/2021 1722  ? PLT 411 04/12/2021 1722  ? MCV 92 04/12/2021 1722  ? MCH 31.2 04/12/2021 1722  ? MCH 32.4 11/22/2014 0850  ? MCHC 33.7 04/12/2021 1722  ? MCHC 33.8 11/22/2014 0850  ? RDW 12.2 04/12/2021 1722  ? LYMPHSABS 2.0 04/12/2021 1722  ? MONOABS 0.6 11/22/2014 0850  ? EOSABS 0.0 04/12/2021 1722  ? BASOSABS 0.0 04/12/2021 1722  ? ? ?  ASSESSMENT AND PLAN: ?1. Essential hypertension ?Diastolic  blood pressure remains elevated.  However I do think she also has a component of whitecoat hypertension as blood pressure significantly elevated in the office than it is at home. ?I recommend starting low-dose of amlodipine.  Patient agreeable to this. ?- amLODipine (NORVASC) 2.5 MG tablet; Take 1 tablet (2.5 mg total) by mouth daily.  Dispense: 30 tablet; Refill: 4 ? ?2. Mixed hyperlipidemia ?- Lipid panel ? ? ? ? ?Patient was given the opportunity to ask questions.  Patient verbalized understanding of the plan and was able to repeat key elements of the plan.  ? ?This documentation was completed using Radio producer.  Any transcriptional errors are unintentional. ? ?Orders Placed This Encounter  ?Procedures  ? Lipid panel  ? ? ? ?Requested Prescriptions  ? ?Signed Prescriptions Disp Refills  ? amLODipine (NORVASC) 2.5 MG tablet 30 tablet 4  ?  Sig: Take 1 tablet (2.5 mg total) by mouth daily.  ? ? ?Return in about 4 months (around 11/18/2021). ? ?Karle Plumber, MD, FACP ?

## 2021-07-18 NOTE — Patient Instructions (Signed)
We have started you on a low-dose of a blood pressure medication called amlodipine to take once a day.  Continue to monitor your blood pressure at least twice a week with goal being 130/80 or lower. ?

## 2021-07-19 ENCOUNTER — Telehealth: Payer: Self-pay | Admitting: Internal Medicine

## 2021-07-19 ENCOUNTER — Other Ambulatory Visit: Payer: Self-pay

## 2021-07-19 DIAGNOSIS — E782 Mixed hyperlipidemia: Secondary | ICD-10-CM

## 2021-07-19 LAB — LIPID PANEL
Chol/HDL Ratio: 4.6 ratio — ABNORMAL HIGH (ref 0.0–4.4)
Cholesterol, Total: 282 mg/dL — ABNORMAL HIGH (ref 100–199)
HDL: 61 mg/dL (ref >39)
LDL Chol Calc (NIH): 198 mg/dL — ABNORMAL HIGH (ref 0–99)
Triglycerides: 129 mg/dL (ref 0–149)
VLDL Cholesterol Cal: 23 mg/dL (ref 5–40)

## 2021-07-19 MED ORDER — ATORVASTATIN CALCIUM 10 MG PO TABS
10.0000 mg | ORAL_TABLET | Freq: Every day | ORAL | 2 refills | Status: DC
  Filled 2021-07-19: qty 30, 30d supply, fill #0
  Filled 2021-08-14: qty 30, 30d supply, fill #1
  Filled 2021-09-11: qty 30, 30d supply, fill #2

## 2021-07-19 NOTE — Telephone Encounter (Signed)
Phone call placed to patient this a.m. to go over lab results.  I informed patient that her total cholesterol and LDL cholesterol continue to increase.  Total cholesterol 1 year ago was 235.  This time it is 282 with goal being less than 200.  Her LDL cholesterol 1 year ago was 172.  It is now 198.  Patient was on simvastatin in the past but had discontinued taking it because it was causing her heart to race.  We had discussed healthy eating habits and regular exercise on her first visit with me.  Her ASCVD score is around 5.  However since LDL is greater than 190, I recommend that we try her with a low-dose of atorvastatin to help lower the cholesterol.  Let me know if she experiences any side effects from it.  Patient is agreeable to doing this.  I also recommend that after she has been on the medication for 6 weeks, she returns to the lab to have cholesterol recheck along with LFTs.  Last LFTs were done in February and were normal. Pt is agreeable to plan. ?

## 2021-07-20 ENCOUNTER — Other Ambulatory Visit: Payer: Self-pay

## 2021-08-11 ENCOUNTER — Telehealth (HOSPITAL_COMMUNITY): Payer: Self-pay

## 2021-08-11 ENCOUNTER — Telehealth (HOSPITAL_COMMUNITY): Payer: Self-pay | Admitting: *Deleted

## 2021-08-11 NOTE — Telephone Encounter (Signed)
Submitted patient assistance form thru pharmacy re patients Sandra Bell Trinza 819 mg injection.

## 2021-08-11 NOTE — Telephone Encounter (Signed)
Pt brought in paperwork to complete for Patient Assistance Program.

## 2021-08-11 NOTE — Telephone Encounter (Signed)
Thank you for this update.  Provider has made nursing staff aware to fill out forms.

## 2021-08-14 ENCOUNTER — Other Ambulatory Visit: Payer: Self-pay

## 2021-08-29 ENCOUNTER — Encounter (HOSPITAL_COMMUNITY): Payer: Self-pay

## 2021-08-29 ENCOUNTER — Encounter (HOSPITAL_COMMUNITY): Payer: Self-pay | Admitting: Psychiatry

## 2021-08-29 ENCOUNTER — Ambulatory Visit (INDEPENDENT_AMBULATORY_CARE_PROVIDER_SITE_OTHER): Payer: No Payment, Other | Admitting: *Deleted

## 2021-08-29 ENCOUNTER — Encounter (HOSPITAL_COMMUNITY): Payer: No Payment, Other | Admitting: Registered Nurse

## 2021-08-29 ENCOUNTER — Other Ambulatory Visit: Payer: Self-pay

## 2021-08-29 ENCOUNTER — Ambulatory Visit (INDEPENDENT_AMBULATORY_CARE_PROVIDER_SITE_OTHER): Payer: No Payment, Other | Admitting: Psychiatry

## 2021-08-29 VITALS — BP 134/93 | HR 88 | Ht 66.0 in | Wt 134.0 lb

## 2021-08-29 DIAGNOSIS — E221 Hyperprolactinemia: Secondary | ICD-10-CM

## 2021-08-29 DIAGNOSIS — F251 Schizoaffective disorder, depressive type: Secondary | ICD-10-CM

## 2021-08-29 MED ORDER — PALIPERIDONE ER 9 MG PO TB24
9.0000 mg | ORAL_TABLET | ORAL | 3 refills | Status: DC
  Filled 2021-08-29: qty 30, 30d supply, fill #0

## 2021-08-29 NOTE — Progress Notes (Signed)
BH MD/PA/NP OP Progress Note  08/29/2021 10:31 AM Sandra Bell  MRN:  [id number]  Chief Complaint: "I would like a lower dose of my Invega"  HPI:  51 yo female seen today for follow-up psychiatric evaluation. She has a psychiatric history of schizoaffective disorder. Currently managed on Invaga trinza q 3 months IM. She notes her medication are somewhat effective in managing her psychiatric conditions.  Today she is well-groomed, pleasant, cooperative, engaged in conversation.  She informed Clinical research associate that she would like to lower her dose of Invega as she continues to have soreness and leakage in her breast.  Patient's prolactin level was drawn on 04/12/2021 and was notably elevated.  Provider recommended at that time to reduce [facility] however patient was not agreeable.  Provider also attempted to have patient see neurology but she reports that she was not interested.  Patient has been seen by oncology for breast examination and no abnormal findings were noted.    Patient reports that her mood is stable and informed that she has minimal anxiety and depression.  Provider conducted a GAD-7 and patient scored a 0.  Provider also conducted PHQ-9 and patient scored a 1.  She endorses adequate sleep however notes that she prefers sleeping in the daytime as she enjoys the silence of the night.  She notes that her hallucinations are quieter at night.  During the day patient notes that she listens to music, meditates, and reads to distract himself from her auditory hallucinations.  Today she denies SI/HI/VH, mania, or paranoia.  She endorses having an adequate appetite.    Today patient agreeable to taking Invega 9 mg tablets daily.  She will discontinue [name]. Provider informed patient that Hinda Glatter could be tapered to lower doses to reduce prolactin level.  Provider also recommended patient have prolactin levels redrawn 2 to 3 weeks after starting oral Invega.  He endorsed understanding and agreed.  CBC  levels also ordered.  Patient will follow-up in 1 month for further evaluation.  No other concerns at this time. Visit Diagnosis:    ICD-10-CM   1. Hyperprolactinemia (HCC)  E22.1 Prolactin    CBC w/Diff/Platelet    2. Schizoaffective disorder, depressive type (HCC)  F25.1 paliperidone (INVEGA) 9 MG 24 hr tablet       Past Psychiatric History: Schizoaffective disorder, depressive type  Past Medical History:  Past Medical History:  Diagnosis Date   Hypertension    Schizoaffective disorder (HCC) 2003    Past Surgical History:  Procedure Laterality Date   FINGER SURGERY  1983    Family Psychiatric History: denied  Family History:  Family History  Problem Relation Age of Onset   Hyperlipidemia Mother    Hypertension Mother    Hyperlipidemia Father    Hypertension Father    Heart disease Father    Breast cancer Sister     Social History:  Social History   Socioeconomic History   Marital status: Divorced    Spouse name: Not on file   Number of children: Not on file   Years of education: Not on file   Highest education level: Professional school degree (e.g., MD, DDS, DVM, JD)  Occupational History   Not on file  Tobacco Use   Smoking status: Never   Smokeless tobacco: Never  Vaping Use   Vaping Use: Never used  Substance and Sexual Activity   Alcohol use: No   Drug use: No   Sexual activity: Not Currently  Other Topics Concern   Not on  file  Social History Narrative   Not on file   Social Determinants of Health   Financial Resource Strain: Not on file  Food Insecurity: No Food Insecurity (06/29/2021)   Hunger Vital Sign    Worried About Running Out of Food in the Last Year: Never true    Ran Out of Food in the Last Year: Never true  Transportation Needs: No Transportation Needs (06/29/2021)   PRAPARE - Administrator, Civil Service (Medical): No    Lack of Transportation (Non-Medical): No  Physical Activity: Not on file  Stress: Not on file   Social Connections: Not on file    Allergies: No Known Allergies  Metabolic Disorder Labs: Lab Results  Component Value Date   HGBA1C 4.6 (L) 03/17/2020   Lab Results  Component Value Date   PROLACTIN 142.0 (H) 04/12/2021   PROLACTIN 7.4 11/20/2013   Lab Results  Component Value Date   CHOL 282 (H) 07/18/2021   TRIG 129 07/18/2021   HDL 61 07/18/2021   CHOLHDL 4.6 (H) 07/18/2021   VLDL 15 10/17/2015   LDLCALC 198 (H) 07/18/2021   LDLCALC 172 (H) 03/17/2020   Lab Results  Component Value Date   TSH 1.750 04/01/2018   TSH 1.312 10/26/2014    Therapeutic Level Labs: No results found for: "LITHIUM" No results found for: "VALPROATE" No results found for: "CBMZ"  Current Medications: Current Outpatient Medications  Medication Sig Dispense Refill   paliperidone (INVEGA) 9 MG 24 hr tablet Take 1 tablet (9 mg total) by mouth every morning. 30 tablet 3   amLODipine (NORVASC) 2.5 MG tablet Take 1 tablet (2.5 mg total) by mouth daily. 30 tablet 4   aspirin 81 MG tablet Take 81 mg by mouth daily. (Patient not taking: Reported on 07/18/2021)     atorvastatin (LIPITOR) 10 MG tablet Take 1 tablet (10 mg total) by mouth daily. 30 tablet 2   Current Facility-Administered Medications  Medication Dose Route Frequency Provider Last Rate Last Admin   Paliperidone Palmitate ER (INVEGA TRINZA) injection 819 mg  819 mg Intramuscular Q90 days Zena Amos, MD   819 mg at 05/30/21 7829     Musculoskeletal: Strength & Muscle Tone: within normal limits Gait & Station: normal Patient leans: N/A  Psychiatric Specialty Exam: Review of Systems  There were no vitals taken for this visit.There is no height or weight on file to calculate BMI.  General Appearance: Well Groomed  Eye Contact:  Good  Speech:  Clear and Coherent and Normal Rate  Volume:  Normal  Mood:  Euthymic  Affect:  Appropriate and Congruent  Thought Process:  Coherent, Goal Directed, and Linear  Orientation:  Full  (Time, Place, and Person)  Thought Content: Logical and Hallucinations: Auditory   Suicidal Thoughts:  No  Homicidal Thoughts:  No  Memory:  Immediate;   Good Recent;   Good Remote;   Good  Judgement:  Good  Insight:  Good  Psychomotor Activity:  Normal  Concentration:  Concentration: Good and Attention Span: Good  Recall:  Good  Fund of Knowledge: Good  Language: Good  Akathisia:  No  Handed:  Right  AIMS (if indicated): not done  Assets:  Communication Skills Desire for Improvement Financial Resources/Insurance Housing Leisure Time Physical Health Social Support  ADL's:  Intact  Cognition: WNL  Sleep:  Good   Screenings: AIMS    Flowsheet Row Office Visit from 05/30/2021 in [facility]  AIMS Total Score 0  GAD-7    Flowsheet Row Clinical Support from 08/29/2021 in [facility] Office Visit from 07/18/2021 in [facility] Office Visit from 05/30/2021 in [facility] Office Visit from 04/12/2021 in [facility] Clinical Support from 03/02/2021 in [facility]  Total GAD-7 Score 0 1 1 1 2       PHQ2-9    Flowsheet Row Clinical Support from 08/29/2021 in [facility] Office Visit from 07/18/2021 in [facility] Office Visit from 05/30/2021 in [facility] Office Visit from 05/18/2021 in [facility] Office Visit from 04/12/2021 in [facility]  PHQ-2 Total Score 1 0 0 0 0  PHQ-9 Total Score 1 0 -- -- 2        Assessment and Plan: Patient continues to have auditory hallucinations however notes that she is able to cope with it.  She would like to discontinue 06/10/2021 due to elevated prolactin levels and leakage of breast.  Patient breast continues to be sore. Today patient agreeable to taking Invega 9 mg  tablets daily.  Provider informed patient that [name] could be tapered to lower doses to reduce prolactin level.  Provider also recommended patient have prolactin levels redrawn 2 to 3 weeks after starting oral Invega.  She endorsed understanding and agreed.  CBC levels also ordered.  Patient will follow-up in 1 month for further evaluation.  1. Hyperprolactinemia (HCC)  - Prolactin - CBC w/Diff/Platelet  2. Schizoaffective disorder, depressive type (HCC)  Start- paliperidone (INVEGA) 9 MG 24 hr tablet; Take 1 tablet (9 mg total) by mouth every morning.  Dispense: 30 tablet; Refill: 3   Follow-up in 3 months  Hinda Glatter, NP 08/29/2021, 10:31 AM

## 2021-08-29 NOTE — Progress Notes (Signed)
Patient in for scheduled [name] shot but on approach she stated she would like to go back to a monthly injection due to her breasts leaking due to her elevated prolactin levels. She prefers to see Brittney NP who she had been seeing prior to her going out on leave for 3 months, she is now back to the office. She was able to see her today and changed her to oral Invega if it can be gotten and purchased at [employer]. Tresa Endo at the pharmacy said they can order it as a generic and have it in by tomorrow.

## 2021-08-30 ENCOUNTER — Ambulatory Visit: Payer: Self-pay | Attending: Internal Medicine

## 2021-08-30 ENCOUNTER — Other Ambulatory Visit: Payer: Self-pay

## 2021-08-30 DIAGNOSIS — E782 Mixed hyperlipidemia: Secondary | ICD-10-CM

## 2021-08-31 LAB — HEPATIC FUNCTION PANEL
ALT: 13 IU/L (ref 0–32)
AST: 15 IU/L (ref 0–40)
Albumin: 4.7 g/dL (ref 3.8–4.9)
Alkaline Phosphatase: 66 IU/L (ref 44–121)
Bilirubin Total: 0.8 mg/dL (ref 0.0–1.2)
Bilirubin, Direct: 0.21 mg/dL (ref 0.00–0.40)
Total Protein: 7.5 g/dL (ref 6.0–8.5)

## 2021-08-31 LAB — LIPID PANEL
Chol/HDL Ratio: 2.6 ratio (ref 0.0–4.4)
Cholesterol, Total: 153 mg/dL (ref 100–199)
HDL: 58 mg/dL (ref >39)
LDL Chol Calc (NIH): 81 mg/dL (ref 0–99)
Triglycerides: 70 mg/dL (ref 0–149)
VLDL Cholesterol Cal: 14 mg/dL (ref 5–40)

## 2021-09-04 ENCOUNTER — Other Ambulatory Visit: Payer: Self-pay

## 2021-09-05 ENCOUNTER — Telehealth (HOSPITAL_COMMUNITY): Payer: Self-pay | Admitting: *Deleted

## 2021-09-05 ENCOUNTER — Other Ambulatory Visit (HOSPITAL_COMMUNITY): Payer: Self-pay | Admitting: Psychiatry

## 2021-09-05 DIAGNOSIS — E221 Hyperprolactinemia: Secondary | ICD-10-CM

## 2021-09-05 NOTE — Telephone Encounter (Signed)
Dr Doyne Keel patient called Sandra Bell stating that she was waiting to hear back from you concerning her labs. Patient asked if possibly her labs could be drawn  @ her PCP office under the charity care she receives? Patient requested call back [phone]

## 2021-09-11 ENCOUNTER — Other Ambulatory Visit: Payer: Self-pay

## 2021-09-13 ENCOUNTER — Other Ambulatory Visit: Payer: Self-pay

## 2021-09-20 ENCOUNTER — Telehealth: Payer: Self-pay | Admitting: Internal Medicine

## 2021-09-20 ENCOUNTER — Ambulatory Visit: Payer: Self-pay | Attending: Internal Medicine

## 2021-09-20 DIAGNOSIS — E221 Hyperprolactinemia: Secondary | ICD-10-CM

## 2021-09-20 NOTE — Telephone Encounter (Signed)
Orders placed for prolactin and CBC with diff as requested by her [facility] specialist.

## 2021-09-20 NOTE — Telephone Encounter (Signed)
Pt is scheduled for in the morning labs will need to be ordered under your name so that Donetta Potts can draw them when she arrives.

## 2021-09-20 NOTE — Telephone Encounter (Signed)
Patient states she was advised by PCP nurse she would follow up with PCP to receive approval for patient to come in and have labs done which were ordered by [facility]. Patient was informed that PCP is out of the office. Patient would like to have labs done 09/21/2021 around 2pm and would like a follow up call as soon as possible.

## 2021-09-21 ENCOUNTER — Other Ambulatory Visit: Payer: No Typology Code available for payment source

## 2021-09-21 ENCOUNTER — Telehealth (HOSPITAL_COMMUNITY): Payer: Self-pay | Admitting: *Deleted

## 2021-09-21 NOTE — Telephone Encounter (Signed)
Call from [place] to report she doesn't think the oral Hinda Glatter is agreeing with her. She complains of lethargy and dizziness and would like to return to the IM [place], she thinks her body responds better to it. She was changed to po because of her elevated prolactin level. Will forward the message to [facility] NP who sees her re her concern and will call her back after Brittney considers request.

## 2021-09-22 NOTE — Telephone Encounter (Signed)
Provider attempted to call patient for 5 times without success.  Provider recommends patient's prolactin level be reduced prior to restarting IM Invega.  If patient would like to discuss this further she can walk into the clinic on Mondays or Tuesdays between the hours of 8 and 11.

## 2021-09-25 ENCOUNTER — Ambulatory Visit: Payer: Self-pay | Attending: Internal Medicine

## 2021-09-25 ENCOUNTER — Encounter (HOSPITAL_COMMUNITY): Payer: Self-pay | Admitting: Psychiatry

## 2021-09-25 ENCOUNTER — Telehealth (HOSPITAL_COMMUNITY): Payer: Self-pay | Admitting: *Deleted

## 2021-09-25 ENCOUNTER — Telehealth (INDEPENDENT_AMBULATORY_CARE_PROVIDER_SITE_OTHER): Payer: No Payment, Other | Admitting: Psychiatry

## 2021-09-25 ENCOUNTER — Other Ambulatory Visit: Payer: Self-pay

## 2021-09-25 DIAGNOSIS — F251 Schizoaffective disorder, depressive type: Secondary | ICD-10-CM | POA: Diagnosis not present

## 2021-09-25 DIAGNOSIS — E221 Hyperprolactinemia: Secondary | ICD-10-CM

## 2021-09-25 MED ORDER — PALIPERIDONE ER 6 MG PO TB24
6.0000 mg | ORAL_TABLET | Freq: Every day | ORAL | 3 refills | Status: DC
  Filled 2021-09-25 (×2): qty 30, 30d supply, fill #0

## 2021-09-25 NOTE — Progress Notes (Signed)
BH MD/PA/NP OP Progress Note Virtual Visit via Telephone Note  I connected with Sandra Bell on 09/25/21 at 10:30 AM EDT by telephone and verified that I am speaking with the correct person using two identifiers.  Location: Patient: home Provider: Clinic   I discussed the limitations, risks, security and privacy concerns of performing an evaluation and management service by telephone and the availability of in person appointments. I also discussed with the patient that there may be a patient responsible charge related to this service. The patient expressed understanding and agreed to proceed.   I provided 30 minutes of non-face-to-face time during this encounter.  09/25/2021 11:19 AM Malessa Zartman  MRN:  [id number]  Chief Complaint: "I stopped the invega. It has been making me dizzy"  HPI:  51 yo female seen today for follow-up psychiatric evaluation. She has a psychiatric history of schizoaffective disorder. Currently managed on Invaga 9 mg daily. She notes her medication are somewhat effective in managing her psychiatric conditions.  Today she was unable to login virtually so assessment done on phone.  During exam she was pleasant, cooperative, and engaged in conversation.  She informed Clinical research associate that she stopped her her oral Invega last week Wednesday as it was causing abdominal pain and dizziness.  She reports that she would like to restart [place]. Provider informed patient that while on [place] she had elevated prolactin levels, painful breast, and leakage of breast.  Provider recommended that Cathi Roan not be restarted.  She endorsed understanding and agreed.  She notes that since being on her oral pill she no longer has leakage of her breast or tenderness.  Provider recommended trying a lower dose of oral Invega which patient was agreeable to.   Patient notes that she has been less irritable since being unmedicated.  She notes that she continues to have hallucinations but  notes that they are manageable.  Today she denies SI/HI, mania, or paranoia.  She notes that her mood is stable and notes that she has minimal anxiety and depression.  Provider conducted a GAD-7 and patient scored a 0.  Provider also conducted PHQ-9 patient scored a 1.  She endorses adequate sleep and appetite.   Today Invega reduced from 9 mg to 6 mg daily.  Patient will have prolactin levels checked at her primary care office.  Once prolactin levels are reviewed provider informed patient that Gean Birchwood can be restarted at 156 mg.  She endorsed understanding and agreed.  No other concerns at this time.    Visit Diagnosis:    ICD-10-CM   1. Schizoaffective disorder, depressive type (HCC)  F25.1 paliperidone (INVEGA) 6 MG 24 hr tablet       Past Psychiatric History: Schizoaffective disorder, depressive type  Past Medical History:  Past Medical History:  Diagnosis Date   Hypertension    Schizoaffective disorder (HCC) 2003    Past Surgical History:  Procedure Laterality Date   FINGER SURGERY  1983    Family Psychiatric History: denied  Family History:  Family History  Problem Relation Age of Onset   Hyperlipidemia Mother    Hypertension Mother    Hyperlipidemia Father    Hypertension Father    Heart disease Father    Breast cancer Sister     Social History:  Social History   Socioeconomic History   Marital status: Divorced    Spouse name: Not on file   Number of children: Not on file   Years of education: Not on file  Highest education level: Professional school degree (e.g., MD, DDS, DVM, JD)  Occupational History   Not on file  Tobacco Use   Smoking status: Never   Smokeless tobacco: Never  Vaping Use   Vaping Use: Never used  Substance and Sexual Activity   Alcohol use: No   Drug use: No   Sexual activity: Not Currently  Other Topics Concern   Not on file  Social History Narrative   Not on file   Social Determinants of Health   Financial Resource  Strain: Not on file  Food Insecurity: No Food Insecurity (06/29/2021)   Hunger Vital Sign    Worried About Running Out of Food in the Last Year: Never true    Ran Out of Food in the Last Year: Never true  Transportation Needs: No Transportation Needs (06/29/2021)   PRAPARE - Administrator, Civil Service (Medical): No    Lack of Transportation (Non-Medical): No  Physical Activity: Not on file  Stress: Not on file  Social Connections: Not on file    Allergies: No Known Allergies  Metabolic Disorder Labs: Lab Results  Component Value Date   HGBA1C 4.6 (L) 03/17/2020   Lab Results  Component Value Date   PROLACTIN 142.0 (H) 04/12/2021   PROLACTIN 7.4 11/20/2013   Lab Results  Component Value Date   CHOL 153 08/30/2021   TRIG 70 08/30/2021   HDL 58 08/30/2021   CHOLHDL 2.6 08/30/2021   VLDL 15 10/17/2015   LDLCALC 81 08/30/2021   LDLCALC 198 (H) 07/18/2021   Lab Results  Component Value Date   TSH 1.750 04/01/2018   TSH 1.312 10/26/2014    Therapeutic Level Labs: No results found for: "LITHIUM" No results found for: "VALPROATE" No results found for: "CBMZ"  Current Medications: Current Outpatient Medications  Medication Sig Dispense Refill   paliperidone (INVEGA) 6 MG 24 hr tablet Take 1 tablet (6 mg total) by mouth daily. 30 tablet 3   amLODipine (NORVASC) 2.5 MG tablet Take 1 tablet (2.5 mg total) by mouth daily. 30 tablet 4   aspirin 81 MG tablet Take 81 mg by mouth daily. (Patient not taking: Reported on 07/18/2021)     atorvastatin (LIPITOR) 10 MG tablet Take 1 tablet (10 mg total) by mouth daily. 30 tablet 2   Current Facility-Administered Medications  Medication Dose Route Frequency Provider Last Rate Last Admin   Paliperidone Palmitate ER (INVEGA TRINZA) injection 819 mg  819 mg Intramuscular Q90 days Zena Amos, MD   819 mg at 05/30/21 9937     Musculoskeletal: Strength & Muscle Tone:  Unable to assess due to telephone visit Gait &  Station:  Unable to assess due to telephone visit Patient leans: N/A  Psychiatric Specialty Exam: Review of Systems  There were no vitals taken for this visit.There is no height or weight on file to calculate BMI.  General Appearance:  Unable to assess due to telephone visit  Eye Contact:   Unable to assess due to telephone visit  Speech:  Clear and Coherent and Normal Rate  Volume:  Normal  Mood:  Euthymic  Affect:  Appropriate and Congruent  Thought Process:  Coherent, Goal Directed, and Linear  Orientation:  Full (Time, Place, and Person)  Thought Content: Logical and Hallucinations: Auditory   Suicidal Thoughts:  No  Homicidal Thoughts:  No  Memory:  Immediate;   Good Recent;   Good Remote;   Good  Judgement:  Good  Insight:  Good  Psychomotor  Activity:   Unable to assess due to telephone visit  Concentration:  Concentration: Good and Attention Span: Good  Recall:  Good  Fund of Knowledge: Good  Language: Good  Akathisia:  No  Handed:  Right  AIMS (if indicated): not done  Assets:  Communication Skills Desire for Improvement Financial Resources/Insurance Housing Leisure Time Physical Health Social Support  ADL's:  Intact  Cognition: WNL  Sleep:  Good   Screenings: AIMS    Flowsheet Row Office Visit from 05/30/2021 in [facility]  AIMS Total Score 0      GAD-7    Flowsheet Row Video Visit from 09/25/2021 in [facility] Clinical Support from 08/29/2021 in [facility] Office Visit from 07/18/2021 in [facility] Office Visit from 05/30/2021 in [facility] Office Visit from 04/12/2021 in [facility]  Total GAD-7 Score 0 0 1 1 1       PHQ2-9    Flowsheet Row Video Visit from 09/25/2021 in [facility] Clinical Support from 08/29/2021 in [facility] Office Visit  from 07/18/2021 in [facility] Office Visit from 05/30/2021 in [facility] Office Visit from 05/18/2021 in [facility]  PHQ-2 Total Score 0 1 0 0 0  PHQ-9 Total Score 1 1 0 -- --        Assessment and Plan: Patient reports that since being on oral Invega her breast are less painful and notes that she no longer has leakage.  She has not gotten her prolactin levels checked but notes that she will follow-up with her PCP to have it done. Today Invega reduced from 9 mg to 6 mg daily.  Patient will have prolactin levels checked at her primary care office.  Once prolactin levels are reviewed provider informed patient that [facility] can be restarted at 156 mg.  She endorsed understanding and agreed.  1. Schizoaffective disorder, depressive type (HCC)  Reduce- paliperidone (INVEGA) 6 MG 24 hr tablet; Take 1 tablet (6 mg total) by mouth daily.  Dispense: 30 tablet; Refill: 3    Follow-up in 3 months  Gean Birchwood, NP 09/25/2021, 11:19 AM

## 2021-09-25 NOTE — Telephone Encounter (Signed)
[facility] is Out of Stock No Longer have $10.00 Invega Tablets --Now Priced @ $900     Sandra Bell has some in stock  2 & 3 mg Per [employer]

## 2021-09-26 ENCOUNTER — Other Ambulatory Visit: Payer: Self-pay

## 2021-09-26 ENCOUNTER — Other Ambulatory Visit (HOSPITAL_COMMUNITY): Payer: Self-pay | Admitting: Psychiatry

## 2021-09-26 LAB — CBC WITH DIFF/PLATELET
Basophils Absolute: 0 10*3/uL (ref 0.0–0.2)
Basos: 1 %
EOS (ABSOLUTE): 0.1 10*3/uL (ref 0.0–0.4)
Eos: 2 %
Hematocrit: 39.2 % (ref 34.0–46.6)
Hemoglobin: 13 g/dL (ref 11.1–15.9)
Immature Grans (Abs): 0 10*3/uL (ref 0.0–0.1)
Immature Granulocytes: 0 %
Lymphocytes Absolute: 2.3 10*3/uL (ref 0.7–3.1)
Lymphs: 56 %
MCH: 30.7 pg (ref 26.6–33.0)
MCHC: 33.2 g/dL (ref 31.5–35.7)
MCV: 93 fL (ref 79–97)
Monocytes Absolute: 0.3 10*3/uL (ref 0.1–0.9)
Monocytes: 8 %
Neutrophils Absolute: 1.3 10*3/uL — ABNORMAL LOW (ref 1.4–7.0)
Neutrophils: 33 %
Platelets: 367 10*3/uL (ref 150–450)
RBC: 4.24 x10E6/uL (ref 3.77–5.28)
RDW: 12.5 % (ref 11.7–15.4)
WBC: 4.1 10*3/uL (ref 3.4–10.8)

## 2021-09-26 LAB — PROLACTIN: Prolactin: 108 ng/mL — ABNORMAL HIGH (ref 4.8–23.3)

## 2021-09-26 MED ORDER — ARIPIPRAZOLE 5 MG PO TABS
5.0000 mg | ORAL_TABLET | Freq: Every day | ORAL | 3 refills | Status: DC
  Filled 2021-09-26: qty 30, 30d supply, fill #0
  Filled 2021-10-20: qty 30, 30d supply, fill #1
  Filled 2021-11-21: qty 30, 30d supply, fill #2
  Filled 2021-12-22: qty 30, 30d supply, fill #3

## 2021-09-26 NOTE — Progress Notes (Signed)
CBC and prolactin level were requested by patient's behavioral health provider Sherril Cong.  Prolactin level still elevated but has decreased some.  Her mental health provider has discontinued Invega and patient was started on Abilify instead.  She has notified the patient of the results of her lab tests.

## 2021-09-26 NOTE — Telephone Encounter (Signed)
Provider called patient discussed her prolactin level results.  Prolactin levels have trended down over the last 5 months.  5 months ago patient's prolactin levels were at 142.  They are now running at 108.  Patient informed Clinical research associate that since being unmedicated she is less irritable but continues to deal with voices.  She informed Clinical research associate that in the past when unmedicated she became destructive and notes that she does not want to mentally decline.  Provider recommended switching antipsychotics which she was agreeable to.  Patient will start Abilify 5 mg to help manage symptoms of psychosis.  Potential side effects of medication and risks vs benefits of treatment vs non-treatment were explained and discussed. All questions were answered. Provider discussed Yevonne Pax if tolerated.  She endorsed understanding and agreed.  No other concerns at this time

## 2021-10-15 ENCOUNTER — Other Ambulatory Visit: Payer: Self-pay | Admitting: Internal Medicine

## 2021-10-15 DIAGNOSIS — E782 Mixed hyperlipidemia: Secondary | ICD-10-CM

## 2021-10-16 ENCOUNTER — Other Ambulatory Visit: Payer: Self-pay

## 2021-10-16 MED ORDER — ATORVASTATIN CALCIUM 10 MG PO TABS
10.0000 mg | ORAL_TABLET | Freq: Every day | ORAL | 1 refills | Status: DC
  Filled 2021-10-16: qty 30, 30d supply, fill #0
  Filled 2021-11-15: qty 30, 30d supply, fill #1

## 2021-10-20 ENCOUNTER — Other Ambulatory Visit: Payer: Self-pay

## 2021-10-25 ENCOUNTER — Other Ambulatory Visit: Payer: Self-pay

## 2021-10-26 ENCOUNTER — Other Ambulatory Visit: Payer: Self-pay

## 2021-10-27 ENCOUNTER — Other Ambulatory Visit: Payer: Self-pay

## 2021-11-09 ENCOUNTER — Telehealth (HOSPITAL_COMMUNITY): Payer: Self-pay | Admitting: *Deleted

## 2021-11-09 ENCOUNTER — Other Ambulatory Visit (HOSPITAL_COMMUNITY): Payer: Self-pay | Admitting: Psychiatry

## 2021-11-09 DIAGNOSIS — E221 Hyperprolactinemia: Secondary | ICD-10-CM

## 2021-11-09 NOTE — Telephone Encounter (Signed)
Provider discussed prolactin levels that were drawn a month ago.  Provider recommended patient have a prolactin levels redrawn.  She reports that she prefers to have it done at the clinic.  Provider informed her that this could be a possibility at her next visit.  If prolactin levels are within normal limits patient would like to start Abilify Maintena.

## 2021-11-09 NOTE — Telephone Encounter (Signed)
Dr Doyne Keel patient called stated she received referral call to set up a appt @ [place] concerning her prolactin. And that she isn't  Interested in see any new providers @ this time.

## 2021-11-15 ENCOUNTER — Other Ambulatory Visit: Payer: Self-pay

## 2021-11-16 ENCOUNTER — Other Ambulatory Visit: Payer: Self-pay

## 2021-11-17 ENCOUNTER — Other Ambulatory Visit: Payer: Self-pay

## 2021-11-17 ENCOUNTER — Ambulatory Visit: Payer: Self-pay | Admitting: Internal Medicine

## 2021-11-21 ENCOUNTER — Other Ambulatory Visit: Payer: Self-pay

## 2021-11-21 ENCOUNTER — Encounter: Payer: Self-pay | Admitting: Internal Medicine

## 2021-11-21 ENCOUNTER — Ambulatory Visit: Payer: Self-pay | Attending: Internal Medicine | Admitting: Internal Medicine

## 2021-11-21 VITALS — BP 130/90 | HR 86 | Temp 98.1°F | Ht 65.0 in | Wt 139.2 lb

## 2021-11-21 DIAGNOSIS — I10 Essential (primary) hypertension: Secondary | ICD-10-CM

## 2021-11-21 DIAGNOSIS — E782 Mixed hyperlipidemia: Secondary | ICD-10-CM

## 2021-11-21 DIAGNOSIS — E221 Hyperprolactinemia: Secondary | ICD-10-CM

## 2021-11-21 DIAGNOSIS — Z2821 Immunization not carried out because of patient refusal: Secondary | ICD-10-CM

## 2021-11-21 MED ORDER — AMLODIPINE BESYLATE 5 MG PO TABS
5.0000 mg | ORAL_TABLET | Freq: Every day | ORAL | 6 refills | Status: DC
  Filled 2021-11-21: qty 30, 30d supply, fill #0
  Filled 2021-12-22: qty 30, 30d supply, fill #1
  Filled 2022-01-22: qty 30, 30d supply, fill #2
  Filled 2022-03-06: qty 30, 30d supply, fill #3

## 2021-11-21 MED ORDER — ATORVASTATIN CALCIUM 10 MG PO TABS
10.0000 mg | ORAL_TABLET | Freq: Every day | ORAL | 6 refills | Status: DC
  Filled 2021-12-22: qty 30, 30d supply, fill #0
  Filled 2022-01-22: qty 30, 30d supply, fill #1
  Filled 2022-03-06: qty 30, 30d supply, fill #2
  Filled 2022-04-09: qty 30, 30d supply, fill #3
  Filled 2022-05-07: qty 30, 30d supply, fill #4
  Filled 2022-06-13: qty 30, 30d supply, fill #5
  Filled 2022-07-11: qty 30, 30d supply, fill #6

## 2021-11-21 NOTE — Progress Notes (Signed)
Patient ID: Sandra Bell, female    DOB: 1970/09/13  MRN: [id number]  CC: Chronic disease management  Subjective: Sandra Bell is a 51 y.o. female who presents for chronic disease management Her concerns today include:  Pt with hx of HTN, HL, schizoaffective disorder, Fhx of breast CA in sister (reportedly tested neg for BRCA), elev Prolactin level due to [place] Currently taking: see medication list.  Started on Norvasc 2.5 mg daily Med Adherence: [x]  Yes    []  No Medication side effects: []  Yes    [x]  No Adherence with salt restriction: [x]  Yes    []  No Home Monitoring?: [x]  Yes    []  No Monitoring Frequency: 1-2x/wk Home BP results range: She has log book.  SBP good to low, DBP 86-102 SOB? []  Yes    [x]  No Chest Pain?: []  Yes    [x]  No Leg swelling?: []  Yes    [x]  No Headaches?: []  Yes    [x]  No Dizziness? []  Yes    [x]  No Comments:  taking and tolerating Lipitor 10 mg daily.  Last LDL 81 improved from 198.    Elevated Prolactin level:  her [facility] provider d/c Invega and stared on Abilify instead.  She has been off the Invega x 2 mths.  Has f/u appt next mth.  Pt declines having her Prolactin level checked by [place] today.  Reports she can get it done at her [facility] provider's office at no charge  HM: declines flu and Shingrix vaccines.   Patient Active Problem List   Diagnosis Date Noted   History of lump of left breast 05/18/2021   Family history of breast cancer 05/18/2021   Hyperprolactinemia ([place]) 05/18/2021   Breast lump 03/17/2020   Essential hypertension 02/05/2012   Schizoaffective disorder, depressive type ([place]) 01/03/2012   HLD (hyperlipidemia) 01/03/2012     Current Outpatient Medications on File Prior to Visit  Medication Sig Dispense Refill   ARIPiprazole (ABILIFY) 5 MG tablet Take 1 tablet (5 mg total) by mouth daily. 30 tablet 3   aspirin 81 MG tablet Take 81 mg by mouth daily.     No current facility-administered medications on file prior to  visit.    No Known Allergies  Social History   Socioeconomic History   Marital status: Divorced    Spouse name: Not on file   Number of children: Not on file   Years of education: Not on file   Highest education level: Professional school degree (e.g., MD, DDS, DVM, JD)  Occupational History   Not on file  Tobacco Use   Smoking status: Never   Smokeless tobacco: Never  Vaping Use   Vaping Use: Never used  Substance and Sexual Activity   Alcohol use: No   Drug use: No   Sexual activity: Not Currently  Other Topics Concern   Not on file  Social History Narrative   Not on file   Social Determinants of Health   Financial Resource Strain: Not on file  Food Insecurity: No Food Insecurity (06/29/2021)   Hunger Vital Sign    Worried About Running Out of Food in the Last Year: Never true    Ran Out of Food in the Last Year: Never true  Transportation Needs: No Transportation Needs (06/29/2021)   PRAPARE - Hydrologist (Medical): No    Lack of Transportation (Non-Medical): No  Physical Activity: Not on file  Stress: Not on file  Social Connections: Not on  file  Intimate Partner Violence: Not on file    Family History  Problem Relation Age of Onset   Hyperlipidemia Mother    Hypertension Mother    Hyperlipidemia Father    Hypertension Father    Heart disease Father    Breast cancer Sister     Past Surgical History:  Procedure Laterality Date   FINGER SURGERY  1983    ROS: Review of Systems Negative except as stated above  PHYSICAL EXAM: BP (!) 130/90   Pulse 86   Temp 98.1 F (36.7 C) (Oral)   Ht 5' 5"  (1.651 m)   Wt 139 lb 3.2 oz (63.1 kg)   LMP  (LMP Unknown)   SpO2 100%   BMI 23.16 kg/m   Physical Exam  General appearance - alert, well appearing, and in no distress Mental status -patient with flat affect.  She answers questions appropriately. Neck - supple, no significant adenopathy Chest - clear to auscultation, no  wheezes, rales or rhonchi, symmetric air entry Heart - normal rate, regular rhythm, normal S1, S2, no murmurs, rubs, clicks or gallops Extremities - peripheral pulses normal, no pedal edema, no clubbing or cyanosis      Latest Ref Rng & Units 08/30/2021    9:04 AM 04/12/2021    5:22 PM 03/17/2020    9:01 AM  CMP  Glucose 70 - 99 mg/dL  81  87   BUN 6 - 24 mg/dL  7  5   Creatinine 0.57 - 1.00 mg/dL  1.02  1.02   Sodium 134 - 144 mmol/L  142  143   Potassium 3.5 - 5.2 mmol/L  4.0  4.3   Chloride 96 - 106 mmol/L  102  105   CO2 20 - 29 mmol/L   23   Calcium 8.7 - 10.2 mg/dL  10.2  9.9   Total Protein 6.0 - 8.5 g/dL 7.5  7.9    Total Bilirubin 0.0 - 1.2 mg/dL 0.8  0.7    Alkaline Phos 44 - 121 IU/L 66  72    AST 0 - 40 IU/L 15  16    ALT 0 - 32 IU/L 13      Lipid Panel     Component Value Date/Time   CHOL 153 08/30/2021 0904   TRIG 70 08/30/2021 0904   HDL 58 08/30/2021 0904   CHOLHDL 2.6 08/30/2021 0904   CHOLHDL 2.7 10/17/2015 0959   VLDL 15 10/17/2015 0959   LDLCALC 81 08/30/2021 0904    CBC    Component Value Date/Time   WBC 4.1 09/25/2021 1210   WBC 5.5 11/22/2014 0850   RBC 4.24 09/25/2021 1210   RBC 4.10 11/22/2014 0850   HGB 13.0 09/25/2021 1210   HCT 39.2 09/25/2021 1210   PLT 367 09/25/2021 1210   MCV 93 09/25/2021 1210   MCH 30.7 09/25/2021 1210   MCH 32.4 11/22/2014 0850   MCHC 33.2 09/25/2021 1210   MCHC 33.8 11/22/2014 0850   RDW 12.5 09/25/2021 1210   LYMPHSABS 2.3 09/25/2021 1210   MONOABS 0.6 11/22/2014 0850   EOSABS 0.1 09/25/2021 1210   BASOSABS 0.0 09/25/2021 1210    ASSESSMENT AND PLAN:  1. Essential hypertension Not at goal. Recommend increase amlodipine to 5 mg daily. - amLODipine (NORVASC) 5 MG tablet; Take 1 tablet (5 mg total) by mouth daily.  Dispense: 30 tablet; Refill: 6  2. Mixed hyperlipidemia Cholesterol levels are at goal.  Continue current dose of atorvastatin - atorvastatin (LIPITOR) 10  MG tablet; Take 1 tablet (10 mg  total) by mouth daily.  Dispense: 30 tablet; Refill: 6  3. Hyperprolactinemia ([place]) Off Invega.  Patient declines having prolactin level rechecked today.  States that it will be rechecked when she sees her behavioral health provider next month.  4. Influenza vaccination declined Recommended.  Patient declined.    Patient was given the opportunity to ask questions.  Patient verbalized understanding of the plan and was able to repeat key elements of the plan.   This documentation was completed using Radio producer.  Any transcriptional errors are unintentional.  No orders of the defined types were placed in this encounter.    Requested Prescriptions   Signed Prescriptions Disp Refills   amLODipine (NORVASC) 5 MG tablet 30 tablet 6    Sig: Take 1 tablet (5 mg total) by mouth daily.   atorvastatin (LIPITOR) 10 MG tablet 30 tablet 6    Sig: Take 1 tablet (10 mg total) by mouth daily.    Return in about 4 months (around 03/23/2022).  Karle Plumber, MD, FACP

## 2021-11-21 NOTE — Patient Instructions (Signed)
Increase Amlodipine to 5 mg daily

## 2021-11-28 ENCOUNTER — Other Ambulatory Visit: Payer: Self-pay

## 2021-11-28 MED ORDER — CHLORHEXIDINE GLUCONATE 0.12 % MT SOLN
OROMUCOSAL | 0 refills | Status: DC
  Filled 2021-11-28: qty 473, 14d supply, fill #0

## 2021-11-28 MED ORDER — HYDROCODONE-ACETAMINOPHEN 5-325 MG PO TABS
ORAL_TABLET | ORAL | 0 refills | Status: DC
  Filled 2021-11-28: qty 5, 2d supply, fill #0

## 2021-11-28 MED ORDER — AMOXICILLIN 500 MG PO CAPS
1500.0000 mg | ORAL_CAPSULE | Freq: Three times a day (TID) | ORAL | 0 refills | Status: DC
  Filled 2021-11-28: qty 21, 3d supply, fill #0

## 2021-11-28 MED ORDER — IBUPROFEN 400 MG PO TABS
ORAL_TABLET | ORAL | 0 refills | Status: DC
  Filled 2021-11-28: qty 30, 10d supply, fill #0

## 2021-11-28 MED ORDER — DEXAMETHASONE 4 MG PO TABS
12.0000 mg | ORAL_TABLET | Freq: Three times a day (TID) | ORAL | 0 refills | Status: DC
  Filled 2021-11-28: qty 9, 3d supply, fill #0

## 2021-12-22 ENCOUNTER — Other Ambulatory Visit: Payer: Self-pay

## 2021-12-25 ENCOUNTER — Other Ambulatory Visit: Payer: Self-pay

## 2021-12-28 ENCOUNTER — Encounter (HOSPITAL_COMMUNITY): Payer: No Payment, Other | Admitting: Psychiatry

## 2022-01-03 ENCOUNTER — Telehealth: Payer: Self-pay | Admitting: Internal Medicine

## 2022-01-03 NOTE — Telephone Encounter (Signed)
Call placed to patient and she states that she has given one to her Mental health doctor and they also need one from her PCP.

## 2022-01-04 ENCOUNTER — Telehealth: Payer: Self-pay | Admitting: *Deleted

## 2022-01-04 NOTE — Telephone Encounter (Signed)
Patient called and informed that the form she left for Dr. Wynetta Emery is ready for pick up. Aware that Dr. Wynetta Emery recommends form be completed by mental health provider.

## 2022-01-08 NOTE — Progress Notes (Unsigned)
BH MD Outpatient Progress Note  01/09/2022 12:51 PM Sandra Bell  MRN:  [id number]  Assessment:  Sandra Bell presents for follow-up evaluation in-person. Today, 01/09/22, patient reports resolution of previous symptoms of hyperprolactinemia (breast swelling, galactorrhea) since being transitioned off Invega and onto Abilify.  Repeat prolactin level ordered to ensure normalization.  She feels she is tolerating Abilify well although continues to experience self derogatory voices on a daily basis.  It appears that to some degree auditory hallucinations are likely her baseline however recommended further titration to target frequency and intensity of voices as well as impact voices are having on mood.  Patient declined for today, citing that she feels voices are not leading to significant distress, dysfunction, or interfering with her goals at this time.  Will continue to explore this at future visits.  Patient does express interest in potentially being transitioned to LAI in the future and provided education that available LAI options would be equivalent to a higher oral dose; patient will continue to consider this.  Plan to return to care in approximately 6 weeks as this works best with transportation availability.   Identifying Information: Sandra Bell is a 51 y.o. female with a history of schizoaffective disorder, HTN, and HLD who is an established patient with [facility] for management of schizoaffective disorder.   Plan:  # Schizoaffective disorder, depressive type Past medication trials: Invega PO; [name] Status of problem: stable Interventions: -- Continue Abilify 5 mg daily  # Medication monitoring Interventions: -- Lipid profile 08/30/21 wnl -- Last Hgb A1c in Jan 2022; repeat A1c ordered -- While on Invega, experienced elevated PRL levels: 142 04/12/21 --> 108 09/25/21  -- Prolactin level ordered  Patient was given contact information for  behavioral health clinic and was instructed to call 911 for emergencies.   Subjective:  Chief Complaint:  Chief Complaint  Patient presents with   Medication Management    Interval History:  Last seen by [name], NP on 09/25/21. At that time, managed on: Invega 9 mg daily. During that visit, patient reported stopping medication due to dizziness and abdominal pain. PO Invega was restarted at lower dose of 6 mg daily. However, patient was later noted to have continued elevated PRL levels (142 --> 108) and was transitioned from [place] to Abilify 5 mg daily.   Today, patient is seen with her sister, Sandra Bell, who is her power of attorney. Patient finds verbal consent for sister to remain present during visit.  Patient reports she has been taking Abilify 5 mg daily - takes it at night and feels it helps her sleep. Had tried taking in the morning initially but didn't like this. Some intermittent dizziness; encouraged to stay hydrated. Denies any other adverse effects.   Since stopping Invega, reports breast swelling and galactorrhea have resolved (has not experienced this for the past 3 months). Denies impact on menses as she went through menopause 5 years ago.   Mood has been a bit "irritable" due to ongoing voices. Voices say negative things about her - states she doesn't believe what they say about her. Voices occur all throughout the day, on daily basis. She feels she is somewhat effective in ignoring voices - meditates in the morning, reads at night, plays music. Denies CAH. Denies VH.   Denies feeling down or depressed. Anxiety has been overall manageable. Denies SI, HI (last experienced HI in 2011). States that voices in the past have led to "destructive" behaviors such as throwing glass of  water at dad and other acting out behaviors. Denies any recent destructive behaviors.  Appetite has been good. Sleeping about 7-8 hours night.  Discussed option of increasing Abilify to 10 mg to  further target voices and subsequent irritability.  Patient expresses preference to remain at low dose of Abilify and feels like effectiveness at current dose is similar to how she was doing on [place].  Explored with patient what her goals are and if voices are interfering with goals or functioning.  She reports at baseline she is withdrawn and like spending time alone.  She feels she is able to still perform her daily functions without issue like showering, cooking.  Patient prefers to remain at current dosing for time being although may consider further increase if experiencing distress or dysfunction related to voices.  She does express interest in potentially being transitioned to LAI in the future; discussed that LAI options would be equivalent to a higher oral dose (10-15 mg) than she is on currently but that often patients tolerate well due to more gradual absorption.  Discussed plan to repeat prolactin level as well as obtain updated A1c.  Sister shares they are in the process of helping patient apply for disability and Medicaid; they already have a contact who is helping them with this process. Patient lives with her mother but manages medications independently.   Visit Diagnosis:    ICD-10-CM   1. Schizoaffective disorder, depressive type (HCC)  F25.1     2. High risk medication use  Z79.899 Prolactin    HgB A1c    3. History of hyperprolactinemia  Z86.39       Past Psychiatric History:  Diagnoses: schizoaffective disorder, depressive type Medication trials:  Invega PO (dizziness, abd pain, hyperPRL); Invega Trinza (hyperPRL with galactorrhea) Hospitalizations: last psychiatric hospitalization in 2011 Suicide attempts: denies SIB: denies Current access to guns: denies Substance use: denies use of etoh, tobacco, or illicit drugs Living: lives with her mother; patient manages medications independently  Past Medical History:  Past Medical History:  Diagnosis Date   Hypertension     Schizoaffective disorder (HCC) 2003    Past Surgical History:  Procedure Laterality Date   FINGER SURGERY  1983    Family Psychiatric History: denies  Family History:  Family History  Problem Relation Age of Onset   Hyperlipidemia Mother    Hypertension Mother    Hyperlipidemia Father    Hypertension Father    Heart disease Father    Breast cancer Sister     Social History:  Social History   Socioeconomic History   Marital status: Divorced    Spouse name: Not on file   Number of children: Not on file   Years of education: Not on file   Highest education level: Professional school degree (e.g., MD, DDS, DVM, JD)  Occupational History   Not on file  Tobacco Use   Smoking status: Never   Smokeless tobacco: Never  Vaping Use   Vaping Use: Never used  Substance and Sexual Activity   Alcohol use: No   Drug use: No   Sexual activity: Not Currently  Other Topics Concern   Not on file  Social History Narrative   Not on file   Social Determinants of Health   Financial Resource Strain: Not on file  Food Insecurity: No Food Insecurity (06/29/2021)   Hunger Vital Sign    Worried About Running Out of Food in the Last Year: Never true    Ran Out of Food  in the Last Year: Never true  Transportation Needs: No Transportation Needs (06/29/2021)   PRAPARE - Hydrologist (Medical): No    Lack of Transportation (Non-Medical): No  Physical Activity: Not on file  Stress: Not on file  Social Connections: Not on file    Allergies: No Known Allergies  Current Medications: Current Outpatient Medications  Medication Sig Dispense Refill   amLODipine (NORVASC) 5 MG tablet Take 1 tablet (5 mg total) by mouth daily. 30 tablet 6   amoxicillin (AMOXIL) 500 MG capsule TAKE 1 CAPSULE 3 TIMES DAILY UNTIL GONE. 21 capsule 0   ARIPiprazole (ABILIFY) 5 MG tablet Take 1 tablet (5 mg total) by mouth daily. 30 tablet 2   aspirin 81 MG tablet Take 81 mg by mouth  daily.     atorvastatin (LIPITOR) 10 MG tablet Take 1 tablet (10 mg total) by mouth daily. 30 tablet 6   chlorhexidine (PERIDEX) 0.12 % solution RINSE MOUTH WITH 15ML (1 CAPFUL) FOR 30 SECONDS in the morning and evening AFTER TOOTHBRUSHING. EXPECTORATE AFTER RINSING, DO NOT SWALLOW 473 mL 0   dexamethasone (DECADRON) 4 MG tablet Take 1 tablet by mouth 3 times daily. 9 tablet 0   HYDROcodone-acetaminophen (NORCO/VICODIN) 5-325 MG tablet TAKE 1 TABLET EVERY 4 TO 6 HOURS AS NEEDED FOR PAIN. 5 tablet 0   ibuprofen (ADVIL) 400 MG tablet TAKE 1 TABLET EVERY 4 HOURS AS NEEDED FOR PAIN. NO MORE THAN 6 TABLETS PER DAY. 30 tablet 0   No current facility-administered medications for this visit.    ROS: Not endorse any physical complaints. Denies breast swelling or tenderness; galactorrhea  Objective:  Psychiatric Specialty Exam: There were no vitals taken for this visit.There is no height or weight on file to calculate BMI.  General Appearance: Casual, Well Groomed, and hair wrapped in towel  Eye Contact:  Good  Speech:  Clear and Coherent and Normal Rate; decreased variation in tone  Volume:  Normal  Mood:   "irritable due to voices"  Affect:  Constricted and Euthymic  Thought Content:  Endorses daily self-derogatory AH; denies CAH. Denies VH.    Suicidal Thoughts:  No  Homicidal Thoughts:  No  Thought Process:  Goal Directed and Linear  Orientation:  Full (Time, Place, and Person)    Memory:   Grossly intact  Judgment:  Fair  Insight:  Fair  Concentration:  Concentration: Good  Recall:  NA  Fund of Knowledge: Good  Language: Good  Psychomotor Activity:  Normal  Akathisia:  No  AIMS (if indicated): not done  Assets:  Communication Skills Desire for Improvement Housing Leisure Time Physical Health Social Support Transportation  ADL's:  Intact  Cognition: WNL  Sleep:  Good   PE: General: well-appearing; no acute distress  Pulm: no increased work of breathing on room air   Strength & Muscle Tone: within normal limits Neuro: no focal neurological deficits observed  Gait & Station: normal  Metabolic Disorder Labs: Lab Results  Component Value Date   HGBA1C 4.6 (L) 03/17/2020   Lab Results  Component Value Date   PROLACTIN 108.0 (H) 09/25/2021   PROLACTIN 142.0 (H) 04/12/2021   Lab Results  Component Value Date   CHOL 153 08/30/2021   TRIG 70 08/30/2021   HDL 58 08/30/2021   CHOLHDL 2.6 08/30/2021   VLDL 15 10/17/2015   LDLCALC 81 08/30/2021   LDLCALC 198 (H) 07/18/2021   Lab Results  Component Value Date   TSH 1.750 04/01/2018  TSH 1.312 10/26/2014    Therapeutic Level Labs: No results found for: "LITHIUM" No results found for: "VALPROATE" No results found for: "CBMZ"  Screenings: AIMS    Flowsheet Row Office Visit from 05/30/2021 in [facility]  AIMS Total Score 0      GAD-7    Flowsheet Row Video Visit from 09/25/2021 in [facility] Clinical Support from 08/29/2021 in [facility] Office Visit from 07/18/2021 in [facility] Office Visit from 05/30/2021 in [facility] Office Visit from 04/12/2021 in [facility]  Total GAD-7 Score 0 0 1 1 1       PHQ2-9    Flowsheet Row Video Visit from 09/25/2021 in [facility] Clinical Support from 08/29/2021 in [facility] Office Visit from 07/18/2021 in [facility] Office Visit from 05/30/2021 in [facility] Office Visit from 05/18/2021 in [facility]  PHQ-2 Total Score 0 1 0 0 0  PHQ-9 Total Score 1 1 0 -- --       Collaboration of Care: Collaboration of Care: Medication Management AEB ongoing medication management and Psychiatrist AEB established with this provider  Patient/Guardian was advised Release of  Information must be obtained prior to any record release in order to collaborate their care with an outside provider. Patient/Guardian was advised if they have not already done so to contact the registration department to sign all necessary forms in order for [facility] to release information regarding their care.   Consent: Patient/Guardian gives verbal consent for treatment and assignment of benefits for services provided during this visit. Patient/Guardian expressed understanding and agreed to proceed.   A total of 50 minutes was spent involved in face to face clinical care, chart review, documentation, and medication management and counseling.   Eva Vallee A  01/09/2022, 12:51 PM

## 2022-01-08 NOTE — Patient Instructions (Signed)
Thank you for attending your appointment today.  -- We did not make any medication changes today. Continue Abilify as prescribed. -- Make an appointment with our clinic to get your labs drawn at your earliest convenience.   Please do not make any changes to medications without first discussing with your provider. If you are experiencing a psychiatric emergency, please call 911 or present to your nearest emergency department. Additional crisis, medication management, and therapy resources are included below.  [facility]  [address], [place], [place] [address] [phone] WALK-IN URGENT CARE 24/7 FOR ANYONE [address], [place], [place]  [phone] Fax: [phone] [email or web address] *Interpreters available *Accepts all insurance and uninsured for Urgent Care needs *Accepts Medicaid and uninsured for outpatient treatment (below)      ONLY FOR [facility]  Below:    Outpatient New Patient Assessment/Therapy Walk-ins:        Monday -Thursday 8am until slots are full.        Every Friday 1pm-4pm  (first come, first served)                   New Patient Psychiatry/Medication Management        Monday-Friday 8am-11am (first come, first served)               For all walk-ins we ask that you arrive by 7:15am, because patients will be seen in the order of arrival.

## 2022-01-09 ENCOUNTER — Encounter (HOSPITAL_COMMUNITY): Payer: Self-pay | Admitting: Psychiatry

## 2022-01-09 ENCOUNTER — Ambulatory Visit (INDEPENDENT_AMBULATORY_CARE_PROVIDER_SITE_OTHER): Payer: No Payment, Other | Admitting: Psychiatry

## 2022-01-09 ENCOUNTER — Other Ambulatory Visit: Payer: Self-pay

## 2022-01-09 DIAGNOSIS — Z79899 Other long term (current) drug therapy: Secondary | ICD-10-CM | POA: Diagnosis not present

## 2022-01-09 DIAGNOSIS — F251 Schizoaffective disorder, depressive type: Secondary | ICD-10-CM | POA: Diagnosis not present

## 2022-01-09 DIAGNOSIS — Z8639 Personal history of other endocrine, nutritional and metabolic disease: Secondary | ICD-10-CM | POA: Diagnosis not present

## 2022-01-09 MED ORDER — ARIPIPRAZOLE 5 MG PO TABS
5.0000 mg | ORAL_TABLET | Freq: Every day | ORAL | 2 refills | Status: DC
  Filled 2022-01-09 – 2022-01-22 (×2): qty 30, 30d supply, fill #0

## 2022-01-10 ENCOUNTER — Ambulatory Visit (HOSPITAL_COMMUNITY): Payer: No Payment, Other

## 2022-01-17 ENCOUNTER — Other Ambulatory Visit (HOSPITAL_COMMUNITY): Payer: No Payment, Other

## 2022-01-22 ENCOUNTER — Other Ambulatory Visit: Payer: Self-pay

## 2022-01-23 ENCOUNTER — Encounter: Payer: Self-pay | Admitting: Psychiatry

## 2022-01-23 NOTE — Addendum Note (Signed)
Addended by: Theodoro Kos A on: 01/23/2022 11:54 AM   Modules accepted: Orders

## 2022-01-25 ENCOUNTER — Other Ambulatory Visit: Payer: Self-pay

## 2022-01-26 LAB — HEMOGLOBIN A1C
Est. average glucose Bld gHb Est-mCnc: 97 mg/dL
Hgb A1c MFr Bld: 5 % (ref 4.8–5.6)

## 2022-01-26 LAB — PROLACTIN: Prolactin: 50.6 ng/mL — ABNORMAL HIGH (ref 4.8–23.3)

## 2022-01-31 ENCOUNTER — Telehealth (HOSPITAL_COMMUNITY): Payer: Self-pay | Admitting: *Deleted

## 2022-01-31 NOTE — Telephone Encounter (Signed)
PATIENT CALLED & STATED " HER LAWYERS ASKED HER TO CALL IF YOU HAD FAXED BACK THE MEDICAL HISTORY FORM & REQUESTED A CALL BACK [phone]

## 2022-02-12 ENCOUNTER — Telehealth (HOSPITAL_COMMUNITY): Payer: Self-pay | Admitting: Psychiatry

## 2022-02-12 NOTE — Telephone Encounter (Signed)
Received message from CMA that patient had called with concern about recurrence of breast pain and had stopped Abilify due to fear this was contributing.  Called patient back same day for approx. 10 min phone call: she states she began experiencing bilateral breast fullness and pain around 11/23; no galactorrhea or nipple discharge; no HA or dizziness. She is post-menopausal. She decreased Abilify to 1/2 tablet (2.5 mg daily) and felt that symptoms had improved so stopped Abilify completely about 3 days ago. She denies worsening psychiatric symptoms at this time.   Reviewed improving although still elevated PRL of 50.6 on 01/25/22. Discussed that Abilify is very unlikely to be causing elevated PRL and in fact is often used to treat hyperPRL. Patient cannot remember when she last received [name] but on chart review she did receive a gluteal injection of Trinza 819 mg on 05/30/21. On review of pharmaceutical information, half life ranges from 118-139 days for gluteal injection and it has been shown that release of drug can last as long as 18 months. It is possible that elevated although downtrending PRL level remains secondary to when patient was on [place] therapy; patient will require ongoing monitoring of PRL levels to ensure normalization over time. Given recent symptoms, recommended that she resume Abilify and she was amenable to restarting at 2.5 mg daily until our next visit on 02/22/22 and monitoring breast symptoms. Other than Abilify she is only taking atorvastatin and amlodipine which have not been associated with hyperPRL. Will also plan to obtain additional labwork including TSH and BMP to rule out medical causes of hyperPRL at next visit.   Daine Gip, MD 02/12/22

## 2022-02-21 NOTE — Progress Notes (Unsigned)
BH MD Outpatient Progress Note  02/22/2022 1:41 PM Jessa Stinson  MRN:  [id number]  Assessment:  Sandra Bell presents for follow-up evaluation in-person. Today, 02/22/22, patient reports she has been taking Abilify 2.5 mg nightly and feels this dose is working well to control symptoms of psychosis while minimizing side effects. Reports improvement although not yet full resolution of breast fullness/tenderness; encouraged to reach out to our clinic and PCP if she experiences any worsening of symptoms. She reports continued self-derogatory AH that appear to be at her baseline and denies interference with functioning; discussed option to increase Abilify or trial alternative antipsychotic however patient declined. Prolactin is downtrending although still elevated likely still related to [place] injection. Will continue to periodically monitor to ensure normalization.  Plan to RTC in 2 months; plan for coverage while this writer is on leave was discussed. Patient expresses preference for 3 month follow-ups; discussed that if stability maintained at next visit this interval can be considered.  Identifying Information: Sandra Bell is a 51 y.o. female with a history of schizoaffective disorder, HTN, and HLD who is an established patient with [facility] for management of schizoaffective disorder.   Plan:  # Schizoaffective disorder, depressive type Past medication trials: Invega PO; [name] Status of problem: stable Interventions: -- Continue Abilify 2.5 mg nightly (patient previously self-decreased from 5 mg); reports preferring nighttime dosing  # Medication monitoring Interventions: -- Lipid profile 08/30/21 wnl -- Hgb A1c 01/25/22 wnl -- While on Invega, experienced elevated PRL levels: 142 (04/12/21) --> 108 (09/25/21) --> 50.6 (01/25/22)  -- Last received Invega Trinza 819 mg on 05/30/21. On review of pharmaceutical information, half life ranges from  118-139 days for gluteal injection and it has been shown that release of drug can last as long as 18 months. It is possible that elevated although downtrending PRL level remains secondary to when patient was on [place] therapy  -- Plan to continue to monitor Q3 months to ensure ongoing decline; recommend obtaining TSH and BMP at next visit as well to rule out medical contributors (patient declined today)  Patient was given contact information for behavioral health clinic and was instructed to call 911 for emergencies.   Subjective:  Chief Complaint:  Chief Complaint  Patient presents with   Medication Management    Interval History:   Restarted Abilify 2.5 mg and says this has been going well. Likes it better than other medications as it helps her sleep but doesn't lead to daytime drowsiness; feels she likes lower dose of 2.5 compared to 5 mg. Continues to endorse AH but denies worsening since decrease in dose - states AH are derogatory calling her names or putting her down. Fairly constant throughout the day. Does find the voices frustrating but feels she has overall adjusted to them. Voices are better during quiet time, listening to music or reading. Denies that voices interfere with functioning and able to keep up with routine at home. Describes herself as a homebody and mostly keeps to herself; feels that voices may be more distracting and irritating if she were around a lot of people/out of the house more but has little desire for this. Denies VH. Denies SI, HI.   Patient describes her daily routine:  8AM: Starts morning with meditation  9AM: drinks chai tea 10AM: showers 11AM: breakfast (fruit) 2PM: lunch (yogurt with oats and honey) 4PM: dinner (savory soup with cheese, spinach, and toast) 8PM-11PM: devotional period in which she reads scripture, plays music 11PM-7AM:  sleeps  Denies any difficulty falling or staying asleep. Appetite is normal - denies intentional restricting  or distortion of body image. She feels this routine works well for her in controlling her symptoms. Discussed option of further increase in Abilify or exploring alternative medications to attempt better control of AH however she denies, feeling she is happy with current status of symptoms.   Reports breast swelling and pain is still there but getting better. Denies galactorrhea or nipple discharge. Denies breast lumps. Mammogram performed in April 2023 which was normal.   Reviewed downtrending PRL likely related to past [place] injection. Discussed need for ongoing monitoring. Recommended obtaining TSH and BMP today to rule out medical causes of hyperPRL however patient declined and prefers to obtain at next appt.    Visit Diagnosis:    ICD-10-CM   1. Schizoaffective disorder, depressive type (HCC)  F25.1     2. History of hyperprolactinemia  Z86.39       Past Psychiatric History:  Diagnoses: schizoaffective disorder, depressive type Medication trials:  Invega PO (dizziness, abd pain, hyperPRL); Invega Trinza (hyperPRL with galactorrhea) Hospitalizations: last psychiatric hospitalization in 2011 Suicide attempts: denies SIB: denies Current access to guns: denies Substance use: denies use of etoh, tobacco, or illicit drugs Living: lives with her mother; patient manages medications independently  Past Medical History:  Past Medical History:  Diagnosis Date   Hypertension    Schizoaffective disorder (HCC) 2003    Past Surgical History:  Procedure Laterality Date   FINGER SURGERY  1983   Family Psychiatric History: denies  Family History:  Family History  Problem Relation Age of Onset   Hyperlipidemia Mother    Hypertension Mother    Hyperlipidemia Father    Hypertension Father    Heart disease Father    Breast cancer Sister     Social History:  Social History   Socioeconomic History   Marital status: Divorced    Spouse name: Not on file   Number of children:  Not on file   Years of education: Not on file   Highest education level: Professional school degree (e.g., MD, DDS, DVM, JD)  Occupational History   Not on file  Tobacco Use   Smoking status: Never   Smokeless tobacco: Never  Vaping Use   Vaping Use: Never used  Substance and Sexual Activity   Alcohol use: No   Drug use: No   Sexual activity: Not Currently  Other Topics Concern   Not on file  Social History Narrative   Not on file   Social Determinants of Health   Financial Resource Strain: Not on file  Food Insecurity: No Food Insecurity (06/29/2021)   Hunger Vital Sign    Worried About Running Out of Food in the Last Year: Never true    Ran Out of Food in the Last Year: Never true  Transportation Needs: No Transportation Needs (06/29/2021)   PRAPARE - Administrator, Civil ServiceTransportation    Lack of Transportation (Medical): No    Lack of Transportation (Non-Medical): No  Physical Activity: Not on file  Stress: Not on file  Social Connections: Not on file    Allergies: No Known Allergies  Current Medications: Current Outpatient Medications  Medication Sig Dispense Refill   amLODipine (NORVASC) 5 MG tablet Take 1 tablet (5 mg total) by mouth daily. 30 tablet 6   atorvastatin (LIPITOR) 10 MG tablet Take 1 tablet (10 mg total) by mouth daily. 30 tablet 6   ARIPiprazole (ABILIFY) 5 MG tablet Take 1  tablet (5 mg total) by mouth daily. (Patient taking differently: Take 2.5 mg by mouth daily.) 30 tablet 2   aspirin 81 MG tablet Take 81 mg by mouth daily. (Patient not taking: Reported on 02/22/2022)     No current facility-administered medications for this visit.    ROS: Not endorse any physical complaints. Reports improving breast swelling and tenderness; denies galactorrhea or breast masses  Objective:  Psychiatric Specialty Exam: Blood pressure (!) 140/101, pulse 99, temperature 98.2 F (36.8 C), height 5\' 5"  (1.651 m), weight 131 lb (59.4 kg).Body mass index is 21.8 kg/m.  General  Appearance: Casual, Well Groomed, and wearing multiple layers  Eye Contact:  Good  Speech:  Clear and Coherent and somewhat slowed rate ; decreased variation in tone  Volume:  Normal  Mood:   "good"  Affect:  Constricted and Euthymic; pleasant  Thought Content:  Endorses daily self-derogatory AH; denies CAH. Denies VH.    Suicidal Thoughts:  No  Homicidal Thoughts:  No  Thought Process:  Goal Directed and Linear  Orientation:  Full (Time, Place, and Person)    Memory:   Grossly intact  Judgment:  Fair  Insight:  Fair  Concentration:  Concentration: Good  Recall:  NA  Fund of Knowledge: Good  Language: Good  Psychomotor Activity:  Normal  Akathisia:  No  AIMS (if indicated): not done  Assets:  Communication Skills Desire for Improvement Housing Leisure Time Physical Health Social Support Transportation  ADL's:  Intact  Cognition: WNL  Sleep:  Good   PE: General: well-appearing; no acute distress  Pulm: no increased work of breathing on room air  Strength & Muscle Tone: within normal limits Neuro: no focal neurological deficits observed  Gait & Station: normal  Metabolic Disorder Labs: Lab Results  Component Value Date   HGBA1C 5.0 01/25/2022   Lab Results  Component Value Date   PROLACTIN 50.6 (H) 01/25/2022   PROLACTIN 108.0 (H) 09/25/2021   Lab Results  Component Value Date   CHOL 153 08/30/2021   TRIG 70 08/30/2021   HDL 58 08/30/2021   CHOLHDL 2.6 08/30/2021   VLDL 15 10/17/2015   LDLCALC 81 08/30/2021   LDLCALC 198 (H) 07/18/2021   Lab Results  Component Value Date   TSH 1.750 04/01/2018   TSH 1.312 10/26/2014    Therapeutic Level Labs: No results found for: "LITHIUM" No results found for: "VALPROATE" No results found for: "CBMZ"  Screenings: AIMS    Flowsheet Row Office Visit from 05/30/2021 in [facility]  AIMS Total Score 0      GAD-7    Flowsheet Row Video Visit from 09/25/2021 in [facility] Clinical Support from 08/29/2021 in [facility] Office Visit from 07/18/2021 in [facility] Office Visit from 05/30/2021 in [facility] Office Visit from 04/12/2021 in [facility]  Total GAD-7 Score 0 0 1 1 1       PHQ2-9    Flowsheet Row Video Visit from 09/25/2021 in [facility] Clinical Support from 08/29/2021 in [facility] Office Visit from 07/18/2021 in [facility] Office Visit from 05/30/2021 in [facility] Office Visit from 05/18/2021 in [facility]  PHQ-2 Total Score 0 1 0 0 0  PHQ-9 Total Score 1 1 0 -- --       Collaboration of  Care: Collaboration of Care: Medication Management AEB ongoing medication management and Psychiatrist AEB established with this provider  Patient/Guardian was advised Release of Information must be obtained prior to any record release in order to collaborate their care with an outside provider. Patient/Guardian was advised if they have not already done so to contact the registration department to sign all necessary forms in order for [place] to release information regarding their care.   Consent: Patient/Guardian gives verbal consent for treatment and assignment of benefits for services provided during this visit. Patient/Guardian expressed understanding and agreed to proceed.   A total of 35 minutes was spent involved in face to face clinical care, chart review, documentation, and medication management and counseling.   Cynda Soule A  02/22/2022, 1:41 PM

## 2022-02-21 NOTE — Patient Instructions (Signed)
Thank you for attending your appointment today.  -- We did not make any medication changes today. Please continue medications as prescribed.  Please do not make any changes to medications without first discussing with your provider. If you are experiencing a psychiatric emergency, please call 911 or present to your nearest emergency department. Additional crisis, medication management, and therapy resources are included below.  [name]  [address], [place], [place] [address] [phone] WALK-IN URGENT CARE 24/7 FOR ANYONE [address], [place], [place]  [phone] Fax: [phone] [email or web address] *Interpreters available *Accepts all insurance and uninsured for Urgent Care needs *Accepts Medicaid and uninsured for outpatient treatment (below)      ONLY FOR [name] Residents  Below:    Outpatient New Patient Assessment/Therapy Walk-ins:        Monday -Thursday 8am until slots are full.        Every Friday 1pm-4pm  (first come, first served)                   New Patient Psychiatry/Medication Management        Monday-Friday 8am-11am (first come, first served)               For all walk-ins we ask that you arrive by 7:15am, because patients will be seen in the order of arrival.   

## 2022-02-22 ENCOUNTER — Other Ambulatory Visit: Payer: Self-pay

## 2022-02-22 ENCOUNTER — Encounter (HOSPITAL_COMMUNITY): Payer: No Payment, Other | Admitting: Psychiatry

## 2022-02-22 ENCOUNTER — Encounter (HOSPITAL_COMMUNITY): Payer: Self-pay | Admitting: Psychiatry

## 2022-02-22 ENCOUNTER — Ambulatory Visit (INDEPENDENT_AMBULATORY_CARE_PROVIDER_SITE_OTHER): Payer: No Payment, Other | Admitting: Psychiatry

## 2022-02-22 VITALS — BP 140/101 | HR 99 | Temp 98.2°F | Ht 65.0 in | Wt 131.0 lb

## 2022-02-22 DIAGNOSIS — Z8639 Personal history of other endocrine, nutritional and metabolic disease: Secondary | ICD-10-CM | POA: Diagnosis not present

## 2022-02-22 DIAGNOSIS — F251 Schizoaffective disorder, depressive type: Secondary | ICD-10-CM

## 2022-02-22 MED ORDER — ARIPIPRAZOLE 5 MG PO TABS
2.5000 mg | ORAL_TABLET | Freq: Every evening | ORAL | 2 refills | Status: DC
  Filled 2022-02-22 – 2022-03-06 (×2): qty 15, 30d supply, fill #0
  Filled 2022-04-09: qty 15, 30d supply, fill #1

## 2022-03-01 ENCOUNTER — Other Ambulatory Visit: Payer: Self-pay

## 2022-03-06 ENCOUNTER — Other Ambulatory Visit: Payer: Self-pay

## 2022-03-08 ENCOUNTER — Other Ambulatory Visit: Payer: Self-pay

## 2022-03-27 ENCOUNTER — Ambulatory Visit: Payer: Self-pay | Attending: Internal Medicine | Admitting: Internal Medicine

## 2022-03-27 ENCOUNTER — Other Ambulatory Visit: Payer: Self-pay

## 2022-03-27 ENCOUNTER — Encounter: Payer: Self-pay | Admitting: Internal Medicine

## 2022-03-27 VITALS — BP 125/90 | HR 116 | Temp 98.5°F | Ht 65.0 in | Wt 130.0 lb

## 2022-03-27 DIAGNOSIS — F251 Schizoaffective disorder, depressive type: Secondary | ICD-10-CM

## 2022-03-27 DIAGNOSIS — E782 Mixed hyperlipidemia: Secondary | ICD-10-CM

## 2022-03-27 DIAGNOSIS — I10 Essential (primary) hypertension: Secondary | ICD-10-CM

## 2022-03-27 DIAGNOSIS — E221 Hyperprolactinemia: Secondary | ICD-10-CM

## 2022-03-27 MED ORDER — AMLODIPINE BESYLATE 10 MG PO TABS
10.0000 mg | ORAL_TABLET | Freq: Every day | ORAL | 6 refills | Status: DC
  Filled 2022-03-27 – 2022-04-09 (×2): qty 30, 30d supply, fill #0
  Filled 2022-05-07: qty 30, 30d supply, fill #1
  Filled 2022-06-13: qty 30, 30d supply, fill #2
  Filled 2022-07-11: qty 30, 30d supply, fill #3
  Filled 2022-08-08: qty 30, 30d supply, fill #4
  Filled 2022-09-04: qty 30, 30d supply, fill #5
  Filled 2022-10-16: qty 30, 30d supply, fill #6

## 2022-03-27 NOTE — Progress Notes (Signed)
Patient ID: Sandra Bell, female    DOB: 1970-11-02  MRN: [id number]  CC: Hypertension (Htn f/u. )   Subjective: Sandra Bell is a 52 y.o. female who presents for chronic ds management Her concerns today include:  Pt with hx of HTN, HL, schizoaffective disorder, Fhx of breast CA in sister (reportedly tested neg for BRCA), elev Prolactin level due to [place]   HYPERTENSION Currently taking: see medication list.  Norvasc increased to 5 mg on last visit Med Adherence: [x]  Yes    []  No Medication side effects: []  Yes    [x]  No Adherence with salt restriction: [x]  Yes    []  No Home Monitoring?: [x]  Yes    []  No Monitoring Frequency: daily until device quit working 2 mths ago.  Has log.  Some readings - 87/83, 91/90, 108/88.  Most SBP reading low but denies any dizziness Home BP results range:  SOB? []  Yes    [x]  No Chest Pain?: []  Yes    [x]  No Leg swelling?: []  Yes    [x]  No Headaches?: []  Yes    [x]  No Dizziness? []  Yes    [x]  No Comments: only meat she eats is fish  HL:  tolerating Lipitor 10 mg daily  Schizoaffective disorder: Continue to be followed by behavioral health.  She is on Abilify.  She feels she is doing well on the Abilify.  She states that it helps her sleep well at night and helps to drown out some of the voices that she hears.  The voices are derogatory. She has learned to live with it.  She ahs discusse with her [facility] provider. She denies any homicidal or suicidal ideation.  Prolactin level has been coming down since Invega changes to Abilify.  Last level in November was 50.6 down from 108  Patient Active Problem List   Diagnosis Date Noted   Hyperprolactinemia ([place]) 03/27/2022   History of lump of left breast 05/18/2021   Family history of breast cancer 05/18/2021   History of hyperprolactinemia 05/18/2021   Breast lump 03/17/2020   Essential hypertension 02/05/2012   Schizoaffective disorder, depressive type ([place]) 01/03/2012   HLD (hyperlipidemia)  01/03/2012     Current Outpatient Medications on File Prior to Visit  Medication Sig Dispense Refill   ARIPiprazole (ABILIFY) 5 MG tablet Take 0.5 tablets (2.5 mg total) by mouth at bedtime. 15 tablet 2   aspirin 81 MG tablet Take 81 mg by mouth daily.     atorvastatin (LIPITOR) 10 MG tablet Take 1 tablet (10 mg total) by mouth daily. 30 tablet 6   No current facility-administered medications on file prior to visit.    No Known Allergies  Social History   Socioeconomic History   Marital status: Divorced    Spouse name: Not on file   Number of children: Not on file   Years of education: Not on file   Highest education level: Professional school degree (e.g., MD, DDS, DVM, JD)  Occupational History   Not on file  Tobacco Use   Smoking status: Never   Smokeless tobacco: Never  Vaping Use   Vaping Use: Never used  Substance and Sexual Activity   Alcohol use: No   Drug use: No   Sexual activity: Not Currently  Other Topics Concern   Not on file  Social History Narrative   Not on file   Social Determinants of Health   Financial Resource Strain: Not on file  Food Insecurity: No Food Insecurity (06/29/2021)  Hunger Vital Sign    Worried About Running Out of Food in the Last Year: Never true    Ran Out of Food in the Last Year: Never true  Transportation Needs: No Transportation Needs (06/29/2021)   PRAPARE - Hydrologist (Medical): No    Lack of Transportation (Non-Medical): No  Physical Activity: Not on file  Stress: Not on file  Social Connections: Not on file  Intimate Partner Violence: Not on file    Family History  Problem Relation Age of Onset   Hyperlipidemia Mother    Hypertension Mother    Hyperlipidemia Father    Hypertension Father    Heart disease Father    Breast cancer Sister     Past Surgical History:  Procedure Laterality Date   FINGER SURGERY  1983    ROS: Review of Systems Negative except as stated  above  PHYSICAL EXAM: BP (!) 125/90 (BP Location: Left Arm, Patient Position: Sitting, Cuff Size: Normal)   Pulse (!) 116   Temp 98.5 F (36.9 C) (Oral)   Ht 5\' 5"  (1.651 m)   Wt 130 lb (59 kg)   LMP  (LMP Unknown)   SpO2 100%   BMI 21.63 kg/m   Physical Exam Repeat BP 130/90 General appearance - alert, well appearing, and in no distress. Clothing clean.   Mental status -quiet demeanor.  Answers questions appropriately. Chest - clear to auscultation, no wheezes, rales or rhonchi, symmetric air entry Heart - normal rate, regular rhythm, normal S1, S2, no murmurs, rubs, clicks or gallops Extremities - peripheral pulses normal, no pedal edema, no clubbing or cyanosis      Latest Ref Rng & Units 08/30/2021    9:04 AM 04/12/2021    5:22 PM 03/17/2020    9:01 AM  CMP  Glucose 70 - 99 mg/dL  81  87   BUN 6 - 24 mg/dL  7  5   Creatinine 0.57 - 1.00 mg/dL  1.02  1.02   Sodium 134 - 144 mmol/L  142  143   Potassium 3.5 - 5.2 mmol/L  4.0  4.3   Chloride 96 - 106 mmol/L  102  105   CO2 20 - 29 mmol/L   23   Calcium 8.7 - 10.2 mg/dL  10.2  9.9   Total Protein 6.0 - 8.5 g/dL 7.5  7.9    Total Bilirubin 0.0 - 1.2 mg/dL 0.8  0.7    Alkaline Phos 44 - 121 IU/L 66  72    AST 0 - 40 IU/L 15  16    ALT 0 - 32 IU/L 13      Lipid Panel     Component Value Date/Time   CHOL 153 08/30/2021 0904   TRIG 70 08/30/2021 0904   HDL 58 08/30/2021 0904   CHOLHDL 2.6 08/30/2021 0904   CHOLHDL 2.7 10/17/2015 0959   VLDL 15 10/17/2015 0959   LDLCALC 81 08/30/2021 0904    CBC    Component Value Date/Time   WBC 4.1 09/25/2021 1210   WBC 5.5 11/22/2014 0850   RBC 4.24 09/25/2021 1210   RBC 4.10 11/22/2014 0850   HGB 13.0 09/25/2021 1210   HCT 39.2 09/25/2021 1210   PLT 367 09/25/2021 1210   MCV 93 09/25/2021 1210   MCH 30.7 09/25/2021 1210   MCH 32.4 11/22/2014 0850   MCHC 33.2 09/25/2021 1210   MCHC 33.8 11/22/2014 0850   RDW 12.5 09/25/2021 1210   LYMPHSABS  2.3 09/25/2021 1210   MONOABS  0.6 11/22/2014 0850   EOSABS 0.1 09/25/2021 1210   BASOSABS 0.0 09/25/2021 1210    ASSESSMENT AND PLAN: 1. Essential hypertension Not at goal Increase Norvasc to 10 mg daily - amLODipine (NORVASC) 10 MG tablet; Take 1 tablet (10 mg total) by mouth daily.  Dispense: 30 tablet; Refill: 6  2. Hyperprolactinemia (HCC) Due to psychiatric medication.  Steadily trending down since taken off Invega.  3. Schizoaffective disorder, depressive type (HCC) Stable and followed by behavioral health.  4. Mixed hyperlipidemia Continue atorvastatin 10 mg daily.    Patient was given the opportunity to ask questions.  Patient verbalized understanding of the plan and was able to repeat key elements of the plan.   This documentation was completed using Paediatric nurse.  Any transcriptional errors are unintentional.  No orders of the defined types were placed in this encounter.    Requested Prescriptions   Signed Prescriptions Disp Refills   amLODipine (NORVASC) 10 MG tablet 30 tablet 6    Sig: Take 1 tablet (10 mg total) by mouth daily.    Return in about 4 months (around 07/26/2022).  Jonah Blue, MD, FACP

## 2022-03-27 NOTE — Patient Instructions (Signed)
Blood pressure goal is 130/80 or lower.  Your bottom number is not at goal.  We have increased the amlodipine from 5 mg daily to 10 mg daily.

## 2022-04-09 ENCOUNTER — Other Ambulatory Visit: Payer: Self-pay

## 2022-04-11 ENCOUNTER — Other Ambulatory Visit: Payer: Self-pay

## 2022-04-27 ENCOUNTER — Encounter (HOSPITAL_COMMUNITY): Payer: Self-pay | Admitting: Student in an Organized Health Care Education/Training Program

## 2022-04-27 ENCOUNTER — Ambulatory Visit (INDEPENDENT_AMBULATORY_CARE_PROVIDER_SITE_OTHER): Payer: No Payment, Other | Admitting: Student in an Organized Health Care Education/Training Program

## 2022-04-27 ENCOUNTER — Other Ambulatory Visit: Payer: Self-pay

## 2022-04-27 VITALS — BP 118/89 | HR 109 | Ht 65.0 in | Wt 135.0 lb

## 2022-04-27 DIAGNOSIS — F251 Schizoaffective disorder, depressive type: Secondary | ICD-10-CM | POA: Diagnosis not present

## 2022-04-27 DIAGNOSIS — Z5181 Encounter for therapeutic drug level monitoring: Secondary | ICD-10-CM

## 2022-04-27 DIAGNOSIS — E221 Hyperprolactinemia: Secondary | ICD-10-CM | POA: Diagnosis not present

## 2022-04-27 MED ORDER — ARIPIPRAZOLE 5 MG PO TABS
2.5000 mg | ORAL_TABLET | Freq: Every evening | ORAL | 2 refills | Status: DC
  Filled 2022-04-27 – 2022-05-07 (×2): qty 15, 30d supply, fill #0
  Filled 2022-06-13: qty 15, 30d supply, fill #1

## 2022-04-27 NOTE — Progress Notes (Signed)
BH MD/PA/NP OP Progress Note  04/27/2022 1:45 PM Sandra Bell  MRN:  [id number]  Chief Complaint:  Chief Complaint  Patient presents with   Medication Management   HPI: Sandra Bell is a 52 y.o. female with a history of schizoaffective disorder, HTN, and HLD who is an established patient with [facility] for management of schizoaffective disorder. Patietn is currently managed on   Abilify 2.92m QHS  On assessment today patient reports that she feels stable.  Patient reports that she does not feel any worse than at her prior visit and endorses overall she feels she is doing well.  Patient reports she likes the Abilify as it helps her get a good night rest.  Patient reports that by feeling well rested she is more able to manage the voices that she hears throughout the day.  Patient reports that while the voices are irritating she is not interested in increasing her Abilify dose.  Patient reports the voices continue to be self-deprecating but she denies feeling hopeless/worthless/guilty.  Patient denies any significant anhedonia or change in concentration.  Patient reports she continues to enjoy doing her art work and posted on the walls in her home.  Patient reported her mood has overall been "stable" her appetite is good.  Patient reports that while she does constantly feel irritable she is proud of herself that she has remained out of the hospital for 13 years and has not damaged items, thrown things or hurt others.  Patient denies HI currently as well as SI and VH.  Patient is reluctant to get prolactin level drawn in house today due to being a hard stick, but is willing to go to [facility] next week.  Visit Diagnosis:    ICD-10-CM   1. Schizoaffective disorder, depressive type ([place]  F25.1 ARIPiprazole (ABILIFY) 5 MG tablet    2. Hyperprolactinemia (HCC)  E22.1 Prolactin    3. Medication monitoring encounter  Z51.81 Comprehensive Metabolic Panel (CMET)    TSH       Past Psychiatric History:  Diagnoses: schizoaffective disorder, depressive type Medication trials:  Invega PO (dizziness, abd pain, hyperPRL); Invega Trinza (hyperPRL with galactorrhea) Hospitalizations: last psychiatric hospitalization in 2011 Suicide attempts: denies SIB: denies Current access to guns: denies Substance use: denies use of etoh, tobacco, or illicit drugs Living: lives with her mother; patient manages medications independently  Past Medical History:  Past Medical History:  Diagnosis Date   Hypertension    Schizoaffective disorder ([place] 2003    Past Surgical History:  Procedure Laterality Date   FINGER SURGERY  1983    Family Psychiatric History: denies  Family History:  Family History  Problem Relation Age of Onset   Hyperlipidemia Mother    Hypertension Mother    Hyperlipidemia Father    Hypertension Father    Heart disease Father    Breast cancer Sister     Social History:  Social History   Socioeconomic History   Marital status: Divorced    Spouse name: Not on file   Number of children: Not on file   Years of education: Not on file   Highest education level: Professional school degree (e.g., MD, DDS, DVM, JD)  Occupational History   Not on file  Tobacco Use   Smoking status: Never   Smokeless tobacco: Never  Vaping Use   Vaping Use: Never used  Substance and Sexual Activity   Alcohol use: No   Drug use: No   Sexual activity: Not Currently  Other Topics Concern   Not on file  Social History Narrative   Not on file   Social Determinants of Health   Financial Resource Strain: Not on file  Food Insecurity: No Food Insecurity (06/29/2021)   Hunger Vital Sign    Worried About Running Out of Food in the Last Year: Never true    Ran Out of Food in the Last Year: Never true  Transportation Needs: No Transportation Needs (06/29/2021)   PRAPARE - Hydrologist (Medical): No    Lack of Transportation  (Non-Medical): No  Physical Activity: Not on file  Stress: Not on file  Social Connections: Not on file    Allergies: No Known Allergies  Metabolic Disorder Labs: Lab Results  Component Value Date   HGBA1C 5.0 01/25/2022   Lab Results  Component Value Date   PROLACTIN 50.6 (H) 01/25/2022   PROLACTIN 108.0 (H) 09/25/2021   Lab Results  Component Value Date   CHOL 153 08/30/2021   TRIG 70 08/30/2021   HDL 58 08/30/2021   CHOLHDL 2.6 08/30/2021   VLDL 15 10/17/2015   LDLCALC 81 08/30/2021   LDLCALC 198 (H) 07/18/2021   Lab Results  Component Value Date   TSH 1.750 04/01/2018   TSH 1.312 10/26/2014    Therapeutic Level Labs: No results found for: "LITHIUM" No results found for: "VALPROATE" No results found for: "CBMZ"  Current Medications: Current Outpatient Medications  Medication Sig Dispense Refill   amLODipine (NORVASC) 10 MG tablet Take 1 tablet (10 mg total) by mouth daily. 30 tablet 6   ARIPiprazole (ABILIFY) 5 MG tablet Take 0.5 tablets (2.5 mg total) by mouth at bedtime. 15 tablet 2   aspirin 81 MG tablet Take 81 mg by mouth daily.     atorvastatin (LIPITOR) 10 MG tablet Take 1 tablet (10 mg total) by mouth daily. 30 tablet 6   No current facility-administered medications for this visit.     Musculoskeletal: Strength & Muscle Tone: within normal limits Gait & Station: normal Patient leans: N/A  Psychiatric Specialty Exam: Review of Systems  Psychiatric/Behavioral:  Positive for hallucinations. Negative for dysphoric mood and suicidal ideas.     Blood pressure 118/89, pulse (!) 109, height 5' 5"$  (1.651 m), weight 135 lb (61.2 kg), SpO2 100 %.Body mass index is 22.47 kg/m.  General Appearance: Casual  Eye Contact:  Good  Speech:  Clear and Coherent  Volume:  Normal  Mood:  Euthymic  Affect:  Appropriate  Thought Process:  Coherent  Orientation:  Full (Time, Place, and Person)  Thought Content: Hallucinations: Auditory   Suicidal Thoughts:  No   Homicidal Thoughts:  No  Memory:  Immediate;   Good Recent;   Good  Judgement:  Fair  Insight:  Good  Psychomotor Activity:  Normal  Concentration:  Concentration: Fair  Recall:  [place] of Knowledge: Fair  Language: Good  Akathisia:  No  Handed:    AIMS (if indicated): not done  Assets:  Communication Skills Desire for Improvement Housing Leisure Time Resilience Transportation  ADL's:  Intact  Cognition: WNL  Sleep:  Good   Screenings: [facility] Office Visit from 05/30/2021 in [place] Total Score 0      GAD-7    [facility] Office Visit from 03/27/2022 in [facility] Video Visit from 09/25/2021 in [facility] from 08/29/2021 in [place]  [facility] Office Visit from 07/18/2021 in [place] Office Visit from 05/30/2021 in [facility]  Total GAD-7 Score 1 0 0 1 1      PHQ2-9    [facility] Office Visit from 03/27/2022 in [place] Video Visit from 09/25/2021 in [place] from 08/29/2021 in [facility] Office Visit from 07/18/2021 in [facility] Office Visit from 05/30/2021 in [facility]  PHQ-2 Total Score 0 0 1 0 0  PHQ-9 Total Score 0 1 1 0 --        Assessment and Plan:  Sherryle Pickron is a 52 y.o. female with a history of schizoaffective disorder, HTN, and HLD who is an established patient with [facility] for management of schizoaffective disorder.  Based on assessment today patient appears to be fairly stable.  Patient has good insight into her diagnoses reports being aware that her auditory illusions may never go away, and feels although they are  irritable she can manage them and is able to go on with her daily life.  Patient does not wish to increase dosage as she does not feel she is declining despite Invega likely no longer be in her system.  Would not make any medication adjustments at this time. # Schizoaffective disorder, depressive type Past medication trials: Invega PO; Rolland Porter Status of problem: stable Interventions: -- Continue Abilify 2.5 mg nightly (patient previously self-decreased from 5 mg); reports preferring nighttime dosing   # Medication monitoring Interventions: -- Lipid profile 08/30/21 wnl -- Hgb A1c 01/25/22 wnl -- While on Invega, experienced elevated PRL levels: 142 (04/12/21) --> 108 (09/25/21) --> 50.6 (01/25/22)>>> [facility]             -- Last received Invega Trinza 819 mg on 05/30/21. On review of pharmaceutical information, half life ranges from 118-139 days for gluteal injection and it has been shown that release of drug can last as long as 18 months. It is possible that elevated although downtrending PRL level remains secondary to when patient was on [place] therapy             -- Plan to continue to monitor Q3 months to ensure ongoing decline; recommend obtaining TSH and BMP at next visit as well to rule out medical contributors (patient will [facility])   Patient was given contact information for behavioral health clinic and was instructed to call 911 for emergencies.  Collaboration of Care: Collaboration of Care:   Patient/Guardian was advised Release of Information must be obtained prior to any record release in order to collaborate their care with an outside provider. Patient/Guardian was advised if they have not already done so to contact the registration department to sign all necessary forms in order for [place] to release information regarding their care.   Consent: Patient/Guardian gives verbal consent for treatment and assignment of benefits for services provided during this visit.  Patient/Guardian expressed understanding and agreed to proceed.   PGY-3 Freida Busman, MD 04/27/2022, 1:45 PM

## 2022-04-27 NOTE — Patient Instructions (Signed)
[place] [address], [place] [address] Phone number: [phone], fax: [phone] Monday-Friday: 8 AM to 5 PM Close for lunch: Lunch 1 PM-2 PM Drug screen: 8:30 AM-12:30 PM, 2:30 PM-4:30 PM   [place] [address] [place], [place] [address] Phone number: [phone], fax: [phone] Monday-Friday 7 AM-4 PM Close for lunch 12 PM-1 PM Drug screening: 7:30 AM-11:30 AM, 1:30 PM-3:30 PM   [place] [address] [place], [place] [address] Phone number: [phone], fax: [phone] Monday-Friday: 8 AM-5 PM Close for lunch: 12 PM-1 PM Drug screen: 8:30 AM-11:30 AM, 1 PM-4:30 PM  [place] [address] [place], [place] [address] Phone: [phone], fax: [phone] Monday-Friday: 8 AM-5 PM Close for lunch: 12 PM-1 PM Drug screen 8 a.m.-11:30 AM, 1 PM-4:30 PM   [place] [address] [place], [place] [address] Phone number: [phone], fax: [phone] Monday-Friday: 7 AM-5 PM Drug screen: 8 AM-4 PM  [place] [address], [place], [place] [address] Phone number: [phone], fax: [phone] Monday-Friday: 8 AM-5 PM Closed for lunch: 12 PM-1 PM Saturday: 8 AM-12 PM Drug screen: 9 AM-11:30 AM 1 PM-4 PM  [place] [address] [place] [address] Phone number: [phone], fax: [phone] Monday-Friday: 8 AM-5 PM Closed for lunch lunch: 1 PM-1:30 PM Saturday 8 a.m.-12 PM Drug screening: 10 AM-11:30 AM, 1 PM-1:30 PM   [place] [address] [place], [place] [address] Phone number: [phone], fax: [phone] Monday-Friday: 7:30 AM-5 PM Drug screen: 10 AM-12 PM, 2 PM-4 PM

## 2022-05-07 ENCOUNTER — Other Ambulatory Visit: Payer: Self-pay

## 2022-05-08 ENCOUNTER — Other Ambulatory Visit: Payer: Self-pay

## 2022-05-08 ENCOUNTER — Telehealth: Payer: Self-pay | Admitting: Internal Medicine

## 2022-05-08 NOTE — Telephone Encounter (Signed)
Copied from [place] [phone]. Topic: General - Other >> May 08, 2022  9:05 AM [place] B wrote: Reason for CRM: Patient called in to discuss financial assistance.

## 2022-05-14 NOTE — Telephone Encounter (Signed)
Pt called back for update on request from last Tuesday. Says she has not received word from anyone yet.

## 2022-05-14 NOTE — Telephone Encounter (Signed)
Pt does not have any accts that would qualify for CAFA at this time.

## 2022-05-15 ENCOUNTER — Other Ambulatory Visit: Payer: Self-pay

## 2022-05-15 ENCOUNTER — Telehealth: Payer: Self-pay

## 2022-05-15 NOTE — Telephone Encounter (Signed)
I returned her call, she wanted to make sure she was enrolled in 'patient assistance'. She currently has an active enrollment in [facility] and when available, pharmacy can fill her prescriptions with their free stock. If unavailable in free stock her copays would be based off cost and no more than $10 each.

## 2022-05-15 NOTE — Telephone Encounter (Signed)
Copied from [place] [phone]. Topic: General - Inquiry >> May 15, 2022  9:10 AM Sandra Bell wrote: Reason for CRM: Pt stated requesting a callback from [facility] in pharmacy.  Please advise.

## 2022-06-07 ENCOUNTER — Telehealth: Payer: Self-pay | Admitting: Internal Medicine

## 2022-06-07 NOTE — Telephone Encounter (Signed)
Pt mailed [facility] app to PO Box and in the process of applying for disability (full hearing to be scheduled April-July) and hasn't worked since 2007. Will call pt to schedule financial counseling appt once app is recv'd from [facility].

## 2022-06-07 NOTE — Telephone Encounter (Signed)
Pt mailed [facility] app to PO Box and in the process of applying for disability (full hearing to be scheduled April-July) and hasn't worked since 2007. Will call pt to schedule financial counseling appt once app is recv'd.

## 2022-06-11 NOTE — Telephone Encounter (Signed)
Noted  

## 2022-06-13 ENCOUNTER — Other Ambulatory Visit: Payer: Self-pay

## 2022-06-15 ENCOUNTER — Other Ambulatory Visit: Payer: Self-pay

## 2022-06-17 NOTE — Progress Notes (Unsigned)
BH MD Outpatient Progress Note  06/19/2022 12:44 PM Sandra Bell  MRN:  [id number]  Assessment:  Sandra Bell presents for follow-up evaluation in-person. Today, 06/19/22, patient reports no significant changes from last visit. Although she continues to experience AH, she feels they do not interfere with functioning or mood and opts to remain at current low-dose of Abilify. No acute safety concerns. Discussed need to obtain updated prolactin to ensure continued downtrend as well as rule out potential medical contributors with TSH and CMP; patient provided with paper requisition form to bring to [facility].   RTC in 3 months in person.  Identifying Information: Sandra Bell is a 52 y.o. female with a history of schizoaffective disorder, HTN, and HLD who is an established patient with [facility] for management of schizoaffective disorder.   Plan:  # Schizoaffective disorder, depressive type Past medication trials: Invega PO; [name] Status of problem: stable Interventions: -- Continue Abilify 2.5 mg nightly (patient thus far has declined titration to higher dosing; prefers nighttime dosing)  # Medication monitoring Interventions: -- Lipid profile 08/30/21 wnl -- Hgb A1c 01/25/22 wnl -- While on Invega, experienced elevated PRL levels: 142 (04/12/21) --> 108 (09/25/21) --> 50.6 (01/25/22)  -- Last received Invega Trinza 819 mg on 05/30/21. On review of pharmaceutical information, half life ranges from 118-139 days for gluteal injection and it has been shown that release of drug can last as long as 18 months. It is possible that elevated although downtrending PRL level remains secondary to when patient was on [place] therapy  -- Lab requisition form for prolactin, TSH, and CMP provided to patient to bring to [facility] due to issues with electronic order  Patient was given contact information for behavioral health clinic and was instructed to call 911 for  emergencies.   Subjective:  Chief Complaint:  Chief Complaint  Patient presents with   Medication Management    Interval History:   Last seen by resident MD Dr. Morrie Sheldon on 04/27/22. At that time, no changes were made. Repeat labs ordered to be obtained at [facility].  Reports things have been "pretty good" and happy with current dosing of Abilify. Doesn't feel overmedicated but able to maintain good sleep. AH are the "same" - considers them a nuisance but feels she has somewhat adjusted to them. Doesn't feel they interfere with functionality as she considers herself an "introvert" and a "homebody." Continues to do yoga and meditation at home. Declines further increase of Abilify at this time.   Mood overall has been "stable" and denies feeling depressed or down; anxious. May occasionally get irritated by the voices. Denies SI, HI/thoughts of aggression. Denies VH.   Denies dizziness, constipation. No longer having breast pain or swelling. No galactorrhea.   Reports she went to [facility] however they did not have order in their system and was told to bring paper order forms. Lab requisition sheets provided to patient and she plans to go this week.  Visit Diagnosis:    ICD-10-CM   1. Schizoaffective disorder, depressive type  F25.1 ARIPiprazole (ABILIFY) 5 MG tablet    2. High risk medication use  Z79.899     3. Hyperprolactinemia  E22.1      Past Psychiatric History:  Diagnoses: schizoaffective disorder, depressive type Medication trials:  Invega PO (dizziness, abd pain, hyperPRL); Invega Trinza (hyperPRL with galactorrhea) Hospitalizations: last psychiatric hospitalization in 2011 Suicide attempts: denies SIB: denies Current access to guns: denies Substance use: denies use of etoh, tobacco, or illicit drugs  Living: lives with her mother; patient manages medications independently  Past Medical History:  Past Medical History:  Diagnosis Date   Hypertension    Schizoaffective  disorder 2003    Past Surgical History:  Procedure Laterality Date   FINGER SURGERY  1983   Family Psychiatric History: denies  Family History:  Family History  Problem Relation Age of Onset   Hyperlipidemia Mother    Hypertension Mother    Hyperlipidemia Father    Hypertension Father    Heart disease Father    Breast cancer Sister     Social History:  Social History   Socioeconomic History   Marital status: Divorced    Spouse name: Not on file   Number of children: Not on file   Years of education: Not on file   Highest education level: Professional school degree (e.g., MD, DDS, DVM, JD)  Occupational History   Not on file  Tobacco Use   Smoking status: Never   Smokeless tobacco: Never  Vaping Use   Vaping Use: Never used  Substance and Sexual Activity   Alcohol use: No   Drug use: No   Sexual activity: Not Currently  Other Topics Concern   Not on file  Social History Narrative   Not on file   Social Determinants of Health   Financial Resource Strain: Not on file  Food Insecurity: No Food Insecurity (06/29/2021)   Hunger Vital Sign    Worried About Running Out of Food in the Last Year: Never true    Ran Out of Food in the Last Year: Never true  Transportation Needs: No Transportation Needs (06/29/2021)   PRAPARE - Administrator, Civil Service (Medical): No    Lack of Transportation (Non-Medical): No  Physical Activity: Not on file  Stress: Not on file  Social Connections: Not on file    Allergies: No Known Allergies  Current Medications: Current Outpatient Medications  Medication Sig Dispense Refill   amLODipine (NORVASC) 10 MG tablet Take 1 tablet (10 mg total) by mouth daily. 30 tablet 6   ARIPiprazole (ABILIFY) 5 MG tablet Take 0.5 tablets (2.5 mg total) by mouth at bedtime. 15 tablet 3   aspirin 81 MG tablet Take 81 mg by mouth daily.     atorvastatin (LIPITOR) 10 MG tablet Take 1 tablet (10 mg total) by mouth daily. 30 tablet 6    No current facility-administered medications for this visit.    ROS: Denies dizziness; constipation; breast swelling/tenderness; galactorrhea   Objective:  Psychiatric Specialty Exam: Blood pressure 121/82, pulse 97, resp. rate 16, height 5\' 5"  (1.651 m), weight 130 lb 9.6 oz (59.2 kg), SpO2 100 %.Body mass index is 21.73 kg/m.  General Appearance: Casual, Well Groomed, and wearing multiple layers  of clothing  Eye Contact:  Good  Speech:  Clear and Coherent and Normal Rate  Volume:  Normal  Mood:   "good"  Affect:  Constricted and Euthymic; pleasant  Thought Content:  Endorses daily self-derogatory AH that are unchanged from prior; denies CAH. Denies VH.    Suicidal Thoughts:  No  Homicidal Thoughts:  No  Thought Process:  Goal Directed and Linear  Orientation:  Full (Time, Place, and Person)    Memory:   Grossly intact  Judgment:  Fair  Insight:  Fair  Concentration:  Concentration: Good  Recall:  NA  Fund of Knowledge: Good  Language: Good  Psychomotor Activity:  Normal  Akathisia:  No  AIMS (if indicated): not done  Assets:  Communication Skills Desire for Improvement Housing Leisure Time Physical Health Social Support Transportation  ADL's:  Intact  Cognition: WNL  Sleep:  Good   PE: General: well-appearing; no acute distress  Pulm: no increased work of breathing on room air  Strength & Muscle Tone: within normal limits Neuro: no focal neurological deficits observed  Gait & Station: normal  Metabolic Disorder Labs: Lab Results  Component Value Date   HGBA1C 5.0 01/25/2022   Lab Results  Component Value Date   PROLACTIN 50.6 (H) 01/25/2022   PROLACTIN 108.0 (H) 09/25/2021   Lab Results  Component Value Date   CHOL 153 08/30/2021   TRIG 70 08/30/2021   HDL 58 08/30/2021   CHOLHDL 2.6 08/30/2021   VLDL 15 10/17/2015   LDLCALC 81 08/30/2021   LDLCALC 198 (H) 07/18/2021   Lab Results  Component Value Date   TSH 1.750 04/01/2018   TSH 1.312  10/26/2014    Therapeutic Level Labs: No results found for: "LITHIUM" No results found for: "VALPROATE" No results found for: "CBMZ"  Screenings: AIMS    Flowsheet Row [facility]  AIMS Total Score 0      GAD-7    Flowsheet Row [facility]20[facility] Video [facility] Clinical Support [facility]  Total GAD-7 Score 1 0 0 1 1      PHQ2-9    Flowsheet Row [facility] Clinical Support [facility]  PHQ-2 Total Score 0 0 1 0 0  PHQ-9 Total Score 0 1 1 0 --       Collaboration of Care: Collaboration of Care: Medication Management AEB ongoing medication management and Psychiatrist AEB established with this provider  Patient/Guardian was advised Release of Information must be obtained prior to any record release in order to collaborate their care with an outside provider. Patient/Guardian was advised if they have not already done so to contact the registration department to sign all necessary forms in order for us to release information regarding their care.   Consent: Patient/Guardian gives verbal consent for treatment and assignment of benefits for services provided during this visit. Patient/Guardian expressed understanding and agreed to proceed.   A total of 25 minutes was spent involved in face to  face clinical care, chart review, documentation, and medication management and counseling.   Birdell Frasier A  06/19/2022, 12:44 PM

## 2022-06-19 ENCOUNTER — Encounter (HOSPITAL_COMMUNITY): Payer: Self-pay | Admitting: Psychiatry

## 2022-06-19 ENCOUNTER — Ambulatory Visit (INDEPENDENT_AMBULATORY_CARE_PROVIDER_SITE_OTHER): Payer: No Payment, Other | Admitting: Psychiatry

## 2022-06-19 ENCOUNTER — Other Ambulatory Visit: Payer: Self-pay

## 2022-06-19 VITALS — BP 121/82 | HR 97 | Resp 16 | Ht 65.0 in | Wt 130.6 lb

## 2022-06-19 DIAGNOSIS — F251 Schizoaffective disorder, depressive type: Secondary | ICD-10-CM | POA: Diagnosis not present

## 2022-06-19 DIAGNOSIS — Z79899 Other long term (current) drug therapy: Secondary | ICD-10-CM | POA: Insufficient documentation

## 2022-06-19 DIAGNOSIS — E221 Hyperprolactinemia: Secondary | ICD-10-CM | POA: Diagnosis not present

## 2022-06-19 MED ORDER — ARIPIPRAZOLE 5 MG PO TABS
2.5000 mg | ORAL_TABLET | Freq: Every evening | ORAL | 3 refills | Status: DC
  Filled 2022-06-19 – 2022-07-11 (×2): qty 15, 30d supply, fill #0
  Filled 2022-08-08: qty 15, 30d supply, fill #1
  Filled 2022-09-04: qty 15, 30d supply, fill #2

## 2022-06-19 NOTE — Patient Instructions (Signed)
Thank you for attending your appointment today.  -- We did not make any medication changes today. Please continue medications as prescribed. -- Get the labs drawn at [facility] at your earliest convenience; make sure to take the lab forms with you.  Please do not make any changes to medications without first discussing with your provider. If you are experiencing a psychiatric emergency, please call 911 or present to your nearest emergency department. Additional crisis, medication management, and therapy resources are included below.  [facility]  [address] [place], [place] [address] [phone] WALK-IN URGENT CARE 24/7 FOR ANYONE [address] [place], [place]  [phone] Fax: [phone] [email or web address] *Interpreters available *Accepts all insurance and uninsured for Urgent Care needs *Accepts Medicaid and uninsured for outpatient treatment (below)      ONLY FOR [facility]  Below:    Outpatient New Patient Assessment/Therapy Walk-ins:        Monday -Thursday 8am until slots are full.        Every Friday 1pm-4pm  (first come, first served)                   New Patient Psychiatry/Medication Management        Monday-Friday 8am-11am (first come, first served)               For all walk-ins we ask that you arrive by 7:15am, because patients will be seen in the order of arrival.

## 2022-06-22 ENCOUNTER — Other Ambulatory Visit (HOSPITAL_COMMUNITY): Payer: Self-pay | Admitting: Student in an Organized Health Care Education/Training Program

## 2022-06-23 LAB — COMPREHENSIVE METABOLIC PANEL
ALT: 19 IU/L (ref 0–32)
AST: 25 IU/L (ref 0–40)
Albumin/Globulin Ratio: 1.6 (ref 1.2–2.2)
Albumin: 4.7 g/dL (ref 3.8–4.9)
Alkaline Phosphatase: 76 IU/L (ref 44–121)
BUN/Creatinine Ratio: 11 (ref 9–23)
BUN: 9 mg/dL (ref 6–24)
Bilirubin Total: 0.9 mg/dL (ref 0.0–1.2)
CO2: 24 mmol/L (ref 20–29)
Calcium: 10 mg/dL (ref 8.7–10.2)
Chloride: 102 mmol/L (ref 96–106)
Creatinine, Ser: 0.85 mg/dL (ref 0.57–1.00)
Globulin, Total: 2.9 g/dL (ref 1.5–4.5)
Glucose: 90 mg/dL (ref 70–99)
Potassium: 3.8 mmol/L (ref 3.5–5.2)
Sodium: 139 mmol/L (ref 134–144)
Total Protein: 7.6 g/dL (ref 6.0–8.5)
eGFR: 82 mL/min/{1.73_m2} (ref >59)

## 2022-06-23 LAB — PROLACTIN: Prolactin: 35.2 ng/mL — ABNORMAL HIGH (ref 3.6–25.2)

## 2022-06-23 LAB — TSH: TSH: 2.31 u[IU]/mL (ref 0.450–4.500)

## 2022-07-10 ENCOUNTER — Telehealth: Payer: Self-pay | Admitting: Internal Medicine

## 2022-07-10 NOTE — Telephone Encounter (Signed)
Recv'd pt's [facility] OC app - financial counseling sched Fri 07/13/22 at 9 AM; pt had initial hearing for disability and awaiting full hearing

## 2022-07-11 ENCOUNTER — Other Ambulatory Visit: Payer: Self-pay

## 2022-07-13 ENCOUNTER — Ambulatory Visit: Payer: Self-pay | Attending: Internal Medicine

## 2022-07-30 ENCOUNTER — Ambulatory Visit: Payer: Self-pay | Admitting: Internal Medicine

## 2022-07-31 ENCOUNTER — Other Ambulatory Visit: Payer: Self-pay

## 2022-07-31 ENCOUNTER — Ambulatory Visit: Payer: Medicaid Other

## 2022-07-31 ENCOUNTER — Ambulatory Visit: Payer: Medicaid Other | Attending: Internal Medicine | Admitting: Internal Medicine

## 2022-07-31 ENCOUNTER — Encounter: Payer: Self-pay | Admitting: Internal Medicine

## 2022-07-31 VITALS — BP 112/77 | HR 89 | Temp 98.0°F | Ht 65.0 in | Wt 134.0 lb

## 2022-07-31 DIAGNOSIS — E782 Mixed hyperlipidemia: Secondary | ICD-10-CM

## 2022-07-31 DIAGNOSIS — I10 Essential (primary) hypertension: Secondary | ICD-10-CM | POA: Diagnosis not present

## 2022-07-31 DIAGNOSIS — F251 Schizoaffective disorder, depressive type: Secondary | ICD-10-CM

## 2022-07-31 DIAGNOSIS — E221 Hyperprolactinemia: Secondary | ICD-10-CM

## 2022-07-31 DIAGNOSIS — Z2821 Immunization not carried out because of patient refusal: Secondary | ICD-10-CM | POA: Insufficient documentation

## 2022-07-31 DIAGNOSIS — Z1211 Encounter for screening for malignant neoplasm of colon: Secondary | ICD-10-CM

## 2022-07-31 MED ORDER — ATORVASTATIN CALCIUM 10 MG PO TABS
10.0000 mg | ORAL_TABLET | Freq: Every day | ORAL | 6 refills | Status: AC
Start: 2022-07-31 — End: ?
  Filled 2022-07-31: qty 90, 90d supply, fill #0
  Filled 2022-08-08: qty 30, 30d supply, fill #0
  Filled 2022-09-04: qty 30, 30d supply, fill #1
  Filled 2022-10-16: qty 30, 30d supply, fill #2
  Filled 2022-11-13: qty 30, 30d supply, fill #3

## 2022-07-31 NOTE — Progress Notes (Signed)
Patient ID: Sandra Bell, female    DOB: 12/06/1970  MRN: [id number]  CC: Hypertension (HTN f/u. /No questions/ concerns)   Subjective: Sandra Bell is a 52 y.o. female who presents for chronic ds management Her concerns today include:  Pt with hx of HTN, HL, schizoaffective disorder, Fhx of breast CA in sister (reportedly tested neg for BRCA), elev Prolactin level due to [place]   HYPERTENSION Currently taking: see medication list.  Norvasc 10 mg daily Med Adherence: [x]  Yes    []  No Medication side effects: []  Yes    [x]  No Adherence with salt restriction: [x]  Yes    []  No Home Monitoring?: [x]  Yes    []  No Monitoring Frequency:  once a wk Home BP results range: gives range 100-110/80s SOB? []  Yes    [x]  No Chest Pain?: []  Yes    [x]  No Leg swelling?: []  Yes    [x]  No Headaches?: []  Yes    [x]  No Dizziness? []  Yes    [x]  No Comments:   HL: taking and tolerating Lipitor 10 mg daily   Schizoaffective Ds:  still on Abilify.  Feels stable on this med. Prolactin level has continue to trend down.  Last level done 6 wks ago was 35.   In process of applying for disability and has hearing 10/2022.  Reports being told that if she is approved for disability she will be approved for Medicaid  HM:  Due for FIT.  Due for Tdapt.  Declines Tdapt Patient Active Problem List   Diagnosis Date Noted   High risk medication use 06/19/2022   Hyperprolactinemia (HCC) 03/27/2022   History of lump of left breast 05/18/2021   Family history of breast cancer 05/18/2021   History of hyperprolactinemia 05/18/2021   Breast lump 03/17/2020   Essential hypertension 02/05/2012   Schizoaffective disorder, depressive type (HCC) 01/03/2012   HLD (hyperlipidemia) 01/03/2012     Current Outpatient Medications on File Prior to Visit  Medication Sig Dispense Refill   amLODipine (NORVASC) 10 MG tablet Take 1 tablet (10 mg total) by mouth daily. 30 tablet 6   ARIPiprazole (ABILIFY) 5 MG tablet Take 0.5  tablets (2.5 mg total) by mouth at bedtime. 15 tablet 3   atorvastatin (LIPITOR) 10 MG tablet Take 1 tablet (10 mg total) by mouth daily. 30 tablet 6   aspirin 81 MG tablet Take 81 mg by mouth daily. (Patient not taking: Reported on 07/31/2022)     No current facility-administered medications on file prior to visit.    No Known Allergies  Social History   Socioeconomic History   Marital status: Divorced    Spouse name: Not on file   Number of children: Not on file   Years of education: Not on file   Highest education level: Professional school degree (e.g., MD, DDS, DVM, JD)  Occupational History   Not on file  Tobacco Use   Smoking status: Never   Smokeless tobacco: Never  Vaping Use   Vaping Use: Never used  Substance and Sexual Activity   Alcohol use: No   Drug use: No   Sexual activity: Not Currently  Other Topics Concern   Not on file  Social History Narrative   Not on file   Social Determinants of Health   Financial Resource Strain: Not on file  Food Insecurity: No Food Insecurity (06/29/2021)   Hunger Vital Sign    Worried About Running Out of Food in the Last Year: Never true  Ran Out of Food in the Last Year: Never true  Transportation Needs: No Transportation Needs (06/29/2021)   PRAPARE - Administrator, Civil Service (Medical): No    Lack of Transportation (Non-Medical): No  Physical Activity: Not on file  Stress: Not on file  Social Connections: Not on file  Intimate Partner Violence: Not on file    Family History  Problem Relation Age of Onset   Hyperlipidemia Mother    Hypertension Mother    Hyperlipidemia Father    Hypertension Father    Heart disease Father    Breast cancer Sister     Past Surgical History:  Procedure Laterality Date   FINGER SURGERY  1983    ROS: Review of Systems Negative except as stated above  PHYSICAL EXAM: BP 112/77 (BP Location: Left Arm, Patient Position: Sitting, Cuff Size: Normal)   Pulse 89    Temp 98 F (36.7 C) (Oral)   Ht 5\' 5"  (1.651 m)   Wt 134 lb (60.8 kg)   LMP  (LMP Unknown)   SpO2 100%   BMI 22.30 kg/m   Physical Exam  General appearance - alert, well appearing, and in no distress Mental status - normal mood, behavior, speech, dress, motor activity, and thought processes Neck - supple, no significant adenopathy Chest - clear to auscultation, no wheezes, rales or rhonchi, symmetric air entry Heart - normal rate, regular rhythm, normal S1, S2, no murmurs, rubs, clicks or gallops Extremities - peripheral pulses normal, no pedal edema, no clubbing or cyanosis      Latest Ref Rng & Units 06/22/2022    8:41 AM 08/30/2021    9:04 AM 04/12/2021    5:22 PM  CMP  Glucose 70 - 99 mg/dL 90   81   BUN 6 - 24 mg/dL 9   7   Creatinine 6.30 - 1.00 mg/dL 1.60   1.09   Sodium 323 - 144 mmol/L 139   142   Potassium 3.5 - 5.2 mmol/L 3.8   4.0   Chloride 96 - 106 mmol/L 102   102   CO2 20 - 29 mmol/L 24     Calcium 8.7 - 10.2 mg/dL 55.7   32.2   Total Protein 6.0 - 8.5 g/dL 7.6  7.5  7.9   Total Bilirubin 0.0 - 1.2 mg/dL 0.9  0.8  0.7   Alkaline Phos 44 - 121 IU/L 76  66  72   AST 0 - 40 IU/L 25  15  16    ALT 0 - 32 IU/L 19  13     Lipid Panel     Component Value Date/Time   CHOL 153 08/30/2021 0904   TRIG 70 08/30/2021 0904   HDL 58 08/30/2021 0904   CHOLHDL 2.6 08/30/2021 0904   CHOLHDL 2.7 10/17/2015 0959   VLDL 15 10/17/2015 0959   LDLCALC 81 08/30/2021 0904    CBC    Component Value Date/Time   WBC 4.1 09/25/2021 1210   WBC 5.5 11/22/2014 0850   RBC 4.24 09/25/2021 1210   RBC 4.10 11/22/2014 0850   HGB 13.0 09/25/2021 1210   HCT 39.2 09/25/2021 1210   PLT 367 09/25/2021 1210   MCV 93 09/25/2021 1210   MCH 30.7 09/25/2021 1210   MCH 32.4 11/22/2014 0850   MCHC 33.2 09/25/2021 1210   MCHC 33.8 11/22/2014 0850   RDW 12.5 09/25/2021 1210   LYMPHSABS 2.3 09/25/2021 1210   MONOABS 0.6 11/22/2014 0850   EOSABS 0.1  09/25/2021 1210   BASOSABS 0.0  09/25/2021 1210    ASSESSMENT AND PLAN: 1. Essential hypertension At goal. Continue Norvasc 10 mg daily  2. Mixed hyperlipidemia Last LDL was at goal.  LFTs nl.  Continue Lipitor - atorvastatin (LIPITOR) 10 MG tablet; Take 1 tablet (10 mg total) by mouth daily.  Dispense: 30 tablet; Refill: 6  3. Schizoaffective disorder, depressive type (HCC) Followed by [facility]  4. Hyperprolactinemia (HCC) Levels continue to decline  5. Tetanus, diphtheria, and acellular pertussis (Tdap) vaccination declined Recommended.  Pt declined.  6.  Colon CA screen FIT test given today.  Pt had question about cost; office manager not available to answer today.  She will take kit home and call back later in the week to speak with office manager to see if she can get cost.      Patient was given the opportunity to ask questions.  Patient verbalized understanding of the plan and was able to repeat key elements of the plan.   This documentation was completed using Paediatric nurse.  Any transcriptional errors are unintentional.  No orders of the defined types were placed in this encounter.    Requested Prescriptions    No prescriptions requested or ordered in this encounter    No follow-ups on file.  Jonah Blue, MD, FACP

## 2022-08-03 ENCOUNTER — Telehealth: Payer: Self-pay

## 2022-08-03 NOTE — Telephone Encounter (Signed)
Copied from CRM [id number]. Topic: General - Other >> Aug 03, 2022  8:43 AM Sandra Bell wrote: Reason for CRM: Pt states given Bell at home colonoscopy kit from her PCP and she is wanting to know if there is Bell fee for the at home kit. Per pt her PCP told her to call and ask if there will be Bell fee. Please call pt back.

## 2022-08-07 ENCOUNTER — Ambulatory Visit: Payer: Self-pay | Attending: Family Medicine

## 2022-08-07 ENCOUNTER — Other Ambulatory Visit: Payer: Self-pay

## 2022-08-08 ENCOUNTER — Other Ambulatory Visit: Payer: Self-pay

## 2022-08-08 ENCOUNTER — Telehealth: Payer: Self-pay | Admitting: Internal Medicine

## 2022-08-08 NOTE — Telephone Encounter (Signed)
Pt approved for Medicaid retroactive from 07/11/2022 - should pay for OV 07/31/2022; no need to proceed w/ [facility]

## 2022-08-08 NOTE — Telephone Encounter (Signed)
Patient called back to see if there is a fee for her colonoscopy kit. Please advise.

## 2022-08-08 NOTE — Telephone Encounter (Signed)
Spoke with patient . Verified name & DOB   Patient is reporting not insurance and not income. Directed patient to [facility] medicaid specialist located in the [facility]

## 2022-08-09 ENCOUNTER — Other Ambulatory Visit: Payer: Self-pay

## 2022-08-09 ENCOUNTER — Telehealth: Payer: Self-pay

## 2022-08-09 NOTE — Telephone Encounter (Signed)
Patient came in stating she got approved for medicaid she also stated she applied for food stamps, and they only gave her 3 months worth she requests for pcp to write a letter stating she is unable to work and she is wanting the letter to, list reasons why she is unable to work so the can extend her benefits.

## 2022-08-11 LAB — FECAL OCCULT BLOOD, IMMUNOCHEMICAL: Fecal Occult Bld: NEGATIVE

## 2022-08-13 ENCOUNTER — Telehealth: Payer: Self-pay

## 2022-08-13 DIAGNOSIS — Z1231 Encounter for screening mammogram for malignant neoplasm of breast: Secondary | ICD-10-CM

## 2022-08-13 NOTE — Addendum Note (Signed)
Addended by: Jonah Blue B on: 08/13/2022 01:26 PM   Modules accepted: Orders

## 2022-08-13 NOTE — Telephone Encounter (Signed)
Copied from CRM [id number]. Topic: General - Inquiry >> Aug 13, 2022  8:10 AM De Blanch wrote: Reason for CRM: Pt stated she received notice from the breast center that she needed to schedule a mammogram. Pt is requesting an order be sent to schedule her mammogram.  Please advise.

## 2022-08-13 NOTE — Telephone Encounter (Signed)
Called & spoke to the patient. Verified name & DOB. Informed that [facility] order has been placed and will be contacted to schedule an appointment.

## 2022-08-13 NOTE — Telephone Encounter (Signed)
Called & spoke to the patient. Verified name & DOB. Informed that letter is ready for pick-up. Patient expressed verbal understanding.  

## 2022-09-04 ENCOUNTER — Other Ambulatory Visit: Payer: Self-pay

## 2022-09-04 NOTE — Progress Notes (Unsigned)
BH MD Outpatient Progress Note  09/06/2022 10:06 AM Sandra Bell  MRN:  [id number]  Assessment:  Sandra Bell presents for follow-up evaluation in-person. Today, 09/06/22, patient reports continued self derogatory auditory hallucinations although denies related distress or dysfunction.  She reports stability of mood and opts to remain at current low dose of Abilify.  She denies any symptoms of hyperprolactinemia and reassuringly prolactin level has continued to downtrend.   RTC in 3 months in person.  Identifying Information: Sandra Bell is a 52 y.o. female with a history of schizoaffective disorder, HTN, and HLD who is an established patient with [facility] for management of schizoaffective disorder.   Plan:  # Schizoaffective disorder, depressive type Past medication trials: Invega PO; [name] Status of problem: stable Interventions: -- Continue Abilify 2.5 mg nightly (patient thus far has declined titration to higher dosing; prefers nighttime dosing) -- Patient has inquired into group home and supportive living options as she currently lives with her mother who is 67 years old; will plan to further discuss community resources at next visit  # Medication monitoring Interventions: -- Lipid profile 08/30/21 wnl -- Hgb A1c 01/25/22 wnl -- While on Invega, experienced elevated PRL levels: 142 (04/12/21) --> 108 (09/25/21) --> 50.6 (01/25/22) -> 35.2 (06/22/22)  -- Plan to recheck in 6 months  -- Last received Invega Trinza 819 mg on 05/30/21. On review of pharmaceutical information, half life ranges from 118-139 days for gluteal injection and it has been shown that release of drug can last as long as 18 months. It is possible that elevated although downtrending PRL level remains secondary to when patient was on [place] therapy. -- CMP, TSH 06/22/22 wnl  Patient was given contact information for behavioral health clinic and was instructed to call 911  for emergencies.   Subjective:  Chief Complaint:  Chief Complaint  Patient presents with   Medication Management    Interval History:   Patient reports she has been doing well. Reviewed recent labs. Reports she has changed her diet from vegetarian to pescatarian and reports more energy with this change.   Continues to take Abilify nightly; denies missed doses. Prefers nighttime dosing as it helps her sleep at night.  Voices are about the "same;" reports some days are easier in terms of ignoring the voices. Identifies sticking to a routine helps reduce impact from voices. Denies CAH; reports voices are derogatory. Able to use self-affirmations fairly successfully in challenging these voices.   Denies significant distress or dysfunction related to voices and would like to remain at low-dose of Abilify.   Describes mood as "content." Denies SI or HI, thoughts of aggression. Denies irritability or significant anxiety. Denies VH; paranoia.   Patient inquires into options for group homes or supportive living as she currently lives with her mom who is 64 yo. Previously received information about [employer]. Expresses interest in all-female living. Amenable to this writer presenting with resources at next visit.    Visit Diagnosis:    ICD-10-CM   1. Schizoaffective disorder, depressive type (HCC)  F25.1 ARIPiprazole (ABILIFY) 5 MG tablet    2. High risk medication use  Z79.899       Past Psychiatric History:  Diagnoses: schizoaffective disorder, depressive type Medication trials:  Invega PO (dizziness, abd pain, hyperPRL); Invega Trinza (hyperPRL with galactorrhea) Hospitalizations: last psychiatric hospitalization in 2011 Suicide attempts: denies SIB: denies Current access to guns: denies Substance use: denies use of etoh, tobacco, or illicit drugs Living: lives with  her mother; patient manages medications independently  Past Medical History:  Past Medical History:  Diagnosis  Date   Hypertension    Schizoaffective disorder (HCC) 2003    Past Surgical History:  Procedure Laterality Date   FINGER SURGERY  1983   Family Psychiatric History: denies  Family History:  Family History  Problem Relation Age of Onset   Hyperlipidemia Mother    Hypertension Mother    Hyperlipidemia Father    Hypertension Father    Heart disease Father    Breast cancer Sister     Social History:  Social History   Socioeconomic History   Marital status: Divorced    Spouse name: Not on file   Number of children: Not on file   Years of education: Not on file   Highest education level: Professional school degree (e.g., MD, DDS, DVM, JD)  Occupational History   Not on file  Tobacco Use   Smoking status: Never   Smokeless tobacco: Never  Vaping Use   Vaping Use: Never used  Substance and Sexual Activity   Alcohol use: No   Drug use: No   Sexual activity: Not Currently  Other Topics Concern   Not on file  Social History Narrative   Not on file   Social Determinants of Health   Financial Resource Strain: Not on file  Food Insecurity: No Food Insecurity (06/29/2021)   Hunger Vital Sign    Worried About Running Out of Food in the Last Year: Never true    Ran Out of Food in the Last Year: Never true  Transportation Needs: No Transportation Needs (06/29/2021)   PRAPARE - Administrator, Civil Service (Medical): No    Lack of Transportation (Non-Medical): No  Physical Activity: Not on file  Stress: Not on file  Social Connections: Not on file    Allergies: No Known Allergies  Current Medications: Current Outpatient Medications  Medication Sig Dispense Refill   amLODipine (NORVASC) 10 MG tablet Take 1 tablet (10 mg total) by mouth daily. 30 tablet 6   ARIPiprazole (ABILIFY) 5 MG tablet Take 0.5 tablets (2.5 mg total) by mouth at bedtime. 15 tablet 3   aspirin 81 MG tablet Take 81 mg by mouth daily. (Patient not taking: Reported on 07/31/2022)      atorvastatin (LIPITOR) 10 MG tablet Take 1 tablet (10 mg total) by mouth daily. 30 tablet 6   No current facility-administered medications for this visit.    ROS: Denies physical complaints; denies breast swelling/tenderness; galactorrhea    Objective:  Psychiatric Specialty Exam: There were no vitals taken for this visit.There is no height or weight on file to calculate BMI.  General Appearance: Casual, Well Groomed, and head scarf  Eye Contact:  Good  Speech:  Clear and Coherent and Normal Rate  Volume:  Normal  Mood:   "content"  Affect:  Constricted and Euthymic; pleasant  Thought Content:  Endorses daily self-derogatory AH that are unchanged from prior; denies CAH. Denies VH.    Suicidal Thoughts:  No  Homicidal Thoughts:  No  Thought Process:  Goal Directed and Linear  Orientation:  Full (Time, Place, and Person)    Memory:   Grossly intact  Judgment:  Good  Insight:  Fair  Concentration:  Concentration: Good  Recall:  NA  Fund of Knowledge: Good  Language: Good  Psychomotor Activity:  Normal  Akathisia:  No  AIMS (if indicated): not done  Assets:  Communication Skills Desire for Improvement Housing  Leisure Time Physical Health Social Support Transportation  ADL's:  Intact  Cognition: WNL  Sleep:  Good   PE: General: well-appearing; no acute distress  Pulm: no increased work of breathing on room air  Strength & Muscle Tone: within normal limits Neuro: no focal neurological deficits observed  Gait & Station: normal  Metabolic Disorder Labs: Lab Results  Component Value Date   HGBA1C 5.0 01/25/2022   Lab Results  Component Value Date   PROLACTIN 35.2 (H) 06/22/2022   PROLACTIN 50.6 (H) 01/25/2022   Lab Results  Component Value Date   CHOL 153 08/30/2021   TRIG 70 08/30/2021   HDL 58 08/30/2021   CHOLHDL 2.6 08/30/2021   VLDL 15 10/17/2015   LDLCALC 81 08/30/2021   LDLCALC 198 (H) 07/18/2021   Lab Results  Component Value Date   TSH 2.310  06/22/2022   TSH 1.750 04/01/2018    Therapeutic Level Labs: No results found for: "LITHIUM" No results found for: "VALPROATE" No results found for: "CBMZ"  Screenings: AIMS    Flowsheet Row Office Visit from 05/30/2021 in [facility]  AIMS Total Score 0      GAD-7    Flowsheet Row Office Visit from 07/31/2022 in [facility] Office Visit from 03/27/2022 in [facility] Video Visit from 09/25/2021 in [facility] Clinical Support from 08/29/2021 in [facility] Office Visit from 07/18/2021 in [facility]  Total GAD-7 Score 1 1 0 0 1      PHQ2-9    Flowsheet Row Office Visit from 07/31/2022 in [facility] Office Visit from 03/27/2022 in [facility] Video Visit from 09/25/2021 in [facility] Clinical Support from 08/29/2021 in [facility] Office Visit from 07/18/2021 in [facility]  PHQ-2 Total Score 0 0 0 1 0  PHQ-9 Total Score 0 0 1 1 0       Collaboration of Care: Collaboration of Care: Medication Management AEB ongoing medication management and Psychiatrist AEB established with this provider  Patient/Guardian was advised Release of Information must be obtained prior to any record release in order to collaborate their care with an outside provider. Patient/Guardian was advised if they have not already done so to contact the registration department to sign all necessary forms in order for [place] to release information regarding their care.   Consent: Patient/Guardian gives verbal consent for treatment and assignment of benefits for services provided during this visit. Patient/Guardian expressed understanding and agreed to proceed.   A total of 35  minutes was spent involved in face to face clinical care, chart review, documentation, and medication management and counseling.   Sundae Maners A  09/06/2022, 10:06 AM

## 2022-09-06 ENCOUNTER — Ambulatory Visit (INDEPENDENT_AMBULATORY_CARE_PROVIDER_SITE_OTHER): Payer: Medicaid Other | Admitting: Psychiatry

## 2022-09-06 ENCOUNTER — Other Ambulatory Visit: Payer: Self-pay

## 2022-09-06 ENCOUNTER — Encounter (HOSPITAL_COMMUNITY): Payer: Self-pay | Admitting: Psychiatry

## 2022-09-06 DIAGNOSIS — Z79899 Other long term (current) drug therapy: Secondary | ICD-10-CM | POA: Diagnosis not present

## 2022-09-06 DIAGNOSIS — F251 Schizoaffective disorder, depressive type: Secondary | ICD-10-CM | POA: Diagnosis not present

## 2022-09-06 MED ORDER — ARIPIPRAZOLE 5 MG PO TABS
2.5000 mg | ORAL_TABLET | Freq: Every evening | ORAL | 3 refills | Status: DC
Start: 2022-09-06 — End: 2022-12-04
  Filled 2022-10-16: qty 15, 30d supply, fill #0
  Filled 2022-11-13: qty 15, 30d supply, fill #1

## 2022-09-06 NOTE — Patient Instructions (Signed)
Thank you for attending your appointment today.  -- We did not make any medication changes today. Please continue medications as prescribed.  Please do not make any changes to medications without first discussing with your provider. If you are experiencing a psychiatric emergency, please call 911 or present to your nearest emergency department. Additional crisis, medication management, and therapy resources are included below.  [name]  [address], [place], [place] [address] [phone] WALK-IN URGENT CARE 24/7 FOR ANYONE [address], [place], [place]  [phone] Fax: [phone] [email or web address] *Interpreters available *Accepts all insurance and uninsured for Urgent Care needs *Accepts Medicaid and uninsured for outpatient treatment (below)      ONLY FOR [name] Residents  Below:    Outpatient New Patient Assessment/Therapy Walk-ins:        Monday -Thursday 8am until slots are full.        Every Friday 1pm-4pm  (first come, first served)                   New Patient Psychiatry/Medication Management        Monday-Friday 8am-11am (first come, first served)               For all walk-ins we ask that you arrive by 7:15am, because patients will be seen in the order of arrival.   

## 2022-09-10 ENCOUNTER — Other Ambulatory Visit: Payer: Self-pay

## 2022-09-12 ENCOUNTER — Other Ambulatory Visit: Payer: Self-pay

## 2022-09-18 ENCOUNTER — Encounter (HOSPITAL_COMMUNITY): Payer: No Payment, Other | Admitting: Psychiatry

## 2022-10-02 ENCOUNTER — Ambulatory Visit
Admission: RE | Admit: 2022-10-02 | Discharge: 2022-10-02 | Disposition: A | Discharge status: Home / Self Care | Payer: MEDICAID | Source: Ambulatory Visit | Attending: Internal Medicine | Admitting: Internal Medicine

## 2022-10-02 DIAGNOSIS — Z1231 Encounter for screening mammogram for malignant neoplasm of breast: Secondary | ICD-10-CM

## 2022-10-16 ENCOUNTER — Other Ambulatory Visit: Payer: Self-pay

## 2022-10-17 ENCOUNTER — Other Ambulatory Visit: Payer: Self-pay

## 2022-11-02 ENCOUNTER — Telehealth: Payer: Self-pay | Admitting: Internal Medicine

## 2022-11-02 DIAGNOSIS — Z0120 Encounter for dental examination and cleaning without abnormal findings: Secondary | ICD-10-CM

## 2022-11-02 NOTE — Telephone Encounter (Signed)
Please advise if patient needs an OV. Dental referral was place in 05/2021.

## 2022-11-02 NOTE — Telephone Encounter (Signed)
Referral Request - Has patient seen PCP for this complaint? No.  *If NO, is insurance requiring patient see PCP for this issue before PCP can refer them? No  Referral for which specialty: Dentist   Preferred provider/office:  The one that Dr Laural Benes referred her before, [name] on [address] in [place]  Reason for referral: chipped tooth   Patient callback # [phone]

## 2022-11-02 NOTE — Telephone Encounter (Signed)
Dental referral submitted.

## 2022-11-05 NOTE — Telephone Encounter (Signed)
Patient aware it could be over 2 weeks.  Will mail off  Medicaid Dental resources.

## 2022-11-13 ENCOUNTER — Other Ambulatory Visit: Payer: Self-pay

## 2022-11-13 ENCOUNTER — Other Ambulatory Visit: Payer: Self-pay | Admitting: Internal Medicine

## 2022-11-13 DIAGNOSIS — I10 Essential (primary) hypertension: Secondary | ICD-10-CM

## 2022-11-13 MED ORDER — AMLODIPINE BESYLATE 10 MG PO TABS
10.0000 mg | ORAL_TABLET | Freq: Every day | ORAL | 0 refills | Status: DC
  Filled 2022-11-13: qty 30, 30d supply, fill #0

## 2022-11-16 ENCOUNTER — Other Ambulatory Visit: Payer: Self-pay

## 2022-12-03 NOTE — Progress Notes (Unsigned)
BH MD Outpatient Progress Note  12/04/2022 1:22 PM Sandra Bell  MRN:  [id number]  Assessment:  Sandra Bell presents for follow-up evaluation in-person. Today, 12/04/22, patient reports overall psychiatric stability and denies signs/sx of depression or anxiety. She continues to experience daily self derogatory auditory hallucinations although denies related distress or dysfunction and feels she is fairly easily able to ignore AH. Discussed option to increase Abilify however she prefers to remain at current low dose of Abilify. Will plan to recheck prolactin level at next appointment. Patient expresses interest in exploring options for group homes/supported living and resources were discussed with patient and provided in AVS.  RTC in 3 months in person.  Identifying Information: Sandra Bell is Bell 52 y.o. female with Bell history of schizoaffective disorder, HTN, and HLD who is an established patient with [facility] for management of schizoaffective disorder.   Plan:  # Schizoaffective disorder, depressive type Past medication trials: Invega PO; Sandra Bell Status of problem: stable Interventions: -- Continue Abilify 2.5 mg nightly (patient thus far has declined titration to higher dosing; prefers nighttime dosing) -- Patient has inquired into group home and supportive living options as she currently lives with her mother who is 7 years old; resources for [employer], case management through Medicaid, and [employer] provided in AVS  # Medication monitoring Interventions: -- Lipid profile 08/30/21 wnl -- Hgb A1c 01/25/22 wnl -- While on Invega, experienced elevated PRL levels: 142 (04/12/21) --> 108 (09/25/21) --> 50.6 (01/25/22) -> 35.2 (06/22/22)  -- Plan to recheck at next visit  -- Last received Invega Trinza 819 mg on 05/30/21. On review of pharmaceutical information, half life ranges from 118-139 days for gluteal injection and it has been shown that  release of drug can last as long as 18 months. It is possible that elevated although downtrending PRL level remains secondary to when patient was on [place] therapy. -- CMP, TSH 06/22/22 wnl  Patient was given contact information for behavioral health clinic and was instructed to call 911 for emergencies.   Subjective:  Chief Complaint:  Chief Complaint  Patient presents with   Medication Management    Interval History:   Reports she continues to take Abilify as prescribed and notes "it's Bell good match for me." Doesn't impact energy or motivation. Describes mood as "calm, peaceful." Denies feeling depressed, irritable, or particularly anxious. Denies SI/HI. Appetite is stable - currently eating Bell plant based diet and has noticed improvement in blood pressure with dietary changes. Sleeping well with about 8 hours nightly and enjoys daytime routine of exercising (yoga/pilates) in the morning. Reports that initially voices interfered with exercising but lately has not been Bell problem. AH are still present but fairly easily able to distract herself from them. Continues to desire to remain at low dose of Abilify.   Remains interested in exploring options for supportive living as mom is older; may be looking into options in [place]. She was recently approved for Medicaid. Discussed resources including [place], [address], and case management through [place]. Resources provided in AVS.  Visit Diagnosis:    ICD-10-CM   1. Schizoaffective disorder, depressive type (HCC)  F25.1 ARIPiprazole (ABILIFY) 5 MG tablet       Past Psychiatric History:  Diagnoses: schizoaffective disorder, depressive type Medication trials:  Invega PO (dizziness, abd pain, hyperPRL); Invega Trinza (hyperPRL with galactorrhea) Hospitalizations: last psychiatric hospitalization in 2011 Suicide attempts: denies SIB: denies Current access to guns: denies Substance use: denies use of etoh,  tobacco, or  illicit drugs Living: lives with her mother; patient manages medications independently  Past Medical History:  Past Medical History:  Diagnosis Date   Hypertension    Schizoaffective disorder (HCC) 2003    Past Surgical History:  Procedure Laterality Date   FINGER SURGERY  1983   Family Psychiatric History: denies  Family History:  Family History  Problem Relation Age of Onset   Hyperlipidemia Mother    Hypertension Mother    Hyperlipidemia Father    Hypertension Father    Heart disease Father    Breast cancer Sister     Social History:  Social History   Socioeconomic History   Marital status: Divorced    Spouse name: Not on file   Number of children: Not on file   Years of education: Not on file   Highest education level: Professional school degree (e.g., MD, DDS, DVM, JD)  Occupational History   Not on file  Tobacco Use   Smoking status: Never   Smokeless tobacco: Never  Vaping Use   Vaping status: Never Used  Substance and Sexual Activity   Alcohol use: No   Drug use: No   Sexual activity: Not Currently  Other Topics Concern   Not on file  Social History Narrative   Not on file   Social Determinants of Health   Financial Resource Strain: Not on file  Food Insecurity: No Food Insecurity (06/29/2021)   Hunger Vital Sign    Worried About Running Out of Food in the Last Year: Never true    Ran Out of Food in the Last Year: Never true  Transportation Needs: No Transportation Needs (06/29/2021)   PRAPARE - Administrator, Civil Service (Medical): No    Lack of Transportation (Non-Medical): No  Physical Activity: Not on file  Stress: Not on file  Social Connections: Not on file    Allergies: No Known Allergies  Current Medications: Current Outpatient Medications  Medication Sig Dispense Refill   amLODipine (NORVASC) 10 MG tablet Take 1 tablet (10 mg total) by mouth daily. 30 tablet 0   atorvastatin (LIPITOR) 10 MG tablet Take 1 tablet (10  mg total) by mouth daily. 30 tablet 6   ARIPiprazole (ABILIFY) 5 MG tablet Take 0.5 tablets (2.5 mg total) by mouth at bedtime. 15 tablet 3   No current facility-administered medications for this visit.    ROS: Denies physical complaints  Objective:  Psychiatric Specialty Exam: There were no vitals taken for this visit.There is no height or weight on file to calculate BMI.  General Appearance: Casual, Well Groomed, and wearing thick glasses wrapped in yarn  Eye Contact:  Good  Speech:  Clear and Coherent and Normal Rate  Volume:  Normal  Mood:   "calm"  Affect:  Constricted and Euthymic; pleasant  Thought Content:  Endorses daily self-derogatory AH that are unchanged from prior; denies CAH. Denies VH.    Suicidal Thoughts:  No  Homicidal Thoughts:  No  Thought Process:  Goal Directed and Linear  Orientation:  Full (Time, Place, and Person)    Memory:   Grossly intact  Judgment:  Good  Insight:  Fair  Concentration:  Concentration: Good  Recall:  NA  Fund of Knowledge: Good  Language: Good  Psychomotor Activity:  Normal  Akathisia:  No  AIMS (if indicated): not done  Assets:  Communication Skills Desire for Improvement Housing Leisure Time Physical Health Social Support Transportation  ADL's:  Intact  Cognition: WNL  Sleep:  Good   PE: General: well-appearing; no acute distress  Pulm: no increased work of breathing on room air  Strength & Muscle Tone: within normal limits Neuro: no focal neurological deficits observed  Gait & Station: normal  Metabolic Disorder Labs: Lab Results  Component Value Date   HGBA1C 5.0 01/25/2022   Lab Results  Component Value Date   PROLACTIN 35.2 (H) 06/22/2022   PROLACTIN 50.6 (H) 01/25/2022   Lab Results  Component Value Date   CHOL 153 08/30/2021   TRIG 70 08/30/2021   HDL 58 08/30/2021   CHOLHDL 2.6 08/30/2021   VLDL 15 10/17/2015   LDLCALC 81 08/30/2021   LDLCALC 198 (H) 07/18/2021   Lab Results  Component  Value Date   TSH 2.310 06/22/2022   TSH 1.750 04/01/2018    Therapeutic Level Labs: No results found for: "LITHIUM" No results found for: "VALPROATE" No results found for: "CBMZ"  Screenings: AIMS    Flowsheet Row Office Visit from 05/30/2021 in [facility]  AIMS Total Score 0      GAD-7    Flowsheet Row Office Visit from 07/31/2022 in [facility] Office Visit from 03/27/2022 in [facility] Video Visit from 09/25/2021 in [facility] Clinical Support from 08/29/2021 in [facility] Office Visit from 07/18/2021 in [facility]  Total GAD-7 Score 1 1 0 0 1      PHQ2-9    Flowsheet Row Office Visit from 07/31/2022 in [facility] Office Visit from 03/27/2022 in [facility] Video Visit from 09/25/2021 in [facility] Clinical Support from 08/29/2021 in [facility] Office Visit from 07/18/2021 in [facility]  PHQ-2 Total Score 0 0 0 1 0  PHQ-9 Total Score 0 0 1 1 0       Collaboration of Care: Collaboration of Care: Medication Management AEB ongoing medication management and Psychiatrist AEB established with this provider  Patient/Guardian was advised Release of Information must be obtained prior to any record release in order to collaborate their care with an outside provider. Patient/Guardian was advised if they have not already done so to contact the registration department to sign all necessary forms in order for [place] to release information regarding their care.   Consent: Patient/Guardian gives verbal consent for treatment and assignment of benefits for services provided during this visit. Patient/Guardian expressed understanding and agreed to  proceed.   Bell total of 25 minutes was spent involved in face to face clinical care, chart review, documentation, and medication management and counseling.   Sandra Bell  12/04/2022, 1:22 PM

## 2022-12-03 NOTE — Patient Instructions (Incomplete)
Thank you for attending your appointment today.  -- We did not make any medication changes today. Please continue medications as prescribed. -- Resources for supported living:  -- You can call your medicaid plan to get resources and ask if you are assigned a case manager.  -- There is also [employer] who have has community Teacher, music.  [email or web address] -- [facility] has group homes as well: [email or web address]  Please do not make any changes to medications without first discussing with your provider. If you are experiencing a psychiatric emergency, please call 911 or present to your nearest emergency department. Additional crisis, medication management, and therapy resources are included below.  [facility]  [address], [place], [place] [address] [phone] WALK-IN URGENT CARE 24/7 FOR ANYONE [address] [place], [place]  [phone] Fax: [phone] [email or web address] *Interpreters available *Accepts all insurance and uninsured for Urgent Care needs *Accepts Medicaid and uninsured for outpatient treatment (below)      ONLY FOR [facility]  Below:    Outpatient New Patient Assessment/Therapy Walk-ins:        Monday -Thursday 8am until slots are full.        Every Friday 1pm-4pm  (first come, first served)                   New Patient Psychiatry/Medication Management        Monday-Friday 8am-11am (first come, first served)               For all walk-ins we ask that you arrive by 7:15am, because patients will be seen in the order of arrival.

## 2022-12-04 ENCOUNTER — Ambulatory Visit (INDEPENDENT_AMBULATORY_CARE_PROVIDER_SITE_OTHER): Payer: No Payment, Other | Admitting: Psychiatry

## 2022-12-04 ENCOUNTER — Other Ambulatory Visit: Payer: Self-pay

## 2022-12-04 ENCOUNTER — Encounter (HOSPITAL_COMMUNITY): Payer: Self-pay | Admitting: Psychiatry

## 2022-12-04 VITALS — BP 132/79 | HR 86 | Ht 65.0 in | Wt 135.0 lb

## 2022-12-04 DIAGNOSIS — F251 Schizoaffective disorder, depressive type: Secondary | ICD-10-CM | POA: Diagnosis not present

## 2022-12-04 MED ORDER — ARIPIPRAZOLE 5 MG PO TABS
2.5000 mg | ORAL_TABLET | Freq: Every evening | ORAL | 3 refills | Status: DC
  Filled 2022-12-04 – 2022-12-17 (×3): qty 15, 30d supply, fill #0
  Filled 2023-01-14: qty 15, 30d supply, fill #1
  Filled 2023-02-11: qty 15, 30d supply, fill #2

## 2022-12-05 ENCOUNTER — Other Ambulatory Visit: Payer: Self-pay

## 2022-12-05 ENCOUNTER — Other Ambulatory Visit: Payer: Self-pay | Admitting: Internal Medicine

## 2022-12-05 DIAGNOSIS — I10 Essential (primary) hypertension: Secondary | ICD-10-CM

## 2022-12-05 MED ORDER — AMLODIPINE BESYLATE 10 MG PO TABS
10.0000 mg | ORAL_TABLET | Freq: Every day | ORAL | 0 refills | Status: AC
Start: 2022-12-05 — End: ?
  Filled 2022-12-05: qty 30, 30d supply, fill #0

## 2022-12-06 ENCOUNTER — Other Ambulatory Visit: Payer: Self-pay

## 2022-12-11 ENCOUNTER — Other Ambulatory Visit: Payer: Self-pay

## 2022-12-11 ENCOUNTER — Encounter: Payer: Self-pay | Admitting: Internal Medicine

## 2022-12-11 ENCOUNTER — Ambulatory Visit: Payer: MEDICAID | Attending: Internal Medicine | Admitting: Internal Medicine

## 2022-12-11 VITALS — BP 130/80 | HR 100 | Temp 98.2°F | Ht 65.0 in | Wt 139.0 lb

## 2022-12-11 DIAGNOSIS — E782 Mixed hyperlipidemia: Secondary | ICD-10-CM | POA: Diagnosis not present

## 2022-12-11 DIAGNOSIS — Z2821 Immunization not carried out because of patient refusal: Secondary | ICD-10-CM

## 2022-12-11 DIAGNOSIS — I10 Essential (primary) hypertension: Secondary | ICD-10-CM

## 2022-12-11 MED ORDER — AMLODIPINE BESYLATE 10 MG PO TABS
10.0000 mg | ORAL_TABLET | Freq: Every day | ORAL | 2 refills | Status: DC
  Filled 2022-12-11 – 2022-12-21 (×2): qty 90, 90d supply, fill #0
  Filled 2023-03-18: qty 90, 90d supply, fill #1
  Filled 2023-06-13: qty 90, 90d supply, fill #2

## 2022-12-11 MED ORDER — ATORVASTATIN CALCIUM 10 MG PO TABS
10.0000 mg | ORAL_TABLET | Freq: Every day | ORAL | 2 refills | Status: DC
  Filled 2022-12-11 – 2022-12-21 (×2): qty 90, 90d supply, fill #0
  Filled 2023-03-18: qty 90, 90d supply, fill #1
  Filled 2023-06-13: qty 90, 90d supply, fill #2

## 2022-12-11 NOTE — Progress Notes (Signed)
Patient ID: Sandra Bell, female    DOB: [birth year]  MRN: [id number]  CC: Hypertension (HTN f/u. Med refill. /No questions/ concern/No to flu vax)   Subjective: Sandra Bell is a 52 y.o. female who presents for chronic ds management. Her concerns today include:  Pt with hx of HTN, HL, schizoaffective disorder, Fhx of breast CA in sister (reportedly tested neg for BRCA), elev Prolactin level due to Invega   HM:  declines influenza,  COVID booster, Tdapt and Shingrix.   HYPERTENSION Currently taking: see medication list.  Norvasc 10 mg daily; takes at bedtime Med Adherence: [x]  Yes    []  No Medication side effects: []  Yes    [x]  No Adherence with salt restriction: [x]  Yes    []  No Home Monitoring?: [x]  Yes    []  No Monitoring Frequency:  2x/wk.  Has log book today Home BP results range: Recent readings:  124/83,  106/81, 117/82, 104/80, 113/84 SOB? []  Yes    [x]  No Chest Pain?: []  Yes    [x]  No Leg swelling?: []  Yes    [x]  No Headaches?: []  Yes    [x]  No Dizziness? []  Yes    [x]  No Comments:    HL: taking and tolerating Lipitor 10 mg daily    Patient Active Problem List   Diagnosis Date Noted   Tetanus, diphtheria, and acellular pertussis (Tdap) vaccination declined 07/31/2022   High risk medication use 06/19/2022   Hyperprolactinemia (HCC) 03/27/2022   History of lump of left breast 05/18/2021   Family history of breast cancer 05/18/2021   History of hyperprolactinemia 05/18/2021   Breast lump 03/17/2020   Essential hypertension 02/05/2012   Schizoaffective disorder, depressive type (HCC) 01/03/2012   HLD (hyperlipidemia) 01/03/2012     Current Outpatient Medications on File Prior to Visit  Medication Sig Dispense Refill   ARIPiprazole (ABILIFY) 5 MG tablet Take 0.5 tablets (2.5 mg total) by mouth at bedtime. 15 tablet 3   No current facility-administered medications on file prior to visit.    No Known Allergies  Social History   Socioeconomic History    Marital status: Divorced    Spouse name: Not on file   Number of children: Not on file   Years of education: Not on file   Highest education level: Professional school degree (e.g., MD, DDS, DVM, JD)  Occupational History   Not on file  Tobacco Use   Smoking status: Never   Smokeless tobacco: Never  Vaping Use   Vaping status: Never Used  Substance and Sexual Activity   Alcohol use: No   Drug use: No   Sexual activity: Not Currently  Other Topics Concern   Not on file  Social History Narrative   Not on file   Social Determinants of Health   Financial Resource Strain: Not on file  Food Insecurity: No Food Insecurity (06/29/2021)   Hunger Vital Sign    Worried About Running Out of Food in the Last Year: Never true    Ran Out of Food in the Last Year: Never true  Transportation Needs: No Transportation Needs (06/29/2021)   PRAPARE - Administrator, Civil Service (Medical): No    Lack of Transportation (Non-Medical): No  Physical Activity: Not on file  Stress: Not on file  Social Connections: Not on file  Intimate Partner Violence: Not on file    Family History  Problem Relation Age of Onset   Hyperlipidemia Mother    Hypertension Mother  Hyperlipidemia Father    Hypertension Father    Heart disease Father    Breast cancer Sister     Past Surgical History:  Procedure Laterality Date   FINGER SURGERY  1983    ROS: Review of Systems Negative except as stated above  PHYSICAL EXAM: BP 130/80   Pulse 100   Temp 98.2 F (36.8 C) (Oral)   Ht 5\' 5"  (1.651 m)   Wt 139 lb (63 kg)   LMP  (LMP Unknown)   SpO2 100%   BMI 23.13 kg/m   Physical Exam  General appearance - alert, well appearing, and in no distress Mental status - normal mood, behavior, speech, dress, motor activity, and thought processes Neck - supple, no significant adenopathy Chest - clear to auscultation, no wheezes, rales or rhonchi, symmetric air entry Heart - normal rate,  regular rhythm, normal S1, S2, no murmurs, rubs, clicks or gallops Extremities - peripheral pulses normal, trace pedal edema,      Latest Ref Rng & Units 06/22/2022    8:41 AM 08/30/2021    9:04 AM 04/12/2021    5:22 PM  CMP  Glucose 70 - 99 mg/dL 90   81   BUN 6 - 24 mg/dL 9   7   Creatinine 1.61 - 1.00 mg/dL 0.96   0.45   Sodium 409 - 144 mmol/L 139   142   Potassium 3.5 - 5.2 mmol/L 3.8   4.0   Chloride 96 - 106 mmol/L 102   102   CO2 20 - 29 mmol/L 24     Calcium 8.7 - 10.2 mg/dL 81.1   91.4   Total Protein 6.0 - 8.5 g/dL 7.6  7.5  7.9   Total Bilirubin 0.0 - 1.2 mg/dL 0.9  0.8  0.7   Alkaline Phos 44 - 121 IU/L 76  66  72   AST 0 - 40 IU/L 25  15  16    ALT 0 - 32 IU/L 19  13     Lipid Panel     Component Value Date/Time   CHOL 153 08/30/2021 0904   TRIG 70 08/30/2021 0904   HDL 58 08/30/2021 0904   CHOLHDL 2.6 08/30/2021 0904   CHOLHDL 2.7 10/17/2015 0959   VLDL 15 10/17/2015 0959   LDLCALC 81 08/30/2021 0904    CBC    Component Value Date/Time   WBC 4.1 09/25/2021 1210   WBC 5.5 11/22/2014 0850   RBC 4.24 09/25/2021 1210   RBC 4.10 11/22/2014 0850   HGB 13.0 09/25/2021 1210   HCT 39.2 09/25/2021 1210   PLT 367 09/25/2021 1210   MCV 93 09/25/2021 1210   MCH 30.7 09/25/2021 1210   MCH 32.4 11/22/2014 0850   MCHC 33.2 09/25/2021 1210   MCHC 33.8 11/22/2014 0850   RDW 12.5 09/25/2021 1210   LYMPHSABS 2.3 09/25/2021 1210   MONOABS 0.6 11/22/2014 0850   EOSABS 0.1 09/25/2021 1210   BASOSABS 0.0 09/25/2021 1210    ASSESSMENT AND PLAN: 1. Essential hypertension Diastolic blood pressure at home a little above goal.  Blood pressure today is good.  She will continue Norvasc 10 mg daily. - amLODipine (NORVASC) 10 MG tablet; Take 1 tablet (10 mg total) by mouth daily.  Dispense: 90 tablet; Refill: 2  2. Mixed hyperlipidemia Continue Lipitor - atorvastatin (LIPITOR) 10 MG tablet; Take 1 tablet (10 mg total) by mouth daily.  Dispense: 90 tablet; Refill: 2  3.  Influenza vaccination declined Recommended; pt declined.  4.  Tetanus, diphtheria, and acellular pertussis (Tdap) vaccination declined      Patient was given the opportunity to ask questions.  Patient verbalized understanding of the plan and was able to repeat key elements of the plan.   This documentation was completed using Paediatric nurse.  Any transcriptional errors are unintentional.  No orders of the defined types were placed in this encounter.    Requested Prescriptions   Signed Prescriptions Disp Refills   amLODipine (NORVASC) 10 MG tablet 90 tablet 2    Sig: Take 1 tablet (10 mg total) by mouth daily.   atorvastatin (LIPITOR) 10 MG tablet 90 tablet 2    Sig: Take 1 tablet (10 mg total) by mouth daily.    Return in about 4 months (around 04/13/2023).  Jonah Blue, MD, FACP

## 2022-12-17 ENCOUNTER — Other Ambulatory Visit (HOSPITAL_BASED_OUTPATIENT_CLINIC_OR_DEPARTMENT_OTHER): Payer: Self-pay

## 2022-12-17 ENCOUNTER — Other Ambulatory Visit: Payer: Self-pay

## 2022-12-21 ENCOUNTER — Other Ambulatory Visit: Payer: Self-pay

## 2023-01-11 ENCOUNTER — Telehealth (HOSPITAL_COMMUNITY): Payer: Self-pay | Admitting: Psychiatry

## 2023-01-11 ENCOUNTER — Telehealth: Payer: Self-pay | Admitting: Internal Medicine

## 2023-01-11 NOTE — Telephone Encounter (Signed)
Copied from CRM [id number]. Topic: General - Other >> Jan 11, 2023  1:17 PM Phill Myron wrote: Sandra Bell is in need of an FL2 form. As soon as possible for a space at a group home ([facility]itional Community Living). Please advise

## 2023-01-14 ENCOUNTER — Other Ambulatory Visit: Payer: Self-pay

## 2023-01-14 NOTE — Telephone Encounter (Signed)
Dr Laural Benes- [place] Childes   I spoke to the patient about her request for an FL2.   She explained that she is trying to get into a Adult Care Home/Group Home. She has been working with her Care Manager: Alycia Rossetti: [phone] who is requesting this document. The patient said she is not comfortable living independently and feels that she needs the extra support around her that would be provided in a group home setting.  She requests that the Fl2 be emailed to her care manager: [email or web address].   I told her that I will contact Aura Camps and will confirm email address.

## 2023-01-15 NOTE — Telephone Encounter (Signed)
I called Sandra Bell and had to leave a message requesting a call back.

## 2023-01-16 NOTE — Telephone Encounter (Signed)
Call received from [facility]. She explained that the [facility] is needed for the TCL program as she is trying to assist the patient in finding supportive housing.  I confirmed her email address and FL2 sent as requested: [email or web address].

## 2023-01-16 NOTE — Telephone Encounter (Signed)
I tried again to reach patient's Care Manager: Alycia Rossetti: [phone], message left with call back requested.

## 2023-01-17 ENCOUNTER — Ambulatory Visit (HOSPITAL_COMMUNITY): Payer: MEDICAID

## 2023-01-17 DIAGNOSIS — F251 Schizoaffective disorder, depressive type: Secondary | ICD-10-CM

## 2023-01-17 NOTE — Progress Notes (Signed)
Comprehensive Clinical Assessment (CCA) Note  01/17/2023 Sandra Bell [id number] Chief Complaint:  Chief Complaint  Patient presents with   Schizoaffective Disorder   Visit Diagnosis: Schizoaffective Disorder   CCA Screening, Triage and Referral (STR)  Patient Reported Information How did you hear about [place]? No data recorded Referral name: self  Referral phone number: No data recorded  Whom do you see for routine medical problems? Primary Care  Practice/Facility Name: [facility]  Practice/Facility Phone Number: No data recorded Name of Contact: Jonah Blue  Contact Number: No data recorded Contact Fax Number: No data recorded Prescriber Name: No data recorded Prescriber Address (if known): No data recorded  What Is the Reason for Your Visit/Call Today? No data recorded How Long Has This Been Causing You Problems? > than 6 months  What Do You Feel Would Help You the Most Today? -- (CCA to get into a mental health group home)   Have You Recently Been in Any Inpatient Treatment (Hospital/Detox/Crisis Center/28-Day Program)? No  Name/Location of Program/Hospital:No data recorded How Long Were You There? No data recorded When Were You Discharged? No data recorded  Have You Ever Received Services From [facility] Before? Yes  Who Do You See at [facility]? Dr. Samule Dry   Have You Recently Had Any Thoughts About Hurting Yourself? No  Are You Planning to Commit Suicide/Harm Yourself At This time? No   Have you Recently Had Thoughts About Hurting Someone Karolee Ohs? No  Explanation: No data recorded  Have You Used Any Alcohol or Drugs in the Past 24 Hours? No  How Long Ago Did You Use Drugs or Alcohol? No data recorded What Did You Use and How Much? No data recorded  Do You Currently Have a Therapist/Psychiatrist? Yes  Name of Therapist/Psychiatrist: Dr. Eulogio Bear   Have You Been Recently Discharged From Any Office Practice or  Programs? No data recorded Explanation of Discharge From Practice/Program: No data recorded    CCA Screening Triage Referral Assessment Type of Contact: Face-to-Face  Is this Initial or Reassessment? No data recorded Date Telepsych consult ordered in [facility]:  No data recorded Time Telepsych consult ordered in [facility]:  No data recorded  Patient Reported Information Reviewed? No data recorded Patient Left Without Being Seen? No data recorded Reason for Not Completing Assessment: No data recorded  Collateral Involvement: none   Does Patient Have a Court Appointed Legal Guardian? No data recorded Name and Contact of Legal Guardian: No data recorded If Minor and Not Living with Parent(s), Who has Custody? No data recorded Is CPS involved or ever been involved? -- (placed daughter for adoption when daughter was young in [place])  Is APS involved or ever been involved? Never   Patient Determined To Be At Risk for Harm To Self or Others Based on Review of Patient Reported Information or Presenting Complaint? No  Method: No Plan  Availability of Means: No access or NA  Intent: Vague intent or NA  Notification Required: No need or identified person  Additional Information for Danger to Others Potential: Active psychosis (auditory hallucinations)  Additional Comments for Danger to Others Potential: none no command hallucinations  Are There Guns or Other Weapons in Your Home? No  Types of Guns/Weapons: No data recorded Are These Weapons Safely Secured?                            No data recorded Who Could Verify You Are Able To  Have These Secured: No data recorded Do You Have any Outstanding Charges, Pending Court Dates, Parole/Probation? No data recorded Contacted To Inform of Risk of Harm To Self or Others: No data recorded  Location of Assessment: [facility]   Does Patient Present under Involuntary Commitment? No  IVC Papers Initial File Date: No data  recorded  [place] of Residence: [name]   Patient Currently Receiving the Following Services: No data recorded  Determination of Need: Routine (7 days)   Options For Referral: Other: Comment     CCA Biopsychosocial Intake/Chief Complaint:  Schizoaffective Disorder  Current Symptoms/Problems: halluciatntion: negative thoughts but not harmful ones, flat eaffect, irritability, low motivation Sandra Bell presents today for a CCA as she says she needs one to get into a mental health type group home. She says her care manager at [facility] is helping her locate a group home.  Sandra Bell carries the diagnosis of Schizoaffective Disorder and was originally referred to [facility]) by [employer].  Sandra Bell sees Dr. Rich Fuchs here at this facility for psychiatry.  Sandra Bell says her mother has been her care taker, however her mother is 25 years old and would like for Sandra Bell to be in a placement before she transitions from this life.  Patient Reported Schizophrenia/Schizoaffective Diagnosis in Past: Yes (at [facility])   Strengths: determined  Preferences: live in a group home  Abilities: sewing, learned to sew in [employer] school,  piano played from ages [age], finger crochet   Type of Services Patient Feels are Needed: med mgmt already has a provider   Initial Clinical Notes/Concerns: No data recorded  Mental Health Symptoms Depression:   Irritability (gets frustrated hearing voices)   Duration of Depressive symptoms:  Less than two weeks   Mania:  No data recorded  Anxiety:    None   Psychosis:   Hallucinations; Other negative symptoms   Duration of Psychotic symptoms:  Greater than six months   Trauma:   None   Obsessions:   None   Compulsions:   None   Inattention:   None   Hyperactivity/Impulsivity:   None   Oppositional/Defiant Behaviors:  No data recorded  Emotional Irregularity:   None   Other Mood/Personality Symptoms:  No data  recorded   Mental Status Exam Appearance and self-care  Stature:   Average   Weight:   Average weight   Clothing:   Casual   Grooming:   Well-groomed   Cosmetic use:   None   Posture/gait:   Normal   Motor activity:  No data recorded  Sensorium  Attention:   Normal   Concentration:   Normal   Orientation:   X5   Recall/memory:   Normal   Affect and Mood  Affect:  No data recorded  Mood:   Anxious (about coming here today)   Relating  Eye contact:   Normal   Facial expression:   Responsive   Attitude toward examiner:   Cooperative   Thought and Language  Speech flow:  Normal   Thought content:   Appropriate to Mood and Circumstances   Preoccupation:   Other (Comment)   Hallucinations:   Auditory   Organization:  No data recorded  [employer] of Knowledge:   Fair   Intelligence:   Average   Abstraction:   Normal   Judgement:   Fair   Dance movement psychotherapist:  No data recorded  Insight:   Fair   Decision Making:   Normal   Social  Functioning  Social Maturity:   Responsible   Social Judgement:   Normal   Stress  Stressors:   Housing   Coping Ability:   Normal   Skill Deficits:   None   Supports:   Church; Family; Friends/Service system     Religion: Religion/Spirituality Are You A Religious Person?: Yes 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 (reared [employer] but attends a [employer]) How Might This Affect Treatment?: none  Leisure/Recreation: Leisure / Recreation Do You Have Hobbies?: Yes Leisure and Hobbies: finger crocheting  Exercise/Diet: Exercise/Diet Do You Exercise?: No Have You Gained or Lost A Significant Amount of Weight in the Past Six Months?: No Do You Follow a Special Diet?: No Do You Have Any Trouble Sleeping?: No   CCA Employment/Education Employment/Work Situation: Employment / Work Situation Employment Situation: Unemployed (has been denied for  disability) Patient's Job has Been Impacted by Current Illness: Yes Describe how Patient's Job has Been Impacted: Pt is unable to work due to mental illness What is the Longest Time Patient has Held a Job?: 10 years 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?: No  Education: Education Is Patient Currently Attending School?: No Last Grade Completed: 12 Name of High School: [employer] Did Garment/textile technologist From [employer]?: Yes Did You Attend College?: Yes What Type of College Degree Do you Have?: 2 years but did not finish Did You Attend Graduate School?: No Did You Have An Individualized Education Program (IIEP): No Patient's Education Has Been Impacted by Current Illness: No   CCA Family/Childhood History Family and Relationship History: Family history Marital status: Divorced Divorced, when?: 2000 What is your sexual orientation?: heterosexual Has your sexual activity been affected by drugs, alcohol, medication, or emotional stress?: not applicable Does patient have children?: Yes How many children?: 1 (daughter than she adopted out when the daughter was young) How is patient's relationship with their children?: it was a closed adoption  Childhood History:  Childhood History By whom was/is the patient raised?: Both parents Description of patient's relationship with caregiver when they were a child: good Patient's description of current relationship with people who raised him/her: good How were you disciplined when you got in trouble as a child/adolescent?: would be lectured by father Does patient have siblings?: Yes Number of Siblings: 4 (3 are living) Description of patient's current relationship with siblings: good Did patient suffer any verbal/emotional/physical/sexual abuse as a child?: No Did patient suffer from severe childhood neglect?: No Has patient ever been sexually abused/assaulted/raped  as an adolescent or adult?: No Was the patient ever a victim of a crime or a disaster?: No Witnessed domestic violence?: No  Child/Adolescent Assessment:     CCA Substance Use Alcohol/Drug Use: Alcohol / Drug Use Pain Medications: No use of any substances                         ASAM's:  Six Dimensions of Multidimensional Assessment  Dimension 1:  Acute Intoxication and/or Withdrawal Potential:      Dimension 2:  Biomedical Conditions and Complications:      Dimension 3:  Emotional, Behavioral, or Cognitive Conditions and Complications:     Dimension 4:  Readiness to Change:     Dimension 5:  Relapse, Continued use, or Continued Problem Potential:     Dimension 6:  Recovery/Living Environment:     ASAM Severity Score:    ASAM  Recommended Level of Treatment:     Substance use Disorder (SUD)    Recommendations for Services/Supports/Treatments: Recommendations for Services/Supports/Treatments Recommendations For Services/Supports/Treatments: Other (Comment)  DSM5 Diagnoses: Patient Active Problem List   Diagnosis Date Noted   Tetanus, diphtheria, and acellular pertussis (Tdap) vaccination declined 07/31/2022   High risk medication use 06/19/2022   Hyperprolactinemia (HCC) 03/27/2022   History of lump of left breast 05/18/2021   Family history of breast cancer 05/18/2021   History of hyperprolactinemia 05/18/2021   Breast lump 03/17/2020   Essential hypertension 02/05/2012   Schizoaffective disorder, depressive type (HCC) 01/03/2012   HLD (hyperlipidemia) 01/03/2012    Patient Centered Plan: Patient is on the following Treatment Plan(s):  Continue to see Dr. Rich Fuchs for medication management of her Schizoaffective Disorder   Darlin signs a ROI to obtain a copy of the CCA and a ROI to send a copy to her care manager at [facility].   Referrals to Alternative Service(s): Referred to Alternative Service(s):   Place:   Date:   Time:    Referred to  Alternative Service(s):   Place:   Date:   Time:    Referred to Alternative Service(s):   Place:   Date:   Time:    Referred to Alternative Service(s):   Place:   Date:   Time:      Collaboration of Care: N/A  Patient/Guardian was advised Release of Information must be obtained prior to any record release in order to collaborate their care with an outside provider. Patient/Guardian was advised if they have not already done so to contact the registration department to sign all necessary forms in order for [place] to release information regarding their care.   Consent: Patient/Guardian gives verbal consent for treatment and assignment of benefits for services provided during this visit. Patient/Guardian expressed understanding and agreed to proceed.   Remigio Eisenmenger, MS, LMFT, LCAS  01-17-2023

## 2023-01-18 ENCOUNTER — Telehealth (HOSPITAL_COMMUNITY): Payer: Self-pay

## 2023-01-18 ENCOUNTER — Other Ambulatory Visit: Payer: Self-pay

## 2023-01-18 NOTE — Telephone Encounter (Signed)
Secure emailed Alycia Rossetti, care manager with [facility] the CCA on this patient, dated 01/17/23.  Therapist obtained a ROI and it will be scanned into the system.  Remigio Eisenmenger, MS, LMFT, LCAS 01/17/23

## 2023-01-24 ENCOUNTER — Ambulatory Visit (HOSPITAL_COMMUNITY): Payer: No Payment, Other

## 2023-01-28 ENCOUNTER — Telehealth (HOSPITAL_COMMUNITY): Payer: No Payment, Other | Admitting: Psychiatry

## 2023-02-11 ENCOUNTER — Other Ambulatory Visit: Payer: Self-pay

## 2023-02-15 ENCOUNTER — Other Ambulatory Visit: Payer: Self-pay

## 2023-02-25 NOTE — Progress Notes (Signed)
BH MD Outpatient Progress Note  02/26/2023 2:26 PM Sandra Bell  MRN:  [id number]  Assessment:  Sandra Bell presents for follow-up evaluation in-person. Today, 02/26/23, patient reports continued psychiatric stability and denies significant signs or symptoms of depression or anxiety.  She continues to experience daily self derogatory auditory hallucinations although feels she is able to manage these experiences relatively effectively with the use of meditation and mindfulness.  She denies significant distress or dysfunction related to Sandra Bell and prefers to remain at current low dose of Abilify.  Will order repeat prolactin as well as other annual screening labs as below.  Patient has since connected with Medicaid case manager to discuss options for assisted living and has appointment scheduled in early January to discuss supports through [place].  RTC in 3 months in person.  Identifying Information: Sandra Bell is a 52 y.o. female with a history of schizoaffective disorder, HTN, and HLD who is an established patient with [facility] for management of schizoaffective disorder.   Plan:  # Schizoaffective disorder, depressive type Past medication trials: Invega PO; [name] Status of problem: stable Interventions: -- Continue Abilify 2.5 mg nightly (patient thus far has declined titration to higher dosing; prefers nighttime dosing) -- Patient has inquired into group home and supportive living options as she currently lives with her mother who is 54 years old; resources for [employer], case management through Medicaid, and [employer] provided in AVS  # Medication monitoring Interventions: -- Lipid profile 08/30/21 wnl -- Hgb A1c 01/25/22 wnl -- While on Invega, experienced elevated PRL levels: 142 (04/12/21) --> 108 (09/25/21) --> 50.6 (01/25/22) -> 35.2 (06/22/22)  -- Repeat prolactin ordered  -- Last received Invega Trinza 819 mg on 05/30/21. On review  of pharmaceutical information, half life ranges from 118-139 days for gluteal injection and it has been shown that release of drug can last as long as 18 months. It is possible that elevated although downtrending PRL level remains secondary to when patient was on [place] therapy. -- CMP, TSH 06/22/22 wnl -- Will order repeat lipid profile, A1c, CBC, and vitamin D (due to restricted diet)  Patient was given contact information for behavioral health clinic and was instructed to call 911 for emergencies.   Subjective:  Chief Complaint:  Chief Complaint  Patient presents with   Medication Management    Interval History:   Looking forward to family coming to visit for the holidays. Continues to find low dose Abilify helpful and feels this is the best fit for her. Describes mood as "good" and denies feeling persistently down or depressed; particularly anxious or irritable. AH are about the "same" and describes as bullying/taunting - does not find them bothersome enough to increase Abilify. Has found it helpful to meditate in order not to "react" to the voices.   Sleeping well with about 8 hours nightly; starts her mornings early and likes having quiet time in the mornings to participate in yoga and pilates.  Denies side effects to Abilify or missed doses.   Has continued to look into group home; is now connected with a Medicaid care manager. Completed CCA with therapist here and PCP completed FL2. Expecting a call from [place] on 03/18/23 to discuss available services.  No questions/concerns at this time and amenable to continuing Abilify as prescribed.  Discussed need for recheck of prolactin and will plan to obtain labs at [facility].  Visit Diagnosis:    ICD-10-CM   1. Schizoaffective disorder, depressive type (HCC)  F25.1 Vitamin D (25 hydroxy)    ARIPiprazole (ABILIFY) 5 MG tablet    2. High risk medication use  Z79.899 Prolactin    CBC w/Diff/Platelet    Lipid panel    HgB A1c     3. History of hyperprolactinemia  Z86.39      Past Psychiatric History:  Diagnoses: schizoaffective disorder, depressive type Medication trials:  Invega PO (dizziness, abd pain, hyperPRL); Invega Trinza (hyperPRL with galactorrhea) Hospitalizations: last psychiatric hospitalization in 2011 Suicide attempts: denies SIB: denies Current access to guns: denies Substance use: denies use of etoh, tobacco, or illicit drugs Living: lives with her mother; patient manages medications independently  Past Medical History:  Past Medical History:  Diagnosis Date   Hypertension    Schizoaffective disorder (HCC) 2003    Past Surgical History:  Procedure Laterality Date   FINGER SURGERY  1983   Family Psychiatric History: denies  Family History:  Family History  Problem Relation Age of Onset   Hyperlipidemia Mother    Hypertension Mother    Hyperlipidemia Father    Hypertension Father    Heart disease Father    Breast cancer Sister     Social History:  Social History   Socioeconomic History   Marital status: Divorced    Spouse name: Not on file   Number of children: Not on file   Years of education: Not on file   Highest education level: Professional school degree (e.g., MD, DDS, DVM, JD)  Occupational History   Not on file  Tobacco Use   Smoking status: Never   Smokeless tobacco: Never  Vaping Use   Vaping status: Never Used  Substance and Sexual Activity   Alcohol use: No   Drug use: No   Sexual activity: Not Currently  Other Topics Concern   Not on file  Social History Narrative   Not on file   Social Drivers of Health   Financial Resource Strain: Not on file  Food Insecurity: No Food Insecurity (06/29/2021)   Hunger Vital Sign    Worried About Running Out of Food in the Last Year: Never true    Ran Out of Food in the Last Year: Never true  Transportation Needs: No Transportation Needs (06/29/2021)   PRAPARE - Administrator, Civil Service (Medical):  No    Lack of Transportation (Non-Medical): No  Physical Activity: Not on file  Stress: Not on file  Social Connections: Not on file    Allergies: No Known Allergies  Current Medications: Current Outpatient Medications  Medication Sig Dispense Refill   amLODipine (NORVASC) 10 MG tablet Take 1 tablet (10 mg total) by mouth daily. 90 tablet 2   ARIPiprazole (ABILIFY) 5 MG tablet Take 0.5 tablets (2.5 mg total) by mouth at bedtime. 15 tablet 3   atorvastatin (LIPITOR) 10 MG tablet Take 1 tablet (10 mg total) by mouth daily. 90 tablet 2   No current facility-administered medications for this visit.    ROS: Denies physical complaints  Objective:  Psychiatric Specialty Exam: There were no vitals taken for this visit.There is no height or weight on file to calculate BMI.  General Appearance: Casual and wearing multiple layers as well as thick glasses wrapped in yarn  Eye Contact:  Good  Speech:  Clear and Coherent and Normal Rate  Volume:  Normal  Mood:   "Good"  Affect:  Constricted and Euthymic; pleasant  Thought Content:  Endorses daily self-derogatory AH that are unchanged from prior; denies CAH. Denies  VH.    Suicidal Thoughts:  No  Homicidal Thoughts:  No  Thought Process:  Goal Directed and Linear  Orientation:  Full (Time, Place, and Person)    Memory:   Grossly intact  Judgment:  Good  Insight:  Fair  Concentration:  Concentration: Good  Recall:  NA  Fund of Knowledge: Good  Language: Good  Psychomotor Activity:  Normal  Akathisia:  No  AIMS (if indicated): not done  Assets:  Communication Skills Desire for Improvement Housing Leisure Time Physical Health Social Support Transportation  ADL's:  Intact  Cognition: WNL  Sleep:  Good   PE: General: well-appearing; no acute distress  Pulm: no increased work of breathing on room air  Strength & Muscle Tone: within normal limits Neuro: no focal neurological deficits observed  Gait & Station:  normal  Metabolic Disorder Labs: Lab Results  Component Value Date   HGBA1C 5.0 01/25/2022   Lab Results  Component Value Date   PROLACTIN 35.2 (H) 06/22/2022   PROLACTIN 50.6 (H) 01/25/2022   Lab Results  Component Value Date   CHOL 153 08/30/2021   TRIG 70 08/30/2021   HDL 58 08/30/2021   CHOLHDL 2.6 08/30/2021   VLDL 15 10/17/2015   LDLCALC 81 08/30/2021   LDLCALC 198 (H) 07/18/2021   Lab Results  Component Value Date   TSH 2.310 06/22/2022   TSH 1.750 04/01/2018    Therapeutic Level Labs: No results found for: "LITHIUM" No results found for: "VALPROATE" No results found for: "CBMZ"  Screenings: AIMS    Flowsheet Row Office Visit from 05/30/2021 in [facility]  AIMS Total Score 0      GAD-7    Flowsheet Row Counselor from 01/17/2023 in [facility] Office Visit from 12/11/2022 in [facility] - A Dept Of [place]. [facility] Office Visit from 07/31/2022 in [facility] - A Dept Of Eligha Bridegroom. [facility] Office Visit from 03/27/2022 in [facility] A Dept Of Eligha Bridegroom. [facility] Video Visit from 09/25/2021 in [facility]  Total GAD-7 Score 0 0 1 1 0      PHQ2-9    Flowsheet Row Counselor from 01/17/2023 in [facility] Office Visit from 12/11/2022 in [facility] [place] - A Dept Of [place]. [facility] Office Visit from 07/31/2022 in [facility] - A Dept Of Eligha Bridegroom. [facility] Office Visit from 03/27/2022 in [facility] [place] - A Dept Of Eligha Bridegroom. [facility] Video Visit from 09/25/2021 in [facility]  PHQ-2 Total Score 0 0 0 0 0  PHQ-9 Total Score -- 0 0 0 1      Flowsheet Row Counselor from 01/17/2023 in [facility]  C-SSRS RISK  CATEGORY No Risk       Collaboration of Care: Collaboration of Care: Medication Management AEB ongoing medication management and Psychiatrist AEB established with this provider  Patient/Guardian was advised Release of Information must be obtained prior to any record release in order to collaborate their care with an outside provider. Patient/Guardian was advised if they have not already done so to contact the registration department to sign all necessary forms in order for [place] to release information regarding their care.   Consent: Patient/Guardian gives verbal consent for treatment and assignment of benefits  for services provided during this visit. Patient/Guardian expressed understanding and agreed to proceed.   A total of 30 minutes was spent involved in face to face clinical care, chart review, documentation, and medication management and counseling.   Shakayla Hickox A Shelbe Haglund 02/26/2023, 2:26 PM

## 2023-02-26 ENCOUNTER — Encounter (HOSPITAL_COMMUNITY): Payer: Self-pay | Admitting: Psychiatry

## 2023-02-26 ENCOUNTER — Other Ambulatory Visit: Payer: Self-pay

## 2023-02-26 ENCOUNTER — Encounter (HOSPITAL_COMMUNITY): Payer: No Payment, Other | Admitting: Psychiatry

## 2023-02-26 ENCOUNTER — Ambulatory Visit (INDEPENDENT_AMBULATORY_CARE_PROVIDER_SITE_OTHER): Payer: MEDICAID | Admitting: Psychiatry

## 2023-02-26 VITALS — BP 134/82 | HR 100 | Wt 135.4 lb

## 2023-02-26 DIAGNOSIS — Z8639 Personal history of other endocrine, nutritional and metabolic disease: Secondary | ICD-10-CM

## 2023-02-26 DIAGNOSIS — Z79899 Other long term (current) drug therapy: Secondary | ICD-10-CM

## 2023-02-26 DIAGNOSIS — F251 Schizoaffective disorder, depressive type: Secondary | ICD-10-CM

## 2023-02-26 MED ORDER — ARIPIPRAZOLE 5 MG PO TABS
2.5000 mg | ORAL_TABLET | Freq: Every evening | ORAL | 3 refills | Status: DC
  Filled 2023-02-26 – 2023-03-18 (×2): qty 15, 30d supply, fill #0
  Filled 2023-04-15: qty 15, 30d supply, fill #1
  Filled 2023-05-15: qty 15, 30d supply, fill #2

## 2023-02-26 NOTE — Patient Instructions (Signed)

## 2023-02-27 ENCOUNTER — Other Ambulatory Visit (HOSPITAL_COMMUNITY): Payer: Self-pay | Admitting: Psychiatry

## 2023-02-28 LAB — LIPID PANEL
Chol/HDL Ratio: 1.9 {ratio} (ref 0.0–4.4)
Cholesterol, Total: 172 mg/dL (ref 100–199)
HDL: 90 mg/dL (ref >39)
LDL Chol Calc (NIH): 70 mg/dL (ref 0–99)
Triglycerides: 64 mg/dL (ref 0–149)
VLDL Cholesterol Cal: 12 mg/dL (ref 5–40)

## 2023-02-28 LAB — HEMOGLOBIN A1C
Est. average glucose Bld gHb Est-mCnc: 94 mg/dL
Hgb A1c MFr Bld: 4.9 % (ref 4.8–5.6)

## 2023-02-28 LAB — CBC WITH DIFFERENTIAL/PLATELET
Basophils Absolute: 0 10*3/uL (ref 0.0–0.2)
Basos: 1 %
EOS (ABSOLUTE): 0 10*3/uL (ref 0.0–0.4)
Eos: 1 %
Hematocrit: 38.5 % (ref 34.0–46.6)
Hemoglobin: 12.6 g/dL (ref 11.1–15.9)
Immature Grans (Abs): 0 10*3/uL (ref 0.0–0.1)
Immature Granulocytes: 0 %
Lymphocytes Absolute: 2.6 10*3/uL (ref 0.7–3.1)
Lymphs: 67 %
MCH: 31.1 pg (ref 26.6–33.0)
MCHC: 32.7 g/dL (ref 31.5–35.7)
MCV: 95 fL (ref 79–97)
Monocytes Absolute: 0.3 10*3/uL (ref 0.1–0.9)
Monocytes: 9 %
Neutrophils Absolute: 0.8 10*3/uL — ABNORMAL LOW (ref 1.4–7.0)
Neutrophils: 22 %
Platelets: 361 10*3/uL (ref 150–450)
RBC: 4.05 x10E6/uL (ref 3.77–5.28)
RDW: 11.7 % (ref 11.7–15.4)
WBC: 3.8 10*3/uL (ref 3.4–10.8)

## 2023-02-28 LAB — PROLACTIN: Prolactin: 23.2 ng/mL (ref 3.6–25.2)

## 2023-02-28 LAB — VITAMIN D 25 HYDROXY (VIT D DEFICIENCY, FRACTURES): Vit D, 25-Hydroxy: 8.1 ng/mL — ABNORMAL LOW (ref 30.0–100.0)

## 2023-03-18 ENCOUNTER — Other Ambulatory Visit: Payer: Self-pay

## 2023-03-21 ENCOUNTER — Other Ambulatory Visit: Payer: Self-pay

## 2023-04-15 ENCOUNTER — Other Ambulatory Visit: Payer: Self-pay

## 2023-04-16 ENCOUNTER — Other Ambulatory Visit: Payer: Self-pay

## 2023-04-16 ENCOUNTER — Encounter: Payer: Self-pay | Admitting: Internal Medicine

## 2023-04-16 ENCOUNTER — Ambulatory Visit: Payer: MEDICAID | Attending: Internal Medicine | Admitting: Internal Medicine

## 2023-04-16 VITALS — BP 132/84 | HR 93 | Ht 65.0 in | Wt 135.0 lb

## 2023-04-16 DIAGNOSIS — E559 Vitamin D deficiency, unspecified: Secondary | ICD-10-CM

## 2023-04-16 DIAGNOSIS — E782 Mixed hyperlipidemia: Secondary | ICD-10-CM

## 2023-04-16 DIAGNOSIS — Z2821 Immunization not carried out because of patient refusal: Secondary | ICD-10-CM | POA: Diagnosis not present

## 2023-04-16 DIAGNOSIS — I10 Essential (primary) hypertension: Secondary | ICD-10-CM

## 2023-04-16 NOTE — Progress Notes (Signed)
 Patient ID: Sandra Bell, female    DOB: [birth year]  MRN: [id number]  CC: Hypertension (HTN f/u./No questions / concerns/No to Tdap vax.)   Subjective: Sandra Bell is a 53 y.o. female who presents for chronic ds management. Her concerns today include:  Pt with hx of HTN, HL, schizoaffective disorder, Fhx of breast CA in sister (reportedly tested neg for BRCA), elev Prolactin level due to Invega    HYPERTENSION Currently taking: see medication list - Norvasc  10 mg daily Med Adherence: [x]  Yes    []  No Medication side effects: []  Yes    [x]  No Adherence with salt restriction: [x]  Yes Home Monitoring?: [x]  Yes    []  No Monitoring Frequency: once a wk Home BP results range: 106-120s/mid to low 80 SOB? []  Yes    [x]  No Chest Pain?: []  Yes    [x]  No Leg swelling?: []  Yes    [x]  No Headaches?: []  Yes    [x]  No Dizziness? []  Yes    [x]  No Comments:   HL: taking and tolerating Lipitor 10 mg daily   Vit D was 8.1 in Dec; checked by her psychiatrist..  She has been taking Vit D 1000 international units  OTC since then  HM: declines Tdapt today.  Patient Active Problem List   Diagnosis Date Noted   Tetanus, diphtheria, and acellular pertussis (Tdap) vaccination declined 07/31/2022   High risk medication use 06/19/2022   Hyperprolactinemia (HCC) 03/27/2022   History of lump of left breast 05/18/2021   Family history of breast cancer 05/18/2021   History of hyperprolactinemia 05/18/2021   Breast lump 03/17/2020   Essential hypertension 02/05/2012   Schizoaffective disorder, depressive type (HCC) 01/03/2012   HLD (hyperlipidemia) 01/03/2012     Current Outpatient Medications on File Prior to Visit  Medication Sig Dispense Refill   amLODipine  (NORVASC ) 10 MG tablet Take 1 tablet (10 mg total) by mouth daily. 90 tablet 2   ARIPiprazole  (ABILIFY ) 5 MG tablet Take 0.5 tablets (2.5 mg total) by mouth at bedtime. 15 tablet 3   atorvastatin  (LIPITOR) 10 MG tablet Take 1 tablet (10  mg total) by mouth daily. 90 tablet 2   No current facility-administered medications on file prior to visit.    No Known Allergies  Social History   Socioeconomic History   Marital status: Divorced    Spouse name: Not on file   Number of children: Not on file   Years of education: Not on file   Highest education level: Professional school degree (e.g., MD, DDS, DVM, JD)  Occupational History   Not on file  Tobacco Use   Smoking status: Never   Smokeless tobacco: Never  Vaping Use   Vaping status: Never Used  Substance and Sexual Activity   Alcohol use: No   Drug use: No   Sexual activity: Not Currently  Other Topics Concern   Not on file  Social History Narrative   Not on file   Social Drivers of Health   Financial Resource Strain: Low Risk  (04/16/2023)   Overall Financial Resource Strain (CARDIA)    Difficulty of Paying Living Expenses: Not very hard  Food Insecurity: No Food Insecurity (04/16/2023)   Hunger Vital Sign    Worried About Running Out of Food in the Last Year: Never true    Ran Out of Food in the Last Year: Never true  Transportation Needs: No Transportation Needs (04/16/2023)   PRAPARE - Administrator, Civil Service (Medical): No  Lack of Transportation (Non-Medical): No  Physical Activity: Insufficiently Active (04/16/2023)   Exercise Vital Sign    Days of Exercise per Week: 5 days    Minutes of Exercise per Session: 20 min  Stress: No Stress Concern Present (04/16/2023)   [employer] of Occupational Health - Occupational Stress Questionnaire    Feeling of Stress : Not at all  Social Connections: Socially Isolated (04/16/2023)   Social Connection and Isolation Panel [NHANES]    Frequency of Communication with Friends and Family: Once a week    Frequency of Social Gatherings with Friends and Family: Once a week    Attends Religious Services: 1 to 4 times per year    Active Member of [employer] or Organizations: No    Attends Tax Inspector Meetings: Never    Marital Status: Divorced  Catering Manager Violence: Not At Risk (04/16/2023)   Humiliation, Afraid, Rape, and Kick questionnaire    Fear of Current or Ex-Partner: No    Emotionally Abused: No    Physically Abused: No    Sexually Abused: No    Family History  Problem Relation Age of Onset   Hyperlipidemia Mother    Hypertension Mother    Hyperlipidemia Father    Hypertension Father    Heart disease Father    Breast cancer Sister     Past Surgical History:  Procedure Laterality Date   FINGER SURGERY  1983    ROS: Review of Systems Negative except as stated above  PHYSICAL EXAM: BP 132/84   Pulse 93   Ht 5' 5 (1.651 m)   Wt 135 lb (61.2 kg)   LMP  (LMP Unknown)   SpO2 99%   BMI 22.47 kg/m   Physical Exam  General appearance - alert, well appearing, and in no distress Mental status - pt answers questions appropriately Chest - clear to auscultation, no wheezes, rales or rhonchi, symmetric air entry Heart - normal rate, regular rhythm, normal S1, S2.  Soft SEM left sternal border. Extremities - peripheral pulses normal, no pedal edema, no clubbing or cyanosis      Latest Ref Rng & Units 06/22/2022    8:41 AM 08/30/2021    9:04 AM 04/12/2021    5:22 PM  CMP  Glucose 70 - 99 mg/dL 90   81   BUN 6 - 24 mg/dL 9   7   Creatinine 9.42 - 1.00 mg/dL 9.14   8.97   Sodium 865 - 144 mmol/L 139   142   Potassium 3.5 - 5.2 mmol/L 3.8   4.0   Chloride 96 - 106 mmol/L 102   102   CO2 20 - 29 mmol/L 24     Calcium  8.7 - 10.2 mg/dL 89.9   89.7   Total Protein 6.0 - 8.5 g/dL 7.6  7.5  7.9   Total Bilirubin 0.0 - 1.2 mg/dL 0.9  0.8  0.7   Alkaline Phos 44 - 121 IU/L 76  66  72   AST 0 - 40 IU/L 25  15  16    ALT 0 - 32 IU/L 19  13     Lipid Panel     Component Value Date/Time   CHOL 172 02/27/2023 0958   TRIG 64 02/27/2023 0958   HDL 90 02/27/2023 0958   CHOLHDL 1.9 02/27/2023 0958   CHOLHDL 2.7 10/17/2015 0959   VLDL 15 10/17/2015 0959    LDLCALC 70 02/27/2023 0958    CBC    Component Value Date/Time  WBC 3.8 02/27/2023 0958   WBC 5.5 11/22/2014 0850   RBC 4.05 02/27/2023 0958   RBC 4.10 11/22/2014 0850   HGB 12.6 02/27/2023 0958   HCT 38.5 02/27/2023 0958   PLT 361 02/27/2023 0958   MCV 95 02/27/2023 0958   MCH 31.1 02/27/2023 0958   MCH 32.4 11/22/2014 0850   MCHC 32.7 02/27/2023 0958   MCHC 33.8 11/22/2014 0850   RDW 11.7 02/27/2023 0958   LYMPHSABS 2.6 02/27/2023 0958   MONOABS 0.6 11/22/2014 0850   EOSABS 0.0 02/27/2023 0958   BASOSABS 0.0 02/27/2023 0958    ASSESSMENT AND PLAN: 1. Essential hypertension (Primary) Diastolic blood pressure close to but persistently not at goal.  We discussed adding another blood pressure medicine to amlodipine  but patient wants to hold off for now.  She will continue amlodipine  10 mg daily.  2. Mixed hyperlipidemia Last LDL was at goal in December.  Continue atorvastatin  10 mg daily.  3. Vitamin D  deficiency Continue vitamin D  1000 international units daily.  Recheck vitamin D  level.  Advised patient that she may need high-dose vitamin D  if level is still low. - VITAMIN D  25 Hydroxy (Vit-D Deficiency, Fractures)  4. Tetanus, diphtheria, and acellular pertussis (Tdap) vaccination declined Recommended.  Patient declined.     Patient was given the opportunity to ask questions.  Patient verbalized understanding of the plan and was able to repeat key elements of the plan.   This documentation was completed using Paediatric nurse.  Any transcriptional errors are unintentional.  Orders Placed This Encounter  Procedures   VITAMIN D  25 Hydroxy (Vit-D Deficiency, Fractures)     Requested Prescriptions    No prescriptions requested or ordered in this encounter    Return in about 4 months (around 08/14/2023).  Barnie Louder, MD, FACP

## 2023-04-17 ENCOUNTER — Encounter: Payer: Self-pay | Admitting: Internal Medicine

## 2023-04-17 LAB — VITAMIN D 25 HYDROXY (VIT D DEFICIENCY, FRACTURES): Vit D, 25-Hydroxy: 22.6 ng/mL — ABNORMAL LOW (ref 30.0–100.0)

## 2023-05-15 ENCOUNTER — Other Ambulatory Visit: Payer: Self-pay

## 2023-05-17 ENCOUNTER — Other Ambulatory Visit: Payer: Self-pay

## 2023-05-20 ENCOUNTER — Other Ambulatory Visit: Payer: Self-pay

## 2023-05-20 ENCOUNTER — Telehealth (HOSPITAL_COMMUNITY): Payer: Self-pay

## 2023-05-20 NOTE — Telephone Encounter (Signed)
 Medication management - Prior authorization for patient's prescribed Aripirazole 5 mg tablets submitted online with CoverMyMeds and sent to patient's Perform Rx plan for review and pending decision.

## 2023-05-21 ENCOUNTER — Other Ambulatory Visit: Payer: Self-pay

## 2023-05-21 ENCOUNTER — Telehealth (HOSPITAL_COMMUNITY): Payer: Self-pay | Admitting: *Deleted

## 2023-05-21 NOTE — Telephone Encounter (Signed)
 Fax received for PA approval of Aripiprazole 5mg  until 05/19/24. Called to notify pharmacy.

## 2023-05-22 ENCOUNTER — Other Ambulatory Visit: Payer: Self-pay

## 2023-05-27 NOTE — Progress Notes (Unsigned)
 [facility] MD Outpatient Progress Note  05/28/2023 2:09 PM Sandra Bell  MRN:  [id number]  Assessment:  Sandra Bell presents for follow-up evaluation in-person. Today, 05/28/23, patient reports ongoing psychiatric stability. While she continues to endorse daily self derogatory auditory hallucinations, she reports AH remain at baseline and she is fairly easily able to ignore these voices without disruption to routine or significant distress. She prefers to remain at current low-dose Abilify although was reminded further titration can be pursued if desired in the future. Patient has connected with case manager to explore options for supported living although shares that she may not be eligible for group home as she was denied for SSDI. She is continuing to explore other options including option of continuing to live with family.  RTC in 3 months in person.  Identifying Information: Sandra Bell is a 53 y.o. female with a history of schizoaffective disorder, HTN, and HLD who is an established patient with [facility] for management of schizoaffective disorder.   Plan:  # Schizoaffective disorder, depressive type Past medication trials: Invega PO; [name] Status of problem: stable Interventions: -- Continue Abilify 2.5 mg nightly (patient thus far has declined titration to higher dosing; prefers nighttime dosing) -- Patient has inquired into group home and supportive living options as she currently lives with her mother who is 71 years old; resources for [employer], case management through Medicaid, and [employer] provided in AVS  # Vitamin D deficiency Interventions: -- Vitamin D 8.1 02/27/23 -> improved to 22.6 04/16/23; PCP has increased Vitamin D3 to 2000 IU daily since Feb 2025  # Medication monitoring Interventions: -- Lipid profile 02/27/23 wnl -- Hgb A1c 02/27/23 wnl -- While on Invega, experienced elevated PRL levels: 142 (04/12/21) --> 108  (09/25/21) --> 50.6 (01/25/22) -> 35.2 (06/22/22) -> 23.2 (02/27/23) -- Due to normalization of PRL and resolution of symptoms, will not continue to check unless signs/sx of hyperPRL re-emerge   Patient was given contact information for behavioral health clinic and was instructed to call 911 for emergencies.   Subjective:  Chief Complaint:  Chief Complaint  Patient presents with   Medication Management    Interval History:   Patient reports things have been "pretty good." She has continued to coordinate with case manager about finding a group home however was told she needs SSDI which she was denied for in August 2024. Looking into alternative options. Thinks if this doesn't work out could stay with family (sister in [place] or niece in [place]).   Reports adherence to Abilify and remains "pleased" with this medication. Denies sedation or side effects. Reports the voices are the same in which they are daily but she feels she had accepted and adjusted to this. Takes some effort to ignore but typically able to do so; helps to maintain a consistent routine each day.   Denies feeling low or depressed; denies significant anxiety. Reports intact self care and energy/motivation. Engages in yoga/pilates each morning. Sleeping well with about 7-8 hours nightly. Appetite has been stable; following DASH diet accommodated for vegetarians.   Reports adherence to Vitamin D 2000 IU daily.   No questions/concerns at this time; amenable to continuing medications as currently rx.   Visit Diagnosis:    ICD-10-CM   1. Schizoaffective disorder, depressive type (HCC)  F25.1       Past Psychiatric History:  Diagnoses: schizoaffective disorder, depressive type Medication trials:  Invega PO (dizziness, abd pain, hyperPRL); Invega Trinza (hyperPRL with galactorrhea) Hospitalizations: last psychiatric  hospitalization in 2011 Suicide attempts: denies SIB: denies Current access to guns: denies Substance use:  denies use of etoh, tobacco, or illicit drugs Living: lives with her mother; patient manages medications independently  Past Medical History:  Past Medical History:  Diagnosis Date   Hypertension    Schizoaffective disorder (HCC) 2003    Past Surgical History:  Procedure Laterality Date   FINGER SURGERY  1983   Family Psychiatric History: denies  Family History:  Family History  Problem Relation Age of Onset   Hyperlipidemia Mother    Hypertension Mother    Hyperlipidemia Father    Hypertension Father    Heart disease Father    Breast cancer Sister     Social History:  Social History   Socioeconomic History   Marital status: Divorced    Spouse name: Not on file   Number of children: Not on file   Years of education: Not on file   Highest education level: Professional school degree (e.g., MD, DDS, DVM, JD)  Occupational History   Not on file  Tobacco Use   Smoking status: Never   Smokeless tobacco: Never  Vaping Use   Vaping status: Never Used  Substance and Sexual Activity   Alcohol use: No   Drug use: No   Sexual activity: Not Currently  Other Topics Concern   Not on file  Social History Narrative   Not on file   Social Drivers of Health   Financial Resource Strain: Low Risk  (04/16/2023)   Overall Financial Resource Strain (CARDIA)    Difficulty of Paying Living Expenses: Not very hard  Food Insecurity: No Food Insecurity (04/16/2023)   Hunger Vital Sign    Worried About Running Out of Food in the Last Year: Never true    Ran Out of Food in the Last Year: Never true  Transportation Needs: No Transportation Needs (04/16/2023)   PRAPARE - Administrator, Civil Service (Medical): No    Lack of Transportation (Non-Medical): No  Physical Activity: Insufficiently Active (04/16/2023)   Exercise Vital Sign    Days of Exercise per Week: 5 days    Minutes of Exercise per Session: 20 min  Stress: No Stress Concern Present (04/16/2023)   [employer]  of Occupational Health - Occupational Stress Questionnaire    Feeling of Stress : Not at all  Social Connections: Socially Isolated (04/16/2023)   Social Connection and Isolation Panel [NHANES]    Frequency of Communication with Friends and Family: Once a week    Frequency of Social Gatherings with Friends and Family: Once a week    Attends Religious Services: 1 to 4 times per year    Active Member of [employer] or Organizations: No    Attends Banker Meetings: Never    Marital Status: Divorced    Allergies: No Known Allergies  Current Medications: Current Outpatient Medications  Medication Sig Dispense Refill   ARIPiprazole (ABILIFY) 5 MG tablet Take 0.5 tablets (2.5 mg total) by mouth at bedtime. 15 tablet 3   amLODipine (NORVASC) 10 MG tablet Take 1 tablet (10 mg total) by mouth daily. 90 tablet 2   atorvastatin (LIPITOR) 10 MG tablet Take 1 tablet (10 mg total) by mouth daily. 90 tablet 2   No current facility-administered medications for this visit.    ROS: Denies physical complaints; reports BM daily and denies constipation  Objective:  Psychiatric Specialty Exam: There were no vitals taken for this visit.There is no height or weight on  file to calculate BMI.  General Appearance: Casual and wearing multiple layers, slippers, as thick glasses wrapped in yarn without lenses  Eye Contact:  Good  Speech:  Clear and Coherent and Normal Rate  Volume:  Normal  Mood:   "pretty good"  Affect:  Constricted and Euthymic; pleasant  Thought Content:  Endorses daily self-derogatory AH that are unchanged from prior; denies CAH. Denies VH.    Suicidal Thoughts:  No  Homicidal Thoughts:  No  Thought Process:  Goal Directed and Linear  Orientation:  Full (Time, Place, and Person)    Memory:   Grossly intact  Judgment:  Good  Insight:  Fair  Concentration:  Concentration: Good  Recall:  NA  Fund of Knowledge: Good  Language: Good  Psychomotor Activity:  Normal  Akathisia:   No  AIMS (if indicated): not done  Assets:  Communication Skills Desire for Improvement Housing Leisure Time Physical Health Social Support Transportation  ADL's:  Intact  Cognition: WNL  Sleep:  Good   PE: General: well-appearing; no acute distress  Pulm: no increased work of breathing on room air  Strength & Muscle Tone: within normal limits Neuro: no focal neurological deficits observed  Gait & Station: normal  Metabolic Disorder Labs: Lab Results  Component Value Date   HGBA1C 4.9 02/27/2023   Lab Results  Component Value Date   PROLACTIN 23.2 02/27/2023   PROLACTIN 35.2 (H) 06/22/2022   Lab Results  Component Value Date   CHOL 172 02/27/2023   TRIG 64 02/27/2023   HDL 90 02/27/2023   CHOLHDL 1.9 02/27/2023   VLDL 15 10/17/2015   LDLCALC 70 02/27/2023   LDLCALC 81 08/30/2021   Lab Results  Component Value Date   TSH 2.310 06/22/2022   TSH 1.750 04/01/2018    Therapeutic Level Labs: No results found for: "LITHIUM" No results found for: "VALPROATE" No results found for: "CBMZ"  Screenings: AIMS    Flowsheet Row Office Visit from 05/30/2021 in [facility]  AIMS Total Score 0      GAD-7    Flowsheet Row Office Visit from 04/16/2023 in [facility] A Dept Of [place]. [facility] Counselor from 01/17/2023 in [facility] Office Visit from 12/11/2022 in [facility] [place] - A Dept Of [place]. [facility] Office Visit from 07/31/2022 in [facility] - A Dept Of Eligha Bridegroom. [facility] Office Visit from 03/27/2022 in [facility] [place] - A Dept Of Eligha Bridegroom. [facility]  Total GAD-7 Score 1 0 0 1 1      PHQ2-9    Flowsheet Row Office Visit from 04/16/2023 in [facility] [place] - A Dept Of [place]. [facility] Counselor from 01/17/2023 in [facility]  Office Visit from 12/11/2022 in [facility] [place] - A Dept Of . [facility] Office Visit from 07/31/2022 in [facility] - A Dept Of Eligha Bridegroom. [facility] Office Visit from 03/27/2022 in [facility] [place] - A Dept Of Eligha Bridegroom. [facility]  PHQ-2 Total Score 0 0 0 0 0  PHQ-9 Total Score 0 -- 0 0 0      Flowsheet Row Counselor from 01/17/2023 in [facility]  C-SSRS RISK CATEGORY No Risk       Collaboration of Care:  Collaboration of Care: Medication Management AEB ongoing medication management and Psychiatrist AEB established with this provider  Patient/Guardian was advised Release of Information must be obtained prior to any record release in order to collaborate their care with an outside provider. Patient/Guardian was advised if they have not already done so to contact the registration department to sign all necessary forms in order for [place] to release information regarding their care.   Consent: Patient/Guardian gives verbal consent for treatment and assignment of benefits for services provided during this visit. Patient/Guardian expressed understanding and agreed to proceed.   A total of 25 minutes was spent involved in face to face clinical care, chart review, documentation, and medication management and counseling.   Sandra Bell A Tiffnay Bossi 05/28/2023, 2:09 PM

## 2023-05-28 ENCOUNTER — Other Ambulatory Visit: Payer: Self-pay

## 2023-05-28 ENCOUNTER — Ambulatory Visit (INDEPENDENT_AMBULATORY_CARE_PROVIDER_SITE_OTHER): Payer: MEDICAID | Admitting: Psychiatry

## 2023-05-28 ENCOUNTER — Encounter (HOSPITAL_COMMUNITY): Payer: Self-pay | Admitting: Psychiatry

## 2023-05-28 VITALS — BP 139/86 | HR 90 | Ht 64.5 in | Wt 137.0 lb

## 2023-05-28 DIAGNOSIS — F251 Schizoaffective disorder, depressive type: Secondary | ICD-10-CM | POA: Diagnosis not present

## 2023-05-28 MED ORDER — ARIPIPRAZOLE 5 MG PO TABS
2.5000 mg | ORAL_TABLET | Freq: Every evening | ORAL | 3 refills | Status: DC
  Filled 2023-05-28 – 2023-06-13 (×2): qty 15, 30d supply, fill #0
  Filled 2023-08-15: qty 15, 30d supply, fill #1

## 2023-05-28 NOTE — Patient Instructions (Signed)

## 2023-06-13 ENCOUNTER — Other Ambulatory Visit: Payer: Self-pay

## 2023-06-18 ENCOUNTER — Other Ambulatory Visit: Payer: Self-pay

## 2023-07-16 ENCOUNTER — Other Ambulatory Visit: Payer: Self-pay

## 2023-08-13 ENCOUNTER — Ambulatory Visit: Payer: MEDICAID | Admitting: Internal Medicine

## 2023-08-15 ENCOUNTER — Other Ambulatory Visit: Payer: Self-pay

## 2023-08-20 ENCOUNTER — Other Ambulatory Visit: Payer: Self-pay

## 2023-08-20 ENCOUNTER — Other Ambulatory Visit: Payer: Self-pay | Admitting: Internal Medicine

## 2023-08-20 DIAGNOSIS — Z1231 Encounter for screening mammogram for malignant neoplasm of breast: Secondary | ICD-10-CM

## 2023-08-23 NOTE — Progress Notes (Addendum)
 BH MD Outpatient Progress Note  08/27/2023 2:17 PM Sandra Bell  MRN:  [id number]  Assessment:  Sandra Bell presents for follow-up evaluation by video. Today, 08/27/23, patient reports continued psychiatric stability with AH remaining at overall baseline. Denies associated dysfunction or distress and finds structure and routine have been helpful for remaining grounded. Denies signs/sx of mood disturbance, anxiety, or sleep disruption. No changes to plan of care at this time.   RTC in 3 months by video.  Identifying Information: Sandra Bell is a 53 y.o. female with a history of schizoaffective disorder, HTN, and HLD who is an established patient with [facility] for management of schizoaffective disorder. At her baseline, she experiences daily self derogatory auditory hallucinations although she is fairly easily able to ignore these voices without disruption to routine or leading to significant distress. She has preferred to remain at current low-dose Abilify  although has been reminded further titration can be pursued in the future if desired.  Plan:  # Schizoaffective disorder, depressive type Past medication trials: Invega  PO; Invega  Trinza Status of problem: stable Interventions: -- Continue Abilify  2.5 mg nightly (patient thus far has declined titration to higher dosing; prefers nighttime dosing) -- Patient previously inquired into group home and supportive living options as she currently lives with her mother who is > 4 years old; resources for [employer], case management through [employer], and Sandra Bell previously provided. Upon further exploration, patient has identified likely ability to live with sisters or niece should she need to transition from mother's home.   # Vitamin D  deficiency Interventions: -- Vitamin D  8.1 02/27/23 -> improved to 22.6 04/16/23; PCP has increased Vitamin D3 to 2000 IU daily since Feb 2025  # Medication  monitoring Interventions: -- Lipid profile 02/27/23 wnl -- Hgb A1c 02/27/23 wnl -- While on Invega , experienced elevated PRL levels: 142 (04/12/21) --> 108 (09/25/21) --> 50.6 (01/25/22) -> 35.2 (06/22/22) -> 23.2 (02/27/23) -- Due to normalization of PRL and resolution of symptoms, will not continue to check unless signs/sx of hyperPRL re-emerge   Patient was given contact information for behavioral health clinic and was instructed to call 911 for emergencies.   Subjective:  Chief Complaint:  Chief Complaint  Patient presents with   Medication Management    Interval History:   Patient reports she is pretty good and denies any major changes or updates. Adherent to low dose Abilify .   Denies any change in voices; continues to find medication helpful for keeping voices at manageable level. Denies any distress/dysfunction from voices. Sleeping 8 hours nightly. Continues to engage in yoga and pilates in the morning 5 days per week.  Continues to follow DASH diet and taking vitamin D . Notes improved blood pressure control since making dietary changes. Denies dizziness/lightheadedness.   Continues to live with mother; if she were to transition from the home she would likely stay with one of her sisters or niece.   Obtained cell phone through Medicaid benefits and this has allowed her to stay more connected with her care. Plans to use their transportation for doctor's appointments so she doesn't have to rely on her mom. Appreciates 90 day rx if possible.   Amenable to continuing medications as prescribed.  Visit Diagnosis:    ICD-10-CM   1. Schizoaffective disorder, depressive type (HCC)  F25.1 ARIPiprazole  (ABILIFY ) 5 MG tablet      Past Psychiatric History:  Diagnoses: schizoaffective disorder, depressive type Medication trials:  Invega  PO (dizziness, abd pain, hyperPRL); Invega  Trinza (hyperPRL with  galactorrhea) Hospitalizations: last psychiatric hospitalization in 2011 Suicide  attempts: denies SIB: denies Current access to guns: denies Substance use: denies use of etoh, tobacco, or illicit drugs Living: lives with her mother; patient manages medications independently  Past Medical History:  Past Medical History:  Diagnosis Date   Hypertension    Schizoaffective disorder (HCC) 2003    Past Surgical History:  Procedure Laterality Date   FINGER SURGERY  1983   Family Psychiatric History: denies  Family History:  Family History  Problem Relation Age of Onset   Hyperlipidemia Mother    Hypertension Mother    Hyperlipidemia Father    Hypertension Father    Heart disease Father    Breast cancer Sister     Social History:  Social History   Socioeconomic History   Marital status: Divorced    Spouse name: Not on file   Number of children: Not on file   Years of education: Not on file   Highest education level: Professional school degree (e.g., MD, DDS, DVM, JD)  Occupational History   Not on file  Tobacco Use   Smoking status: Never   Smokeless tobacco: Never  Vaping Use   Vaping status: Never Used  Substance and Sexual Activity   Alcohol use: No   Drug use: No   Sexual activity: Not Currently  Other Topics Concern   Not on file  Social History Narrative   Not on file   Social Drivers of Health   Financial Resource Strain: Low Risk  (04/16/2023)   Overall Financial Resource Strain (CARDIA)    Difficulty of Paying Living Expenses: Not very hard  Food Insecurity: No Food Insecurity (04/16/2023)   Hunger Vital Sign    Worried About Running Out of Food in the Last Year: Never true    Ran Out of Food in the Last Year: Never true  Transportation Needs: No Transportation Needs (04/16/2023)   PRAPARE - Administrator, Civil Service (Medical): No    Lack of Transportation (Non-Medical): No  Physical Activity: Insufficiently Active (04/16/2023)   Exercise Vital Sign    Days of Exercise per Week: 5 days    Minutes of Exercise per  Session: 20 min  Stress: No Stress Concern Present (04/16/2023)   [employer] of Occupational Health - Occupational Stress Questionnaire    Feeling of Stress : Not at all  Social Connections: Socially Isolated (04/16/2023)   Social Connection and Isolation Panel    Frequency of Communication with Friends and Family: Once a week    Frequency of Social Gatherings with Friends and Family: Once a week    Attends Religious Services: 1 to 4 times per year    Active Member of [employer] or Organizations: No    Attends Banker Meetings: Never    Marital Status: Divorced    Allergies: No Known Allergies  Current Medications: Current Outpatient Medications  Medication Sig Dispense Refill   amLODipine  (NORVASC ) 10 MG tablet Take 1 tablet (10 mg total) by mouth daily. 90 tablet 2   cholecalciferol (VITAMIN D3) 25 MCG (1000 UNIT) tablet Take 2,000 Units by mouth daily.     ARIPiprazole  (ABILIFY ) 5 MG tablet Take 0.5 tablets (2.5 mg total) by mouth at bedtime. 45 tablet 1   atorvastatin  (LIPITOR) 10 MG tablet Take 1 tablet (10 mg total) by mouth daily. 90 tablet 2   No current facility-administered medications for this visit.    ROS: Denies physical complaints; reports BM daily and denies  constipation  Objective:  Psychiatric Specialty Exam: There were no vitals taken for this visit.There is no height or weight on file to calculate BMI.  General Appearance: Casual and Well Groomed  Eye Contact:  Good  Speech:  Clear and Coherent and Normal Rate  Volume:  Normal  Mood:  pretty good  Affect:  Constricted and Euthymic; pleasant  Thought Content: Endorses daily self-derogatory AH that are unchanged from prior; denies CAH. Denies VH.   Suicidal Thoughts:  No  Homicidal Thoughts:  No  Thought Process:  Goal Directed and Linear  Orientation:  Full (Time, Place, and Person)    Memory:  Grossly intact  Judgment:  Good  Insight:  Fair  Concentration:  Concentration: Good   Recall:  NA  Fund of Knowledge: Good  Language: Good  Psychomotor Activity:  Normal  Akathisia:  No  AIMS (if indicated): not done  Assets:  Communication Skills Desire for Improvement Housing Leisure Time Physical Health Social Support Transportation  ADL's:  Intact  Cognition: WNL  Sleep:  Good   PE: General: sits comfortably in view of camera; no acute distress  Pulm: no increased work of breathing on room air  MSK: all extremity movements appear intact  Neuro: no focal neurological deficits observed  Gait & Station: unable to assess by video   Metabolic Disorder Labs: Lab Results  Component Value Date   HGBA1C 4.9 02/27/2023   Lab Results  Component Value Date   PROLACTIN 23.2 02/27/2023   PROLACTIN 35.2 (H) 06/22/2022   Lab Results  Component Value Date   CHOL 172 02/27/2023   TRIG 64 02/27/2023   HDL 90 02/27/2023   CHOLHDL 1.9 02/27/2023   VLDL 15 10/17/2015   LDLCALC 70 02/27/2023   LDLCALC 81 08/30/2021   Lab Results  Component Value Date   TSH 2.310 06/22/2022   TSH 1.750 04/01/2018    Therapeutic Level Labs: No results found for: LITHIUM No results found for: VALPROATE No results found for: CBMZ  Screenings: AIMS    Flowsheet Row Office Visit from [facility]/30/2021 in [facility]  AIMS Total Score 0   GAD-7    Flowsheet Row Office Visit from 04/16/2023 in [facility] [place] - A Dept Of [place]. [facility] Counselor from 01/17/2023 in [facility] Office Visit from 12/11/2022 in [facility] [place] - A Dept Of [place]. [facility] Office Visit from 07/31/2022 in [facility] - A Dept Of [place] [facility]. [facility] Office Visit from 03/27/2022 in [facility] [place] - A Dept Of [place]. [facility]  Total GAD-7 Score 1 0 0 1 1   PHQ2-9    Flowsheet Row Office Visit from 04/16/2023 in [facility]  Health [place] - A Dept Of [place]. [facility] Counselor from 01/17/2023 in [facility] Office Visit from 12/11/2022 in [facility] [place] - A Dept Of [place]. [facility] Office Visit from 07/31/2022 in [facility] - A Dept Of [place] [facility]. [facility] Office Visit from 03/27/2022 in [facility] [place] - A Dept Of [place]. [facility]  PHQ-2 Total Score 0 0 0 0 0  PHQ-9 Total Score 0 -- 0 0 0   Flowsheet Row Counselor from 01/17/2023 in [facility]  C-SSRS RISK CATEGORY No Risk  Collaboration of Care: Collaboration of Care: Medication Management AEB ongoing medication management and Psychiatrist AEB established with this provider  Patient/Guardian was advised Release of Information must be obtained prior to any record release in order to collaborate their care with an outside provider. Patient/Guardian was advised if they have not already done so to contact the registration department to sign all necessary forms in order for us  to release information regarding their care.   Consent: Patient/Guardian gives verbal consent for treatment and assignment of benefits for services provided during this visit. Patient/Guardian expressed understanding and agreed to proceed.   Virtual Visit via Video Note  I connected with Sandra Bell on 08/27/23 at  2:00 PM EDT by a video enabled telemedicine application and verified that I am speaking with the correct person using two identifiers.  Location: Patient: home address in [place] Provider: clinic   I discussed the limitations of evaluation and management by telemedicine and the availability of in person appointments. The patient expressed understanding and agreed to proceed.  I discussed the assessment and treatment plan with the patient. The patient was provided an opportunity to ask questions and all were answered.  The patient agreed with the plan and demonstrated an understanding of the instructions.   The patient was advised to call back or seek an in-person evaluation if the symptoms worsen or if the condition fails to improve as anticipated.  I provided 25 minutes dedicated to the care of this patient via video on the date of this encounter to include chart review, face-to-face time with the patient, medication management/counseling.  Brett Darko A Syriana Croslin 08/27/2023, 2:17 PM

## 2023-08-27 ENCOUNTER — Encounter (HOSPITAL_COMMUNITY): Payer: Self-pay | Admitting: Psychiatry

## 2023-08-27 ENCOUNTER — Telehealth (INDEPENDENT_AMBULATORY_CARE_PROVIDER_SITE_OTHER): Payer: MEDICAID | Admitting: Psychiatry

## 2023-08-27 ENCOUNTER — Other Ambulatory Visit: Payer: Self-pay

## 2023-08-27 DIAGNOSIS — F251 Schizoaffective disorder, depressive type: Secondary | ICD-10-CM | POA: Diagnosis not present

## 2023-08-27 DIAGNOSIS — E559 Vitamin D deficiency, unspecified: Secondary | ICD-10-CM

## 2023-08-27 MED ORDER — ARIPIPRAZOLE 5 MG PO TABS
2.5000 mg | ORAL_TABLET | Freq: Every evening | ORAL | 1 refills | Status: DC
  Filled 2023-08-27 – 2023-09-10 (×2): qty 45, 90d supply, fill #0

## 2023-08-27 NOTE — Patient Instructions (Signed)

## 2023-09-10 ENCOUNTER — Other Ambulatory Visit: Payer: Self-pay | Admitting: Internal Medicine

## 2023-09-10 ENCOUNTER — Other Ambulatory Visit: Payer: Self-pay

## 2023-09-10 DIAGNOSIS — E782 Mixed hyperlipidemia: Secondary | ICD-10-CM

## 2023-09-10 DIAGNOSIS — I10 Essential (primary) hypertension: Secondary | ICD-10-CM

## 2023-09-10 MED ORDER — AMLODIPINE BESYLATE 10 MG PO TABS
10.0000 mg | ORAL_TABLET | Freq: Every day | ORAL | 2 refills | Status: AC
  Filled 2023-09-10: qty 90, 90d supply, fill #0
  Filled 2023-12-11: qty 90, 90d supply, fill #1
  Filled 2024-03-11: qty 90, 90d supply, fill #2

## 2023-09-10 MED ORDER — ATORVASTATIN CALCIUM 10 MG PO TABS
10.0000 mg | ORAL_TABLET | Freq: Every day | ORAL | 2 refills | Status: AC
  Filled 2023-09-10: qty 90, 90d supply, fill #0
  Filled 2023-12-11: qty 90, 90d supply, fill #1
  Filled 2024-03-11: qty 90, 90d supply, fill #2

## 2023-09-11 ENCOUNTER — Other Ambulatory Visit: Payer: Self-pay

## 2023-09-16 ENCOUNTER — Other Ambulatory Visit: Payer: Self-pay

## 2023-09-24 ENCOUNTER — Ambulatory Visit: Payer: MEDICAID | Admitting: Internal Medicine

## 2023-09-26 ENCOUNTER — Ambulatory Visit: Payer: MEDICAID | Attending: Internal Medicine | Admitting: Internal Medicine

## 2023-09-26 ENCOUNTER — Encounter: Payer: Self-pay | Admitting: Internal Medicine

## 2023-09-26 VITALS — BP 114/74 | HR 86 | Temp 98.2°F | Ht 64.0 in | Wt 134.0 lb

## 2023-09-26 DIAGNOSIS — E782 Mixed hyperlipidemia: Secondary | ICD-10-CM | POA: Diagnosis not present

## 2023-09-26 DIAGNOSIS — Z23 Encounter for immunization: Secondary | ICD-10-CM

## 2023-09-26 DIAGNOSIS — E559 Vitamin D deficiency, unspecified: Secondary | ICD-10-CM

## 2023-09-26 DIAGNOSIS — Z1211 Encounter for screening for malignant neoplasm of colon: Secondary | ICD-10-CM

## 2023-09-26 DIAGNOSIS — I10 Essential (primary) hypertension: Secondary | ICD-10-CM | POA: Diagnosis not present

## 2023-09-26 NOTE — Progress Notes (Signed)
 Patient ID: Sandra Bell, female    DOB: 1970-07-13  MRN: [id number]  CC: Hypertension (HTN f/u. /No questions / concerns/)   Subjective: Sandra Bell is a 53 y.o. female who presents for chronic ds management. Her concerns today include:  Pt with hx of HTN, HL, schizoaffective disorder, Fhx of breast CA in sister (reportedly tested neg for BRCA), elev Prolactin level due to Invega    Discussed the use of AI scribe software for clinical note transcription with the patient, who gave verbal consent to proceed.  History of Present Illness Sandra Bell is a 53 year old female who presents for a follow-up visit.  She is currently managing hypertension with amlodipine  10 mg taken in the evening. Her blood pressure has improved with recent readings between 101/73 and 115/76, attributed to her adoption of a vegan diet. She monitors her blood pressure weekly using an arm cuff. No chest pain, shortness of breath, leg swelling, or chronic headaches.  She continues atorvastatin  10 mg daily for hyperlipidemia, with her last cholesterol check in December showing good levels of total cholesterol and LDL.  Her vitamin D  level, last checked in February, showed improvement to 22.6 ng/mL. She has increased her vitamin D  intake to 2000 IU daily.    Patient Active Problem List   Diagnosis Date Noted   Tetanus, diphtheria, and acellular pertussis (Tdap) vaccination declined 07/31/2022   High risk medication use 06/19/2022   Hyperprolactinemia (HCC) 03/27/2022   History of lump of left breast 05/18/2021   Family history of breast cancer 05/18/2021   History of hyperprolactinemia 05/18/2021   Breast lump 03/17/2020   Essential hypertension 02/05/2012   Schizoaffective disorder, depressive type (HCC) 01/03/2012   HLD (hyperlipidemia) 01/03/2012     Current Outpatient Medications on File Prior to Visit  Medication Sig Dispense Refill   amLODipine  (NORVASC ) 10 MG tablet Take 1 tablet (10 mg  total) by mouth daily. 90 tablet 2   ARIPiprazole  (ABILIFY ) 5 MG tablet Take 0.5 tablets (2.5 mg total) by mouth at bedtime. 45 tablet 1   atorvastatin  (LIPITOR) 10 MG tablet Take 1 tablet (10 mg total) by mouth daily. 90 tablet 2   cholecalciferol (VITAMIN D3) 25 MCG (1000 UNIT) tablet Take 2,000 Units by mouth daily.     No current facility-administered medications on file prior to visit.    No Known Allergies  Social History   Socioeconomic History   Marital status: Divorced    Spouse name: Not on file   Number of children: Not on file   Years of education: Not on file   Highest education level: Some college, no degree  Occupational History   Not on file  Tobacco Use   Smoking status: Never   Smokeless tobacco: Never  Vaping Use   Vaping status: Never Used  Substance and Sexual Activity   Alcohol use: No   Drug use: No   Sexual activity: Not Currently  Other Topics Concern   Not on file  Social History Narrative   Not on file   Social Drivers of Health   Financial Resource Strain: Low Risk  (09/23/2023)   Overall Financial Resource Strain (CARDIA)    Difficulty of Paying Living Expenses: Not very hard  Food Insecurity: No Food Insecurity (09/23/2023)   Hunger Vital Sign    Worried About Running Out of Food in the Last Year: Never true    Ran Out of Food in the Last Year: Never true  Transportation Needs: No Transportation Needs (  09/23/2023)   PRAPARE - Administrator, Civil Service (Medical): No    Lack of Transportation (Non-Medical): No  Physical Activity: Insufficiently Active (09/23/2023)   Exercise Vital Sign    Days of Exercise per Week: 7 days    Minutes of Exercise per Session: 10 min  Stress: No Stress Concern Present (09/23/2023)   [employer] of Occupational Health - Occupational Stress Questionnaire    Feeling of Stress: Not at all  Social Connections: Moderately Isolated (09/23/2023)   Social Connection and Isolation Panel     Frequency of Communication with Friends and Family: Once a week    Frequency of Social Gatherings with Friends and Family: Once a week    Attends Religious Services: 1 to 4 times per year    Active Member of [employer] or Organizations: Yes    Attends Banker Meetings: 1 to 4 times per year    Marital Status: Divorced  Catering manager Violence: Not At Risk (04/16/2023)   Humiliation, Afraid, Rape, and Kick questionnaire    Fear of Current or Ex-Partner: No    Emotionally Abused: No    Physically Abused: No    Sexually Abused: No    Family History  Problem Relation Age of Onset   Hyperlipidemia Mother    Hypertension Mother    Hyperlipidemia Father    Hypertension Father    Heart disease Father    Breast cancer Sister     Past Surgical History:  Procedure Laterality Date   FINGER SURGERY  1983    ROS: Review of Systems Negative except as stated above  PHYSICAL EXAM: BP 133/82 (BP Location: Left Arm, Patient Position: Sitting, Cuff Size: Small)   Pulse 86   Temp 98.2 F (36.8 C) (Oral)   Ht 5' 4 (1.626 m)   Wt 134 lb (60.8 kg)   LMP  (LMP Unknown)   SpO2 100%   BMI 23.00 kg/m   Physical Exam  General appearance - alert, well appearing, and in no distress Mental status - normal mood, behavior, speech, dress, motor activity, and thought processes Neck - supple, no significant adenopathy Chest - clear to auscultation, no wheezes, rales or rhonchi, symmetric air entry Heart - normal rate, regular rhythm, normal S1, S2. 2/6 SEM along LSB. Extremities - peripheral pulses normal, no pedal edema, no clubbing or cyanosis      Latest Ref Rng & Units 06/22/2022    8:41 AM 08/30/2021    9:04 AM 04/12/2021    5:22 PM  CMP  Glucose 70 - 99 mg/dL 90   81   BUN 6 - 24 mg/dL 9   7   Creatinine 9.42 - 1.00 mg/dL 9.14   8.97   Sodium 865 - 144 mmol/L 139   142   Potassium 3.5 - 5.2 mmol/L 3.8   4.0   Chloride 96 - 106 mmol/L 102   102   CO2 20 - 29 mmol/L 24      Calcium  8.7 - 10.2 mg/dL 89.9   89.7   Total Protein 6.0 - 8.5 g/dL 7.6  7.5  7.9   Total Bilirubin 0.0 - 1.2 mg/dL 0.9  0.8  0.7   Alkaline Phos 44 - 121 IU/L 76  66  72   AST 0 - 40 IU/L 25  15  16    ALT 0 - 32 IU/L 19  13     Lipid Panel     Component Value Date/Time   CHOL 172  02/27/2023 0958   TRIG 64 02/27/2023 0958   HDL 90 02/27/2023 0958   CHOLHDL 1.9 02/27/2023 0958   CHOLHDL 2.7 10/17/2015 0959   VLDL 15 10/17/2015 0959   LDLCALC 70 02/27/2023 0958    CBC    Component Value Date/Time   WBC 3.8 02/27/2023 0958   WBC 5.5 11/22/2014 0850   RBC 4.05 02/27/2023 0958   RBC 4.10 11/22/2014 0850   HGB 12.6 02/27/2023 0958   HCT 38.5 02/27/2023 0958   PLT 361 02/27/2023 0958   MCV 95 02/27/2023 0958   MCH 31.1 02/27/2023 0958   MCH 32.4 11/22/2014 0850   MCHC 32.7 02/27/2023 0958   MCHC 33.8 11/22/2014 0850   RDW 11.7 02/27/2023 0958   LYMPHSABS 2.6 02/27/2023 0958   MONOABS 0.6 11/22/2014 0850   EOSABS 0.0 02/27/2023 0958   BASOSABS 0.0 02/27/2023 0958    ASSESSMENT AND PLAN: 1. Essential hypertension (Primary) Blood pressure controlled with amlodipine  and dietary changes. No symptoms reported. - Continue amlodipine  10 mg daily. - Continue healthy eating habits and salt restriction.  2. Mixed hyperlipidemia Controlled. Continue Lipitor 10 mg  3. Vitamin D  deficiency Continue Vit D 2000 international units  daily  4. Screening for colon cancer - Fecal occult blood, imunochemical([facility])  5. Need for Tdap vaccination Given today.    Patient was given the opportunity to ask questions.  Patient verbalized understanding of the plan and was able to repeat key elements of the plan.   This documentation was completed using Paediatric nurse.  Any transcriptional errors are unintentional.  No orders of the defined types were placed in this encounter.    Requested Prescriptions    No prescriptions requested or ordered in  this encounter    No follow-ups on file.  Barnie Louder, MD, FACP

## 2023-09-26 NOTE — Patient Instructions (Signed)
 VISIT SUMMARY:  During your follow-up visit, we reviewed your current health status and management of hypertension, hyperlipidemia, and vitamin D  deficiency. Your blood pressure and cholesterol levels are well-controlled, and your vitamin D  levels have improved. We also discussed general health maintenance, including necessary screenings and vaccinations.  YOUR PLAN:  -HYPERTENSION: Hypertension, or high blood pressure, is being well-managed with your current medication, amlodipine , and dietary changes. Continue taking amlodipine  10 mg daily, maintain your vegan diet, and limit salt intake. Recheck your blood pressure in four months.  -HYPERLIPIDEMIA: Hyperlipidemia, or high cholesterol, is under control with atorvastatin . Continue taking atorvastatin  10 mg daily.  -VITAMIN D  DEFICIENCY: Your vitamin D  levels have improved with supplementation. Continue taking vitamin D  2000 IU daily.  -GENERAL HEALTH MAINTENANCE: You are due for a colon cancer screening and a tetanus booster. We will provide you with a FIT test for colon cancer screening and administer the tetanus booster today. We also discussed the hepatitis B vaccination.  INSTRUCTIONS:  Recheck your blood pressure in four months. Complete the FIT test for colon cancer screening and return it as instructed. You received a tetanus booster today. Consider discussing the hepatitis B vaccination with your healthcare provider at your next visit.

## 2023-10-01 LAB — FECAL OCCULT BLOOD, IMMUNOCHEMICAL: Fecal Occult Bld: NEGATIVE

## 2023-10-02 ENCOUNTER — Ambulatory Visit: Payer: Self-pay | Admitting: Internal Medicine

## 2023-10-03 ENCOUNTER — Ambulatory Visit
Admission: RE | Admit: 2023-10-03 | Discharge: 2023-10-03 | Disposition: A | Discharge status: Home / Self Care | Payer: MEDICAID | Source: Ambulatory Visit | Attending: Internal Medicine | Admitting: Internal Medicine

## 2023-10-03 DIAGNOSIS — Z1231 Encounter for screening mammogram for malignant neoplasm of breast: Secondary | ICD-10-CM

## 2023-10-07 ENCOUNTER — Ambulatory Visit: Payer: Self-pay | Admitting: Internal Medicine

## 2023-10-09 IMAGING — MG DIGITAL DIAGNOSTIC BILAT W/ TOMO W/ CAD
8 series · 8 of 24 positions shown · non-contrast
Comparison: Previous exams, most recently 06/18/2019.

CLINICAL DATA: 50-year-old with what was felt to represent a mass
in the UPPER OUTER QUADRANT of the LEFT breast at 1 o'clock 1 cm
from nipple and abnormal LEFT axillary lymph nodes at the time of
her most recent imaging in June 2019. The patient was recommended
for biopsy at that time, but returns now for follow-up.

EXAM:
DIGITAL DIAGNOSTIC BILATERAL MAMMOGRAM WITH TOMOSYNTHESIS AND CAD;
ULTRASOUND LEFT BREAST LIMITED
TECHNIQUE: Bilateral digital diagnostic mammography and breast tomosynthesis
was performed. The images were evaluated with computer-aided
detection.; Targeted ultrasound examination of the left breast was
performed.

[L MLO synth-2D]
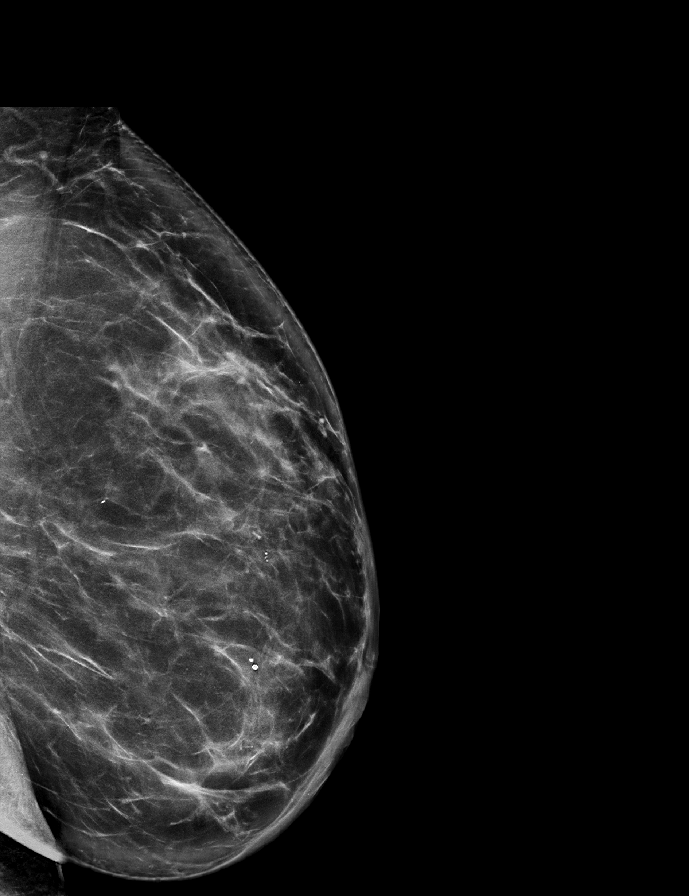

[R CC synth-2D]
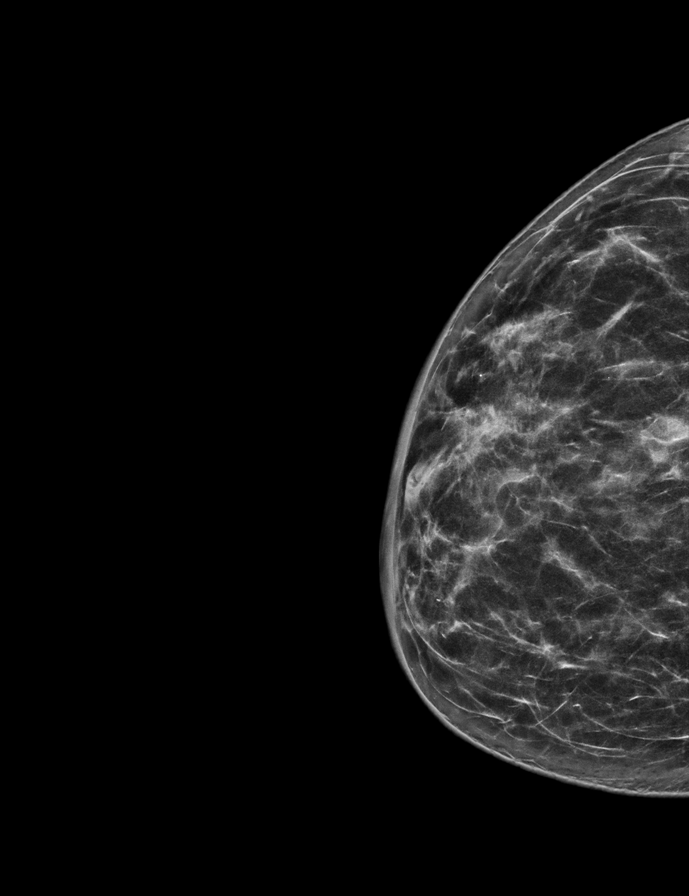

[L CC synth-2D]
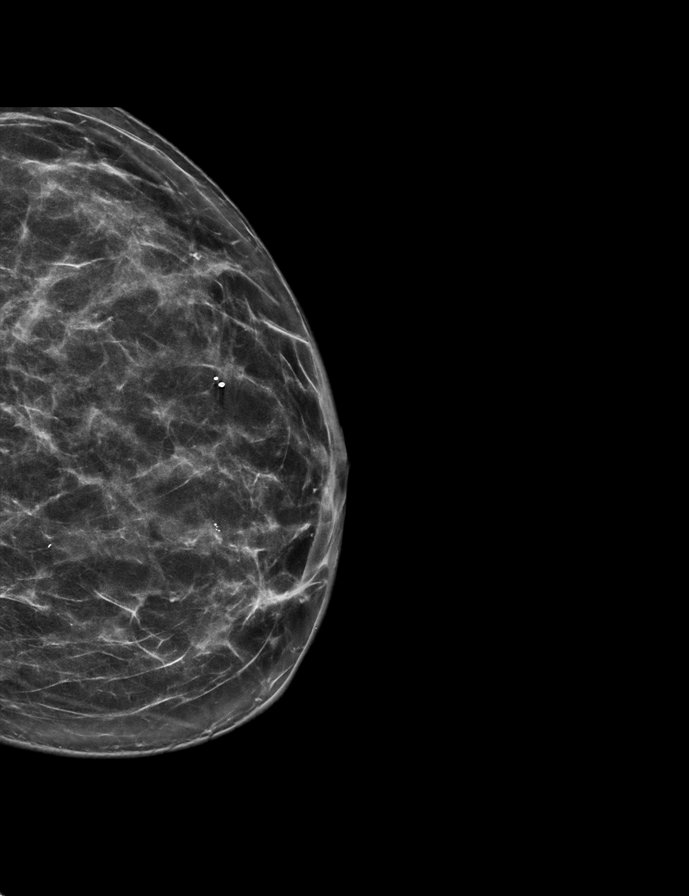

[R MLO synth-2D]
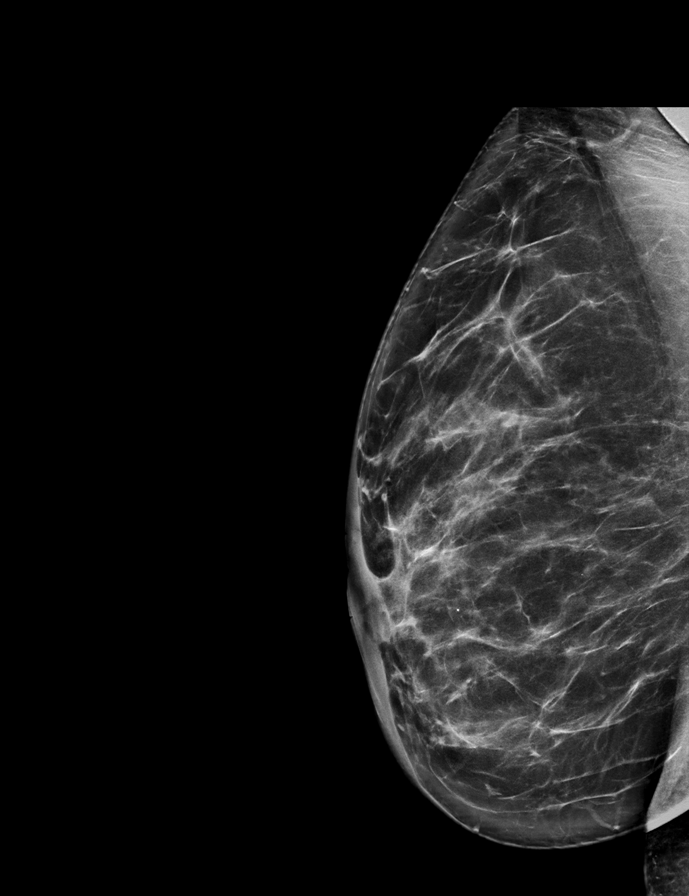

[R MLO tomo · tomo slice 41/81.0]
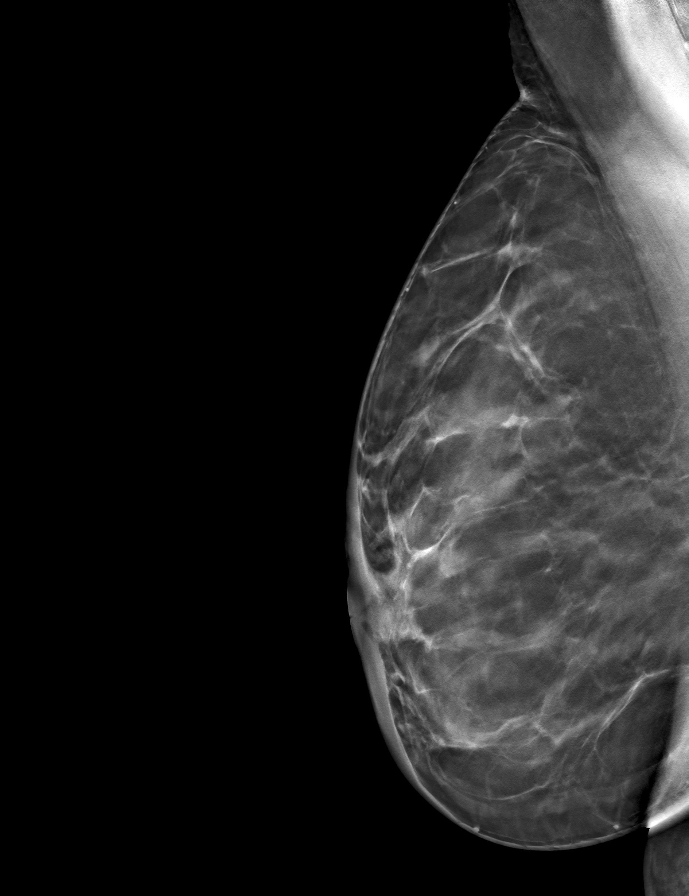

[L CC tomo · tomo slice 36/71.0]
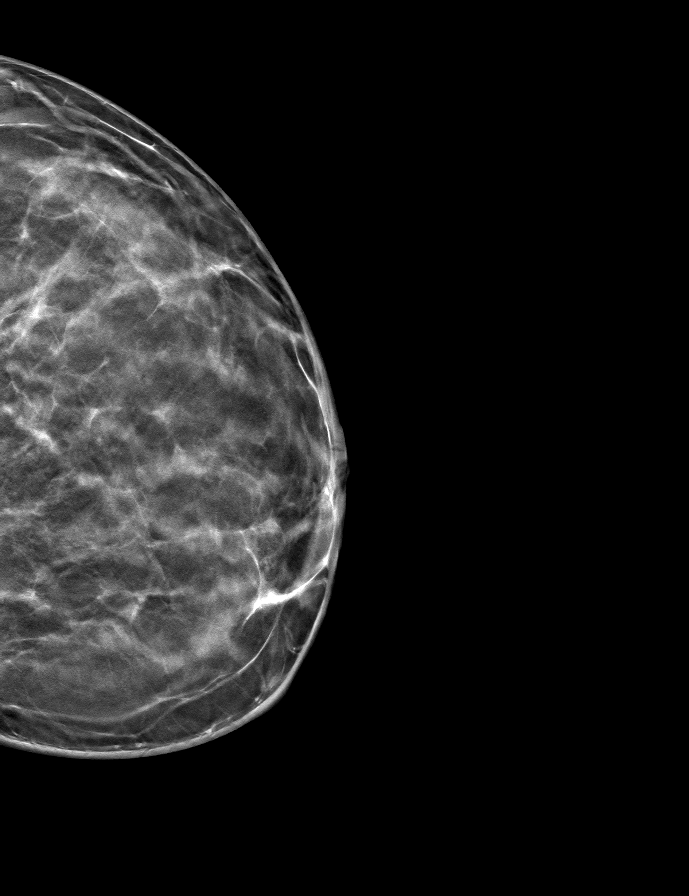

[R CC tomo · tomo slice 33/65.0]
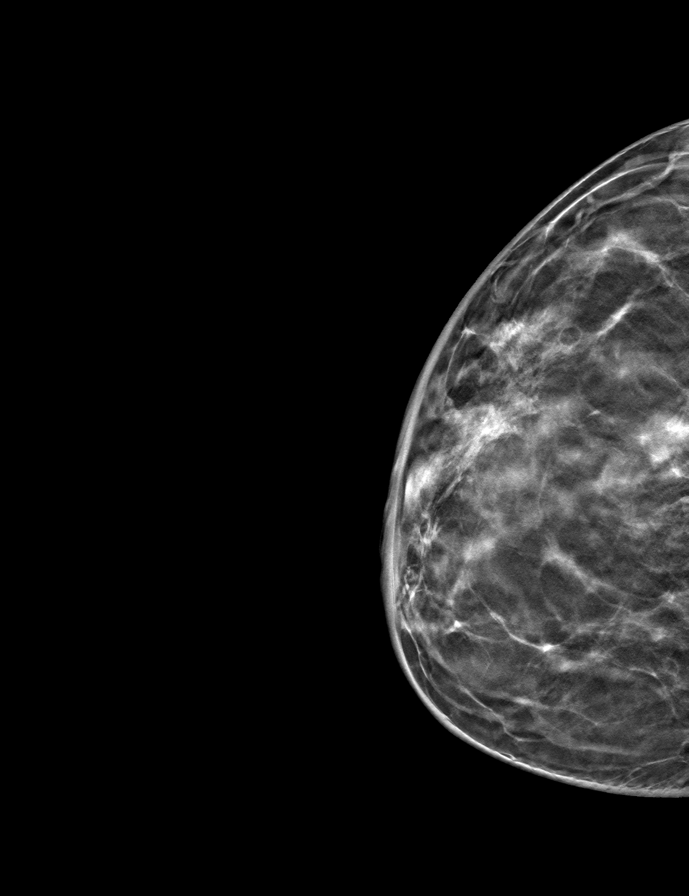

[L MLO tomo · tomo slice 41/82.0]
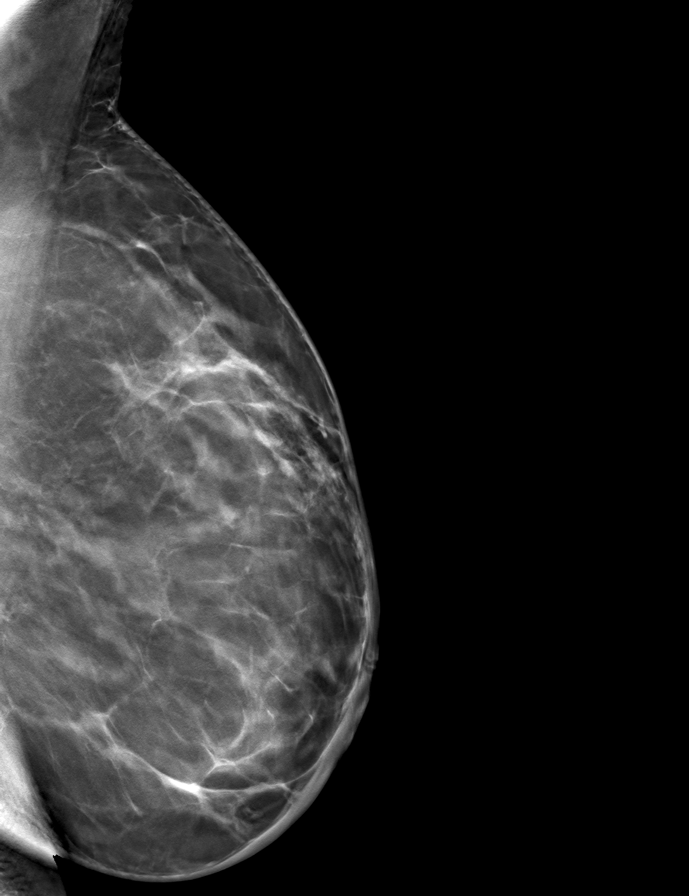

[8 of 24 positions shown; findings below may reference images not displayed]

ACR Breast Density Category b: There are scattered areas of
fibroglandular density.
FINDINGS: Full field CC and MLO views of both breasts were obtained.

RIGHT: No findings suspicious for malignancy.

LEFT: The mass identified on the prior ultrasound in June 2019 is
inconspicuous on mammography. No new or suspicious findings.

Targeted ultrasound is performed, demonstrating that the previously
identified abnormality at the 1 o'clock position 1 cm from the
nipple axillary represented a dilated duct containing debris, as the
duct is now decompressed and only minimally dilated. There is no
mass in this location.

Sonographic evaluation of the LEFT axilla demonstrates that the
previously identified abnormal LEFT axillary lymph nodes have
normalized. There is currently no pathologic lymphadenopathy.
IMPRESSION: 1. No mammographic or sonographic evidence of malignancy involving
the LEFT breast.
2. No mammographic evidence of malignancy involving the RIGHT
breast.
3. No pathologic LEFT axillary lymphadenopathy.

RECOMMENDATION:
Screening mammogram in one year.(Code:[id number]-[id number])

I have discussed the findings and recommendations with the patient.
If applicable, a reminder letter will be sent to the patient
regarding the next appointment.

BI-RADS CATEGORY  2: Benign.

## 2023-12-02 NOTE — Progress Notes (Unsigned)
 BH MD Outpatient Progress Note  12/03/2023 2:23 PM Betsabe Iglesia  MRN:  [id number]  Assessment:  Sandra Bell presents for follow-up evaluation by video. Today, 12/03/23, patient reports continued psychiatric stability and notes that AH remain at baseline. She denies related distress or dysfunction and reports intact self-care, engagement in exercise and meditation, and adherence to medications. She has been proactively working with [facility] case Production designer, theatre/television/film to consider potential housing options in the future (currently living with mother who is > 17 years old). No changes to regimen at this time.   Patient was made aware of this provider's departure from [facility] at the end of Nov 2025 and that she will be transitioned to alternative provider in the clinic after this time. All questions/concerns addressed.  RTC in 3 months by video with next provider.  Identifying Information: Sandra Bell is a 53 y.o. female with a history of schizoaffective disorder, HTN, HLD, and Vitamin D  deficiency who is an established patient with [facility] for management of schizoaffective disorder. At her baseline, she experiences daily self derogatory auditory hallucinations although she is fairly easily able to ignore these voices without disruption to routine or leading to significant distress. She has preferred to remain at current low-dose Abilify  although has been reminded further titration can be pursued in the future if desired or indicated.  Plan:  # Schizoaffective disorder, depressive type Past medication trials: Invega  PO; Invega  Trinza Status of problem: stable Interventions: -- Continue Abilify  2.5 mg nightly (patient thus far has declined titration to higher dosing; prefers nighttime dosing) -- Patient previously inquired into group home and supportive living options as she currently lives with her mother who is > 32 years old; patient now connected with [employer] case  Production designer, theatre/television/film and they have been exploring options for [facility] community-based supported housing  # Vitamin D  deficiency Interventions: -- Vitamin D  8.1 02/27/23 -> improved to 22.6 04/16/23; PCP has increased Vitamin D3 to 2000 IU daily since Feb 2025  # Medication monitoring Interventions: -- Lipid profile 02/27/23 wnl -- Hgb A1c 02/27/23 wnl -- While on Invega , experienced elevated PRL levels: 142 (04/12/21) --> 108 (09/25/21) --> 50.6 (01/25/22) -> 35.2 (06/22/22) -> 23.2 (02/27/23) -- Due to normalization of PRL and resolution of symptoms, will not continue to check unless signs/sx of hyperPRL re-emerge   Patient was given contact information for behavioral health clinic and was instructed to call 911 for emergencies.   Subjective:  Chief Complaint:  Chief Complaint  Patient presents with   Medication Management    Interval History:   Patient reports she has been doing pretty good and mood remains stable. Denies persistently low mood, anxiety, or irritability. Reports persistent AH but unchanged from baseline and not leading to any distress/dysfunction at this time -  I've learned to accept and ignore them. Denies SI/HI.   Following vegetarian DASH diet and has seen improvement in her blood pressure. Continues to engage in yoga and pilates; meditation. Sleeping well.   Has been speaking with [facility] about [facility] housing - she may consider this option in 2027. Receiving transportation assistance from them.   Endorses adherence to Abilify  and amenable to continuing as prescribed. Notes that this low-dose of Abilify  balances efficacy and tolerability well - not experiencing sedation as she did on previous antipsychotics.    Visit Diagnosis:    ICD-10-CM   1. Schizoaffective disorder, depressive type (HCC)  F25.1 ARIPiprazole  (ABILIFY ) 5 MG tablet       Past Psychiatric  History:  Diagnoses: schizoaffective disorder, depressive type Medication trials:  Invega  PO  (dizziness, abd pain, hyperPRL); Invega  Trinza (hyperPRL with galactorrhea) Hospitalizations: last psychiatric hospitalization in 2011 Suicide attempts: denies SIB: denies Current access to guns: denies Substance use: denies use of etoh, tobacco, or illicit drugs Living: lives with her mother; patient manages medications independently  Past Medical History:  Past Medical History:  Diagnosis Date   Hypertension    Schizoaffective disorder (HCC) 2003    Past Surgical History:  Procedure Laterality Date   FINGER SURGERY  1983   Family Psychiatric History: denies  Family History:  Family History  Problem Relation Age of Onset   Hyperlipidemia Mother    Hypertension Mother    Hyperlipidemia Father    Hypertension Father    Heart disease Father    Breast cancer Sister     Social History:  Social History   Socioeconomic History   Marital status: Divorced    Spouse name: Not on file   Number of children: Not on file   Years of education: Not on file   Highest education level: Some college, no degree  Occupational History   Not on file  Tobacco Use   Smoking status: Never   Smokeless tobacco: Never  Vaping Use   Vaping status: Never Used  Substance and Sexual Activity   Alcohol use: No   Drug use: No   Sexual activity: Not Currently  Other Topics Concern   Not on file  Social History Narrative   Not on file   Social Drivers of Health   Financial Resource Strain: Low Risk  (09/23/2023)   Overall Financial Resource Strain (CARDIA)    Difficulty of Paying Living Expenses: Not very hard  Food Insecurity: No Food Insecurity (09/23/2023)   Hunger Vital Sign    Worried About Running Out of Food in the Last Year: Never true    Ran Out of Food in the Last Year: Never true  Transportation Needs: No Transportation Needs (09/23/2023)   PRAPARE - Administrator, Civil Service (Medical): No    Lack of Transportation (Non-Medical): No  Physical Activity:  Insufficiently Active (09/23/2023)   Exercise Vital Sign    Days of Exercise per Week: 7 days    Minutes of Exercise per Session: 10 min  Stress: No Stress Concern Present (09/23/2023)   [employer] of Occupational Health - Occupational Stress Questionnaire    Feeling of Stress: Not at all  Social Connections: Moderately Isolated (09/23/2023)   Social Connection and Isolation Panel    Frequency of Communication with Friends and Family: Once a week    Frequency of Social Gatherings with Friends and Family: Once a week    Attends Religious Services: 1 to 4 times per year    Active Member of [employer] or Organizations: Yes    Attends Banker Meetings: 1 to 4 times per year    Marital Status: Divorced    Allergies: No Known Allergies  Current Medications: Current Outpatient Medications  Medication Sig Dispense Refill   amLODipine  (NORVASC ) 10 MG tablet Take 1 tablet (10 mg total) by mouth daily. 90 tablet 2   ARIPiprazole  (ABILIFY ) 5 MG tablet Take 0.5 tablets (2.5 mg total) by mouth at bedtime. 45 tablet 1   atorvastatin  (LIPITOR) 10 MG tablet Take 1 tablet (10 mg total) by mouth daily. 90 tablet 2   cholecalciferol  (VITAMIN D3) 25 MCG (1000 UNIT) tablet Take 2,000 Units by mouth daily.  No current facility-administered medications for this visit.    ROS: Denies physical complaints; reports BM daily and denies constipation  Objective:  Psychiatric Specialty Exam: There were no vitals taken for this visit.There is no height or weight on file to calculate BMI.  General Appearance: Casual and Well Groomed  Eye Contact:  Good  Speech:  Clear and Coherent and Normal Rate  Volume:  Normal  Mood:  pretty good  Affect:  Constricted and Euthymic; pleasant  Thought Content: Endorses daily self-derogatory AH that remain at baseline; denies CAH. Denies VH.   Suicidal Thoughts:  No  Homicidal Thoughts:  No  Thought Process:  Goal Directed and Linear  Orientation:  Full  (Time, Place, and Person)    Memory:  Grossly intact  Judgment:  Good  Insight:  Fair  Concentration:  Concentration: Good  Recall:  NA  Fund of Knowledge: Good  Language: Good  Psychomotor Activity:  Normal  Akathisia:  No  AIMS (if indicated): not done  Assets:  Communication Skills Desire for Improvement Housing Leisure Time Physical Health Social Support Transportation  ADL's:  Intact  Cognition: WNL  Sleep:  Good   PE: General: sits comfortably in view of camera; no acute distress  Pulm: no increased work of breathing on room air  MSK: all extremity movements appear intact  Neuro: no focal neurological deficits observed  Gait & Station: unable to assess by video   Metabolic Disorder Labs: Lab Results  Component Value Date   HGBA1C 4.9 02/27/2023   Lab Results  Component Value Date   PROLACTIN 23.2 02/27/2023   PROLACTIN 35.2 (H) 06/22/2022   Lab Results  Component Value Date   CHOL 172 02/27/2023   TRIG 64 02/27/2023   HDL 90 02/27/2023   CHOLHDL 1.9 02/27/2023   VLDL 15 10/17/2015   LDLCALC 70 02/27/2023   LDLCALC 81 08/30/2021   Lab Results  Component Value Date   TSH 2.310 06/22/2022   TSH 1.750 04/01/2018    Therapeutic Level Labs: No results found for: LITHIUM No results found for: VALPROATE No results found for: CBMZ  Screenings: AIMS    Flowsheet Row Office Visit from 05/30/2021 in [facility]  AIMS Total Score 0   GAD-7    Flowsheet Row Office Visit from 04/16/2023 in [facility] [place] - A Dept Of [place]. [facility] Counselor from 01/17/2023 in [facility] Office Visit from 12/11/2022 in [facility] [place] - A Dept Of [place]. [facility] Office Visit from 07/31/2022 in [facility] [place] - A Dept Of [place]. [facility] Office Visit from 03/27/2022 in [facility] [place] - A Dept Of [place].  [facility]  Total GAD-7 Score 1 0 0 1 1   PHQ2-9    Flowsheet Row Office Visit from 09/26/2023 in [facility] [place] - A Dept Of [place]. [facility] Office Visit from 04/16/2023 in [facility] - A Dept Of [place]. [facility] Counselor from 01/17/2023 in [facility] Office Visit from 12/11/2022 in [facility] [place] - A Dept Of . [facility] Office Visit from 07/31/2022 in [facility] [place] - A Dept Of [place]. [facility]  PHQ-2 Total Score 0 0 0 0 0  PHQ-9 Total Score 0 0 -- 0 0  Flowsheet Row Counselor from 01/17/2023 in [facility]  C-SSRS RISK CATEGORY No Risk    Collaboration of Care: Collaboration of Care: Medication Management AEB ongoing medication management and Psychiatrist AEB established with this provider  Patient/Guardian was advised Release of Information must be obtained prior to any record release in order to collaborate their care with an outside provider. Patient/Guardian was advised if they have not already done so to contact the registration department to sign all necessary forms in order for us  to release information regarding their care.   Consent: Patient/Guardian gives verbal consent for treatment and assignment of benefits for services provided during this visit. Patient/Guardian expressed understanding and agreed to proceed.   Virtual Visit via Video Note  I connected with Corean Sharps on 12/03/23 at  2:00 PM EDT by a video enabled telemedicine application and verified that I am speaking with the correct person using two identifiers.  Location: Patient: home address in [place] Provider: clinic   I discussed the limitations of evaluation and management by telemedicine and the availability of in person appointments. The patient expressed understanding and agreed to proceed.  I discussed the  assessment and treatment plan with the patient. The patient was provided an opportunity to ask questions and all were answered. The patient agreed with the plan and demonstrated an understanding of the instructions.   The patient was advised to call back or seek an in-person evaluation if the symptoms worsen or if the condition fails to improve as anticipated.  I provided 35 minutes dedicated to the care of this patient via video on the date of this encounter to include chart review, face-to-face time with the patient, medication management/counseling.  Accalia Rigdon A Omunique Pederson 12/03/2023, 2:23 PM

## 2023-12-03 ENCOUNTER — Encounter (HOSPITAL_COMMUNITY): Payer: Self-pay | Admitting: Psychiatry

## 2023-12-03 ENCOUNTER — Other Ambulatory Visit: Payer: Self-pay

## 2023-12-03 ENCOUNTER — Telehealth (INDEPENDENT_AMBULATORY_CARE_PROVIDER_SITE_OTHER): Payer: MEDICAID | Admitting: Psychiatry

## 2023-12-03 DIAGNOSIS — F251 Schizoaffective disorder, depressive type: Secondary | ICD-10-CM | POA: Diagnosis not present

## 2023-12-03 MED ORDER — ARIPIPRAZOLE 5 MG PO TABS
2.5000 mg | ORAL_TABLET | Freq: Every evening | ORAL | 1 refills | Status: DC
  Filled 2023-12-03 – 2023-12-11 (×2): qty 45, 90d supply, fill #0

## 2023-12-03 NOTE — Patient Instructions (Signed)

## 2023-12-04 ENCOUNTER — Telehealth (HOSPITAL_COMMUNITY): Payer: Self-pay

## 2023-12-04 NOTE — Telephone Encounter (Signed)
 Patient called in and left voicemail. Per patient she is wondering if the Vitamin D3 over the counter medication she is taking can be prescribed. Pt is inquiring about this with hopes her insurance can pay for it.

## 2023-12-05 ENCOUNTER — Other Ambulatory Visit: Payer: Self-pay

## 2023-12-05 ENCOUNTER — Other Ambulatory Visit (HOSPITAL_COMMUNITY): Payer: Self-pay | Admitting: Psychiatry

## 2023-12-05 MED ORDER — VITAMIN D3 50 MCG (2000 UT) PO TABS
2000.0000 [IU] | ORAL_TABLET | Freq: Every day | ORAL | 5 refills | Status: AC
  Filled 2023-12-05 – 2023-12-11 (×2): qty 30, 30d supply, fill #0
  Filled 2024-01-10: qty 30, 30d supply, fill #1
  Filled 2024-02-12: qty 100, 100d supply, fill #2
  Filled 2024-02-12: qty 30, 30d supply, fill #2

## 2023-12-09 ENCOUNTER — Other Ambulatory Visit: Payer: Self-pay

## 2023-12-11 ENCOUNTER — Other Ambulatory Visit: Payer: Self-pay

## 2023-12-17 ENCOUNTER — Other Ambulatory Visit (HOSPITAL_COMMUNITY): Payer: Self-pay

## 2023-12-17 ENCOUNTER — Other Ambulatory Visit: Payer: Self-pay

## 2024-01-06 ENCOUNTER — Other Ambulatory Visit: Payer: Self-pay

## 2024-01-10 ENCOUNTER — Other Ambulatory Visit: Payer: Self-pay

## 2024-01-14 ENCOUNTER — Other Ambulatory Visit: Payer: Self-pay

## 2024-01-27 ENCOUNTER — Telehealth: Payer: Self-pay | Admitting: Internal Medicine

## 2024-01-27 NOTE — Telephone Encounter (Signed)
 Confirmed appt for 11/18

## 2024-01-28 ENCOUNTER — Ambulatory Visit: Payer: MEDICAID | Admitting: Internal Medicine

## 2024-01-28 ENCOUNTER — Encounter: Payer: Self-pay | Admitting: Internal Medicine

## 2024-01-28 ENCOUNTER — Ambulatory Visit: Payer: MEDICAID | Attending: Internal Medicine | Admitting: Internal Medicine

## 2024-01-28 VITALS — BP 116/75 | HR 93 | Temp 97.7°F | Ht 64.0 in | Wt 128.0 lb

## 2024-01-28 DIAGNOSIS — E559 Vitamin D deficiency, unspecified: Secondary | ICD-10-CM

## 2024-01-28 DIAGNOSIS — Z2821 Immunization not carried out because of patient refusal: Secondary | ICD-10-CM

## 2024-01-28 DIAGNOSIS — I10 Essential (primary) hypertension: Secondary | ICD-10-CM

## 2024-01-28 DIAGNOSIS — E782 Mixed hyperlipidemia: Secondary | ICD-10-CM

## 2024-01-28 DIAGNOSIS — F251 Schizoaffective disorder, depressive type: Secondary | ICD-10-CM | POA: Diagnosis not present

## 2024-01-28 NOTE — Progress Notes (Signed)
 Patient ID: Sandra Bell, female    DOB: [birth year]  MRN: [id number]  CC: Hypertension (HTN f/u. /No questions / concerns/No to all vax)   Subjective: Sandra Bell is a 53 y.o. female who presents for chronic ds management. Her concerns today include:  Pt with hx of HTN, HL, schizoaffective disorder, Fhx of breast CA in sister (reportedly tested neg for BRCA), elev Prolactin level due to Invega    Discussed the use of AI scribe software for clinical note transcription with the patient, who gave verbal consent to proceed.  History of Present Illness   Sandra Bell is a 53 year old female who presents for a four-month follow-up visit.  She has hypertension and continues to take amlodipine  10 mg daily. No chest pain, shortness of breath, leg swelling, chronic headaches, or dizziness. She monitors her blood pressure once or twice a week, with systolic readings ranging from 101 to 125 and diastolic readings from 71 to 80. Her blood pressure was 106/72 at home this morning, but was 135/78 upon arrival at the office, which she attributes to being higher at the start of office visits. She follows a vegetarian DASH diet, having eliminated fish due to nausea, and incorporates beans, rice, eggs (three per week), and moderate amounts of cottage cheese for protein. She has been following this diet for approximately two months. She engages in yoga and Pilates five days a week as part of her exercise routine.   She continues to take atorvastatin  10 mg daily for cholesterol management. She is due for blood tests today, as her last cholesterol and blood cell count were checked in December of last year.  She is managing vitamin D  deficiency with 2000 IU of vitamin D  daily, with the last level checked in February of this year.  She is followed by [facility] for schizoaffective disorder and has a virtual appointment scheduled for December 18th. She takes Abilify  5 mg, half a  tablet at night, and reports doing well on this regimen.        Patient Active Problem List   Diagnosis Date Noted   Tetanus, diphtheria, and acellular pertussis (Tdap) vaccination declined 07/31/2022   High risk medication use 06/19/2022   Hyperprolactinemia 03/27/2022   History of lump of left breast 05/18/2021   Family history of breast cancer 05/18/2021   History of hyperprolactinemia 05/18/2021   Breast lump 03/17/2020   Essential hypertension 02/05/2012   Schizoaffective disorder, depressive type (HCC) 01/03/2012   HLD (hyperlipidemia) 01/03/2012     Current Outpatient Medications on File Prior to Visit  Medication Sig Dispense Refill   amLODipine  (NORVASC ) 10 MG tablet Take 1 tablet (10 mg total) by mouth daily. 90 tablet 2   ARIPiprazole  (ABILIFY ) 5 MG tablet Take 0.5 tablets (2.5 mg total) by mouth at bedtime. 45 tablet 1   atorvastatin  (LIPITOR) 10 MG tablet Take 1 tablet (10 mg total) by mouth daily. 90 tablet 2   Cholecalciferol  (VITAMIN D3) 50 MCG (2000 UT) TABS Take 1 tablet (2,000 Units total) by mouth daily. 30 tablet 5   No current facility-administered medications on file prior to visit.    No Known Allergies  Social History   Socioeconomic History   Marital status: Divorced    Spouse name: Not on file   Number of children: Not on file   Years of education: Not on file   Highest education level: Some college, no degree  Occupational History   Not on file  Tobacco  Use   Smoking status: Never   Smokeless tobacco: Never  Vaping Use   Vaping status: Never Used  Substance and Sexual Activity   Alcohol use: No   Drug use: No   Sexual activity: Not Currently  Other Topics Concern   Not on file  Social History Narrative   Not on file   Social Drivers of Health   Financial Resource Strain: Low Risk  (01/24/2024)   Overall Financial Resource Strain (CARDIA)    Difficulty of Paying Living Expenses: Not very hard  Food Insecurity: No Food Insecurity  (01/24/2024)   Hunger Vital Sign    Worried About Running Out of Food in the Last Year: Never true    Ran Out of Food in the Last Year: Never true  Transportation Needs: No Transportation Needs (01/24/2024)   PRAPARE - Administrator, Civil Service (Medical): No    Lack of Transportation (Non-Medical): No  Physical Activity: Insufficiently Active (01/24/2024)   Exercise Vital Sign    Days of Exercise per Week: 5 days    Minutes of Exercise per Session: 10 min  Stress: No Stress Concern Present (01/24/2024)   [employer] of Occupational Health - Occupational Stress Questionnaire    Feeling of Stress: Not at all  Social Connections: Unknown (01/24/2024)   Social Connection and Isolation Panel    Frequency of Communication with Friends and Family: Once a week    Frequency of Social Gatherings with Friends and Family: Patient declined    Attends Religious Services: 1 to 4 times per year    Active Member of [employer] or Organizations: Yes    Attends Banker Meetings: 1 to 4 times per year    Marital Status: Divorced  Catering Manager Violence: Not At Risk (04/16/2023)   Humiliation, Afraid, Rape, and Kick questionnaire    Fear of Current or Ex-Partner: No    Emotionally Abused: No    Physically Abused: No    Sexually Abused: No    Family History  Problem Relation Age of Onset   Hyperlipidemia Mother    Hypertension Mother    Hyperlipidemia Father    Hypertension Father    Heart disease Father    Breast cancer Sister     Past Surgical History:  Procedure Laterality Date   FINGER SURGERY  1983    ROS: Review of Systems Negative except as stated above  PHYSICAL EXAM: BP 116/75   Pulse 93   Temp 97.7 F (36.5 C) (Oral)   Ht 5' 4 (1.626 m)   Wt 128 lb (58.1 kg)   LMP  (LMP Unknown)   SpO2 100%   BMI 21.97 kg/m   Wt Readings from Last 3 Encounters:  01/28/24 128 lb (58.1 kg)  09/26/23 134 lb (60.8 kg)  04/16/23 135 lb (61.2 kg)     Physical Exam  General appearance - alert, well appearing, middle age AAF and in no distress Mental status - normal mood, behavior, speech and thought processes.  Neck - supple, no significant adenopathy Chest - clear to auscultation, no wheezes, rales or rhonchi, symmetric air entry Heart - normal rate, regular rhythm, normal S1, S2, no murmurs, rubs, clicks or gallops Extremities - peripheral pulses normal, no pedal edema, no clubbing or cyanosis      Latest Ref Rng & Units 06/22/2022    8:41 AM 08/30/2021    9:04 AM 04/12/2021    5:22 PM  CMP  Glucose 70 - 99 mg/dL 90  81   BUN 6 - 24 mg/dL 9   7   Creatinine 9.42 - 1.00 mg/dL 9.14   8.97   Sodium 865 - 144 mmol/L 139   142   Potassium 3.5 - 5.2 mmol/L 3.8   4.0   Chloride 96 - 106 mmol/L 102   102   CO2 20 - 29 mmol/L 24     Calcium  8.7 - 10.2 mg/dL 89.9   89.7   Total Protein 6.0 - 8.5 g/dL 7.6  7.5  7.9   Total Bilirubin 0.0 - 1.2 mg/dL 0.9  0.8  0.7   Alkaline Phos 44 - 121 IU/L 76  66  72   AST 0 - 40 IU/L 25  15  16    ALT 0 - 32 IU/L 19  13     Lipid Panel     Component Value Date/Time   CHOL 172 02/27/2023 0958   TRIG 64 02/27/2023 0958   HDL 90 02/27/2023 0958   CHOLHDL 1.9 02/27/2023 0958   CHOLHDL 2.7 10/17/2015 0959   VLDL 15 10/17/2015 0959   LDLCALC 70 02/27/2023 0958    CBC    Component Value Date/Time   WBC 3.8 02/27/2023 0958   WBC 5.5 11/22/2014 0850   RBC 4.05 02/27/2023 0958   RBC 4.10 11/22/2014 0850   HGB 12.6 02/27/2023 0958   HCT 38.5 02/27/2023 0958   PLT 361 02/27/2023 0958   MCV 95 02/27/2023 0958   MCH 31.1 02/27/2023 0958   MCH 32.4 11/22/2014 0850   MCHC 32.7 02/27/2023 0958   MCHC 33.8 11/22/2014 0850   RDW 11.7 02/27/2023 0958   LYMPHSABS 2.6 02/27/2023 0958   MONOABS 0.6 11/22/2014 0850   EOSABS 0.0 02/27/2023 0958   BASOSABS 0.0 02/27/2023 0958    ASSESSMENT AND PLAN: 1. Essential hypertension (Primary) Controlled Continue Norvasc  10 mg daily Continue healthy  eating and regular exercise - CBC - Comprehensive metabolic panel with GFR  2. Mixed hyperlipidemia Continue Lipitor 10 mg daily - Lipid panel  3. Vitamin D  deficiency Continue Vit D 2000 international units  OTC Since she has gone full vegetarian, advised to take a MV tab daily as vegetarians are at risk for IDA and B12 def - VITAMIN D  25 Hydroxy (Vit-D Deficiency, Fractures)  4. Schizoaffective disorder, depressive type (HCC) Stable on Abilify  and [facility]  5. Influenza vaccination declined Recommended. Pt declined   Patient was given the opportunity to ask questions.  Patient verbalized understanding of the plan and was able to repeat key elements of the plan.   This documentation was completed using Paediatric nurse.  Any transcriptional errors are unintentional.  Orders Placed This Encounter  Procedures   CBC   Comprehensive metabolic panel with GFR   Lipid panel   VITAMIN D  25 Hydroxy (Vit-D Deficiency, Fractures)     Requested Prescriptions    No prescriptions requested or ordered in this encounter    Return in about 4 months (around 05/27/2024).  Barnie Louder, MD, FACP

## 2024-01-28 NOTE — Patient Instructions (Signed)
  VISIT SUMMARY: During your follow-up visit, we reviewed your current health status and ongoing treatments. Your blood pressure is well-controlled with your current medication and lifestyle choices. We also discussed your cholesterol management, vitamin D  supplementation, and mental health treatment. Blood tests were ordered to monitor your cholesterol, kidney and liver function, blood count, and vitamin D  levels.  YOUR PLAN: -ESSENTIAL HYPERTENSION: Essential hypertension is high blood pressure with no identifiable cause. Your blood pressure is well-controlled with amlodipine  10 mg daily, a low-salt diet, and regular exercise. Continue with your current regimen and monitor your blood pressure regularly.  -MIXED HYPERLIPIDEMIA: Mixed hyperlipidemia is a condition with high levels of cholesterol and other fats in the blood. You are managing this with atorvastatin  10 mg daily. Blood tests have been ordered to check your cholesterol, kidney and liver function, and blood count.  -VITAMIN D  DEFICIENCY: Vitamin D  deficiency means you have lower than normal levels of vitamin D , which is important for bone health. You are taking 2000 IU of vitamin D  daily. A blood test has been ordered to check your vitamin D  levels.  -SCHIZOAFFECTIVE DISORDER, DEPRESSIVE TYPE: Schizoaffective disorder is a mental health condition that includes symptoms of both schizophrenia and mood disorders. You are managing this with Abilify  5 mg (half tablet) at night and have a follow-up appointment scheduled with [facility].  INSTRUCTIONS: Please continue with your current medications and lifestyle choices. Follow up with the ordered blood tests for cholesterol, kidney and liver function, blood count, and vitamin D  levels. Keep your scheduled appointment with [facility] on December 18th.                      Contains text generated by Abridge.                                  Contains text generated by Abridge.

## 2024-01-29 ENCOUNTER — Ambulatory Visit: Payer: Self-pay | Admitting: Internal Medicine

## 2024-01-29 ENCOUNTER — Other Ambulatory Visit: Payer: Self-pay | Admitting: Internal Medicine

## 2024-01-29 LAB — LIPID PANEL
Chol/HDL Ratio: 2.1 ratio (ref 0.0–4.4)
Cholesterol, Total: 193 mg/dL (ref 100–199)
HDL: 94 mg/dL (ref >39)
LDL Chol Calc (NIH): 88 mg/dL (ref 0–99)
Triglycerides: 56 mg/dL (ref 0–149)
VLDL Cholesterol Cal: 11 mg/dL (ref 5–40)

## 2024-01-29 LAB — COMPREHENSIVE METABOLIC PANEL WITH GFR
ALT: 17 IU/L (ref 0–32)
AST: 25 IU/L (ref 0–40)
Albumin: 5 g/dL — ABNORMAL HIGH (ref 3.8–4.9)
Alkaline Phosphatase: 79 IU/L (ref 49–135)
BUN/Creatinine Ratio: 9 (ref 9–23)
BUN: 9 mg/dL (ref 6–24)
Bilirubin Total: 0.8 mg/dL (ref 0.0–1.2)
CO2: 23 mmol/L (ref 20–29)
Calcium: 10.7 mg/dL — ABNORMAL HIGH (ref 8.7–10.2)
Chloride: 102 mmol/L (ref 96–106)
Creatinine, Ser: 0.99 mg/dL (ref 0.57–1.00)
Globulin, Total: 2.9 g/dL (ref 1.5–4.5)
Glucose: 78 mg/dL (ref 70–99)
Potassium: 4.4 mmol/L (ref 3.5–5.2)
Sodium: 141 mmol/L (ref 134–144)
Total Protein: 7.9 g/dL (ref 6.0–8.5)
eGFR: 67 mL/min/1.73 (ref >59)

## 2024-01-29 LAB — CBC
Hematocrit: 40.2 % (ref 34.0–46.6)
Hemoglobin: 13 g/dL (ref 11.1–15.9)
MCH: 31.6 pg (ref 26.6–33.0)
MCHC: 32.3 g/dL (ref 31.5–35.7)
MCV: 98 fL — ABNORMAL HIGH (ref 79–97)
Platelets: 350 x10E3/uL (ref 150–450)
RBC: 4.12 x10E6/uL (ref 3.77–5.28)
RDW: 11.8 % (ref 11.7–15.4)
WBC: 5 x10E3/uL (ref 3.4–10.8)

## 2024-01-29 LAB — VITAMIN D 25 HYDROXY (VIT D DEFICIENCY, FRACTURES): Vit D, 25-Hydroxy: 50.5 ng/mL (ref 30.0–100.0)

## 2024-02-12 ENCOUNTER — Other Ambulatory Visit: Payer: Self-pay

## 2024-02-13 ENCOUNTER — Other Ambulatory Visit: Payer: Self-pay

## 2024-02-25 NOTE — Progress Notes (Addendum)
 BH MD Outpatient Progress Note  02/27/2024 2:20 PM Sandra Bell  MRN:  [id number]  Televisit via video: I connected with Sandra Bell on 02/27/2024 at  2:00 PM EST by a video enabled telemedicine application and verified that I am speaking with the correct person using two identifiers.  Location: Patient: home in [place] Provider: remote office in [place]   I discussed the limitations of evaluation and management by telemedicine and the availability of in person appointments. The patient expressed understanding and agreed to proceed.  I discussed the assessment and treatment plan with the patient. The patient was provided an opportunity to ask questions and all were answered. The patient agreed with the plan and demonstrated an understanding of the instructions.   The patient was advised to call back or seek an in-person evaluation if the symptoms worsen or if the condition fails to improve as anticipated.  Assessment:  Sandra Bell presents for follow-up evaluation by video. Today, 02/27/2024, patient reports continued psychiatric stability as evidenced by her stable auditory hallucinations for her schizoaffective disorder depressive type. Given her continued stability, we discussed that the benefits of staying on her abilify  at low dose of 2.5mg  outweigh the risks of potential increased sedation and potential brain fog. We discussed that she can contact the clinic if she feels that the auditory hallucinations are increased and intolerable and at that time we can consider an increase in her abilify . We discussed obtaining A1c for medication monitoring on abilify . She denies current side effects to the abilify  and has continued intact occupational and social functioning as evidenced by continued self-care and working with [facility] case production designer, theatre/television/film regarding independent living housing options in the future.   Identifying Information: Sandra Bell is a 53 y.o. female with a history of  schizoaffective disorder, HTN, HLD, and Vitamin D  deficiency who is an established patient with [facility] for management of schizoaffective disorder. At her baseline, she experiences daily self derogatory auditory hallucinations although she is fairly easily able to ignore these voices without disruption to routine or leading to significant distress. She has preferred to remain at current low-dose Abilify  although has been reminded further titration can be pursued in the future if desired or indicated.  Plan:  # Schizoaffective disorder, depressive type Past medication trials: Invega  PO; Invega  Trinza Status of problem: stable Interventions: -- Continue Abilify  2.5 mg nightly  # Vitamin D  deficiency Interventions: -- Vitamin D  8.1 02/27/23 -> improved to 22.6 04/16/23; PCP has increased Vitamin D3 to 2000 IU daily since Feb 2025  # Medication monitoring Interventions: -- Lipid profile 01/28/24 wnl -- Hgb A1c 02/27/23 wnl, f/u repeat A1c  -- While on Invega , experienced elevated PRL levels: 142 (04/12/21) --> 108 (09/25/21) --> 50.6 (01/25/22) -> 35.2 (06/22/22) -> 23.2 (02/27/23) -- Due to normalization of PRL and resolution of symptoms, will not continue to check unless signs/sx of hyperPRL re-emerge  Patient was given contact information for behavioral health clinic and was instructed to call 911 for emergencies.   Subjective:  Chief Complaint:  Med management for schizoaffective disorder  Interval History:  Patient reports mood is fine. She reports she has been doing well. She reports she has not been feeling anxious or increased depressive symptoms. She reports irritability has continued to improve. She gets up early in the morning and journals, read the news, showers, makes her meals. She has a different chore each day of the week, does daily maintenance for the house. She has good relationship with her mom.  She reports continued AH, reports they are about the  same, still derogatory voices though they have not gotten worse. She continues to take the abilify  2.5mg  nightly, reports no issues with abilify , feels like it helps her with sleeping through the night. She reports concern about increasing medication due to brain fog and sedation and she feels that the side effects can be worse than having the hallucination. She has care manager through [employer] and using transportation to doctor's visits. She is working towards independence. Patient reports stable sleep and appetite. Patient reports adherence with medications. Patient reports no side effects. Patient reports no new substance use. Patient denies SI/HI.   Visit Diagnosis:    ICD-10-CM   1. Schizoaffective disorder, depressive type (HCC)  F25.1 ARIPiprazole  (ABILIFY ) 5 MG tablet    2. Medication monitoring encounter  Z51.81 Hemoglobin A1c      Past Psychiatric History:  Diagnoses: schizoaffective disorder, depressive type Medication trials:  Invega  PO (dizziness, abd pain, hyperPRL); Invega  Trinza (hyperPRL with galactorrhea) Hospitalizations: last psychiatric hospitalization in 2011 Suicide attempts: denies SIB: denies Current access to guns: denies Substance use: denies use of etoh, tobacco, or illicit drugs Living: lives with her mother; patient manages medications independently.  Past Medical History:  Past Medical History:  Diagnosis Date   Hypertension    Schizoaffective disorder (HCC) 2003    Past Surgical History:  Procedure Laterality Date   FINGER SURGERY  1983   Family Psychiatric History: denies  Family History:  Family History  Problem Relation Age of Onset   Hyperlipidemia Mother    Hypertension Mother    Hyperlipidemia Father    Hypertension Father    Heart disease Father    Breast cancer Sister     Social History:  Social History   Socioeconomic History   Marital status: Divorced    Spouse name: Not on file   Number of children: Not on file   Years of  education: Not on file   Highest education level: Some college, no degree  Occupational History   Not on file  Tobacco Use   Smoking status: Never   Smokeless tobacco: Never  Vaping Use   Vaping status: Never Used  Substance and Sexual Activity   Alcohol use: No   Drug use: No   Sexual activity: Not Currently  Other Topics Concern   Not on file  Social History Narrative   Not on file   Social Drivers of Health   Tobacco Use: Low Risk (01/28/2024)   Patient History    Smoking Tobacco Use: Never    Smokeless Tobacco Use: Never    Passive Exposure: Not on file  Financial Resource Strain: Low Risk (01/24/2024)   Overall Financial Resource Strain (CARDIA)    Difficulty of Paying Living Expenses: Not very hard  Food Insecurity: No Food Insecurity (01/24/2024)   Epic    Worried About Programme Researcher, Broadcasting/film/video in the Last Year: Never true    Ran Out of Food in the Last Year: Never true  Transportation Needs: No Transportation Needs (01/24/2024)   Epic    Lack of Transportation (Medical): No    Lack of Transportation (Non-Medical): No  Physical Activity: Insufficiently Active (01/24/2024)   Exercise Vital Sign    Days of Exercise per Week: 5 days    Minutes of Exercise per Session: 10 min  Stress: No Stress Concern Present (01/24/2024)   [employer] of Occupational Health - Occupational Stress Questionnaire    Feeling of Stress: Not at  all  Social Connections: Unknown (01/24/2024)   Social Connection and Isolation Panel    Frequency of Communication with Friends and Family: Once a week    Frequency of Social Gatherings with Friends and Family: Patient declined    Attends Religious Services: 1 to 4 times per year    Active Member of Clubs or Organizations: Yes    Attends Banker Meetings: 1 to 4 times per year    Marital Status: Divorced  Depression (PHQ2-9): Low Risk (01/28/2024)   Depression (PHQ2-9)    PHQ-2 Score: 0  Alcohol Screen: Low Risk (04/16/2023)    Alcohol Screen    Last Alcohol Screening Score (AUDIT): 0  Housing: Low Risk (01/24/2024)   Epic    Unable to Pay for Housing in the Last Year: No    Number of Times Moved in the Last Year: 0    Homeless in the Last Year: No  Utilities: Not At Risk (04/16/2023)   AHC Utilities    Threatened with loss of utilities: No  Health Literacy: Adequate Health Literacy (04/16/2023)   B1300 Health Literacy    Frequency of need for help with medical instructions: Never    Allergies: No Known Allergies  Current Medications: Current Outpatient Medications  Medication Sig Dispense Refill   amLODipine  (NORVASC ) 10 MG tablet Take 1 tablet (10 mg total) by mouth daily. 90 tablet 2   ARIPiprazole  (ABILIFY ) 5 MG tablet Take 0.5 tablets (2.5 mg total) by mouth at bedtime. 45 tablet 0   atorvastatin  (LIPITOR) 10 MG tablet Take 1 tablet (10 mg total) by mouth daily. 90 tablet 2   Cholecalciferol  (VITAMIN D3) 50 MCG (2000 UT) TABS Take 1 tablet (2,000 Units total) by mouth daily. 30 tablet 5   No current facility-administered medications for this visit.    ROS: Denies physical complaints; reports BM daily and denies constipation  Objective:  Psychiatric Specialty Exam: There were no vitals taken for this visit.There is no height or weight on file to calculate BMI.  General Appearance: Casual and Well Groomed  Eye Contact:  Good  Speech:  Clear and Coherent and Normal Rate  Volume:  Normal  Mood:  good  Affect:  Constricted and Euthymic; pleasant  Thought Content: Endorses daily self-derogatory AH that remain at baseline; denies CAH. Denies VH.   Suicidal Thoughts:  No  Homicidal Thoughts:  No  Thought Process:  Goal Directed and Linear  Orientation:  Full (Time, Place, and Person)    Memory:  Grossly intact  Judgment:  Good  Insight:  Fair  Concentration:  Concentration: Good  Recall:  NA  Fund of Knowledge: Good  Language: Good  Psychomotor Activity:  Normal  Akathisia:  No  AIMS (if  indicated): not done  Assets:  Communication Skills Desire for Improvement Housing Leisure Time Physical Health Social Support Transportation  ADL's:  Intact  Cognition: WNL  Sleep:  Good   PE: General: sits comfortably in view of camera; no acute distress  Pulm: no increased work of breathing on room air  MSK: all extremity movements appear intact  Neuro: no focal neurological deficits observed  Gait & Station: unable to assess by video   Metabolic Disorder Labs: Lab Results  Component Value Date   HGBA1C 4.9 02/27/2023   Lab Results  Component Value Date   PROLACTIN 23.2 02/27/2023   PROLACTIN 35.2 (H) 06/22/2022   Lab Results  Component Value Date   CHOL 193 01/28/2024   TRIG 56 01/28/2024  HDL 94 01/28/2024   CHOLHDL 2.1 01/28/2024   VLDL 15 10/17/2015   LDLCALC 88 01/28/2024   LDLCALC 70 02/27/2023   Lab Results  Component Value Date   TSH 2.310 06/22/2022   TSH 1.750 04/01/2018    Therapeutic Level Labs: No results found for: LITHIUM No results found for: VALPROATE No results found for: CBMZ  Screenings: AIMS    Flowsheet Row Office Visit from 05/30/2021 in [facility]  AIMS Total Score 0   GAD-7    Flowsheet Row Office Visit from 01/28/2024 in [facility] [place] - A Dept Of [place]. [facility] Office Visit from 04/16/2023 in [facility] - A Dept Of [place]. [facility] Counselor from 01/17/2023 in [facility] Office Visit from 12/11/2022 in [facility] [place] - A Dept Of [place]. [facility] Office Visit from 07/31/2022 in [facility] - A Dept Of [place] [facility]. [facility]  Total GAD-7 Score 0 1 0 0 1   PHQ2-9    Flowsheet Row Office Visit from 01/28/2024 in [facility] [place] - A Dept Of [place]. [facility] Office Visit from 09/26/2023 in [facility]  [place] - A Dept Of [place] [facility]. [facility] Office Visit from 04/16/2023 in [facility] - A Dept Of [place] [facility]. [facility] Counselor from 01/17/2023 in [facility] Office Visit from 12/11/2022 in [facility] [place] - A Dept Of [place]. [facility]  PHQ-2 Total Score 0 0 0 0 0  PHQ-9 Total Score 0 0 0 -- 0   Flowsheet Row Counselor from 01/17/2023 in [facility]  C-SSRS RISK CATEGORY No Risk    Collaboration of Care: Collaboration of Care: Medication Management AEB attending MD and Psychiatrist AEB established with this provider  Patient/Guardian was advised Release of Information must be obtained prior to any record release in order to collaborate their care with an outside provider. Patient/Guardian was advised if they have not already done so to contact the registration department to sign all necessary forms in order for us  to release information regarding their care.   Consent: Patient/Guardian gives verbal consent for treatment and assignment of benefits for services provided during this visit. Patient/Guardian expressed understanding and agreed to proceed.   Sandra Minor, MD, PGY-3 02/27/2024, 2:20 PM

## 2024-02-27 ENCOUNTER — Telehealth (INDEPENDENT_AMBULATORY_CARE_PROVIDER_SITE_OTHER): Payer: MEDICAID | Admitting: Psychiatry

## 2024-02-27 ENCOUNTER — Other Ambulatory Visit: Payer: Self-pay

## 2024-02-27 DIAGNOSIS — F251 Schizoaffective disorder, depressive type: Secondary | ICD-10-CM

## 2024-02-27 DIAGNOSIS — Z5181 Encounter for therapeutic drug level monitoring: Secondary | ICD-10-CM | POA: Diagnosis not present

## 2024-02-27 MED ORDER — ARIPIPRAZOLE 5 MG PO TABS
2.5000 mg | ORAL_TABLET | Freq: Every evening | ORAL | 0 refills | Status: AC
  Filled 2024-02-27 – 2024-03-11 (×2): qty 45, 90d supply, fill #0

## 2024-03-11 ENCOUNTER — Other Ambulatory Visit: Payer: Self-pay

## 2024-03-13 ENCOUNTER — Other Ambulatory Visit: Payer: Self-pay

## 2024-03-24 ENCOUNTER — Other Ambulatory Visit: Payer: Self-pay | Admitting: Child and Adolescent Psychiatry

## 2024-03-25 LAB — HEMOGLOBIN A1C
Est. average glucose Bld gHb Est-mCnc: 88 mg/dL
Hgb A1c MFr Bld: 4.7 % — ABNORMAL LOW (ref 4.8–5.6)

## 2024-05-19 ENCOUNTER — Telehealth (HOSPITAL_COMMUNITY): Payer: MEDICAID | Admitting: Psychiatry

## 2024-06-02 ENCOUNTER — Ambulatory Visit: Payer: MEDICAID | Admitting: Internal Medicine
